# Patient Record
Sex: Female | Born: 1985 | Race: White | Hispanic: No | Marital: Single | Smoking: Current every day smoker
Health system: Southern US, Community
[De-identification: ages and names within clinical notes are randomized; demographics above are authoritative.]

## PROBLEM LIST (undated history)

## (undated) ENCOUNTER — Inpatient Hospital Stay (HOSPITAL_COMMUNITY): Payer: Self-pay

## (undated) DIAGNOSIS — M797 Fibromyalgia: Secondary | ICD-10-CM

## (undated) DIAGNOSIS — I10 Essential (primary) hypertension: Secondary | ICD-10-CM

## (undated) DIAGNOSIS — IMO0002 Reserved for concepts with insufficient information to code with codable children: Secondary | ICD-10-CM

## (undated) DIAGNOSIS — N83209 Unspecified ovarian cyst, unspecified side: Secondary | ICD-10-CM

## (undated) DIAGNOSIS — Z789 Other specified health status: Secondary | ICD-10-CM

## (undated) HISTORY — DX: Other specified health status: Z78.9

## (undated) HISTORY — DX: Unspecified ovarian cyst, unspecified side: N83.209

---
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
## 2004-02-23 ENCOUNTER — Emergency Department (HOSPITAL_COMMUNITY): Admission: EM | Admit: 2004-02-23 | Discharge: 2004-02-23 | Payer: Self-pay | Admitting: Emergency Medicine

## 2005-09-24 ENCOUNTER — Emergency Department (HOSPITAL_COMMUNITY): Admission: EM | Admit: 2005-09-24 | Discharge: 2005-09-25 | Payer: Self-pay | Admitting: Emergency Medicine

## 2006-04-05 ENCOUNTER — Inpatient Hospital Stay (HOSPITAL_COMMUNITY): Admission: EM | Admit: 2006-04-05 | Discharge: 2006-04-07 | Payer: Self-pay | Admitting: Emergency Medicine

## 2006-04-07 ENCOUNTER — Inpatient Hospital Stay (HOSPITAL_COMMUNITY): Admission: AD | Admit: 2006-04-07 | Discharge: 2006-04-11 | Payer: Self-pay | Admitting: Psychiatry

## 2006-04-08 ENCOUNTER — Ambulatory Visit: Payer: Self-pay | Admitting: Psychiatry

## 2007-09-06 ENCOUNTER — Inpatient Hospital Stay (HOSPITAL_COMMUNITY): Admission: AD | Admit: 2007-09-06 | Discharge: 2007-09-06 | Payer: Self-pay | Admitting: Obstetrics & Gynecology

## 2007-11-09 ENCOUNTER — Inpatient Hospital Stay (HOSPITAL_COMMUNITY): Admission: AD | Admit: 2007-11-09 | Discharge: 2007-11-09 | Payer: Self-pay | Admitting: Obstetrics & Gynecology

## 2007-12-20 ENCOUNTER — Inpatient Hospital Stay (HOSPITAL_COMMUNITY): Admission: AD | Admit: 2007-12-20 | Discharge: 2007-12-20 | Payer: Self-pay | Admitting: Obstetrics & Gynecology

## 2007-12-20 ENCOUNTER — Ambulatory Visit: Payer: Self-pay | Admitting: Obstetrics and Gynecology

## 2008-04-05 ENCOUNTER — Ambulatory Visit: Payer: Self-pay | Admitting: Family Medicine

## 2008-04-05 ENCOUNTER — Inpatient Hospital Stay (HOSPITAL_COMMUNITY): Admission: AD | Admit: 2008-04-05 | Discharge: 2008-04-05 | Payer: Self-pay | Admitting: Obstetrics & Gynecology

## 2008-04-11 ENCOUNTER — Inpatient Hospital Stay (HOSPITAL_COMMUNITY): Admission: AD | Admit: 2008-04-11 | Discharge: 2008-04-11 | Payer: Self-pay | Admitting: Obstetrics & Gynecology

## 2008-04-11 ENCOUNTER — Ambulatory Visit: Payer: Self-pay | Admitting: Family Medicine

## 2008-04-12 ENCOUNTER — Inpatient Hospital Stay (HOSPITAL_COMMUNITY): Admission: AD | Admit: 2008-04-12 | Discharge: 2008-04-12 | Payer: Self-pay | Admitting: Obstetrics & Gynecology

## 2008-04-12 ENCOUNTER — Ambulatory Visit: Payer: Self-pay | Admitting: Physician Assistant

## 2008-04-13 ENCOUNTER — Inpatient Hospital Stay (HOSPITAL_COMMUNITY): Admission: AD | Admit: 2008-04-13 | Discharge: 2008-04-13 | Payer: Self-pay | Admitting: Obstetrics & Gynecology

## 2008-04-14 ENCOUNTER — Inpatient Hospital Stay (HOSPITAL_COMMUNITY): Admission: AD | Admit: 2008-04-14 | Discharge: 2008-04-17 | Payer: Self-pay | Admitting: Obstetrics & Gynecology

## 2008-04-14 ENCOUNTER — Ambulatory Visit: Payer: Self-pay | Admitting: Advanced Practice Midwife

## 2008-06-22 ENCOUNTER — Emergency Department (HOSPITAL_COMMUNITY): Admission: EM | Admit: 2008-06-22 | Discharge: 2008-06-22 | Payer: Self-pay | Admitting: Emergency Medicine

## 2008-08-15 ENCOUNTER — Inpatient Hospital Stay (HOSPITAL_COMMUNITY): Admission: AD | Admit: 2008-08-15 | Discharge: 2008-08-15 | Payer: Self-pay | Admitting: Obstetrics & Gynecology

## 2008-08-15 ENCOUNTER — Ambulatory Visit: Payer: Self-pay | Admitting: Obstetrics and Gynecology

## 2008-12-25 ENCOUNTER — Emergency Department (HOSPITAL_COMMUNITY): Admission: EM | Admit: 2008-12-25 | Discharge: 2008-12-25 | Payer: Self-pay | Admitting: Emergency Medicine

## 2010-02-07 ENCOUNTER — Emergency Department (HOSPITAL_COMMUNITY)
Admission: EM | Admit: 2010-02-07 | Discharge: 2010-02-07 | Payer: Self-pay | Source: Home / Self Care | Admitting: Emergency Medicine

## 2010-02-07 LAB — RAPID STREP SCREEN (MED CTR MEBANE ONLY): Streptococcus, Group A Screen (Direct): NEGATIVE

## 2010-02-07 LAB — POCT PREGNANCY, URINE: Preg Test, Ur: NEGATIVE

## 2010-05-08 LAB — WET PREP, GENITAL
Clue Cells Wet Prep HPF POC: NONE SEEN
Trich, Wet Prep: NONE SEEN
Yeast Wet Prep HPF POC: NONE SEEN

## 2010-05-08 LAB — URINALYSIS, ROUTINE W REFLEX MICROSCOPIC
Bilirubin Urine: NEGATIVE
Glucose, UA: NEGATIVE mg/dL
Hgb urine dipstick: NEGATIVE
Ketones, ur: NEGATIVE mg/dL
Nitrite: NEGATIVE
Protein, ur: NEGATIVE mg/dL
Specific Gravity, Urine: 1.006 (ref 1.005–1.030)
Urobilinogen, UA: 0.2 mg/dL (ref 0.0–1.0)
pH: 6.5 (ref 5.0–8.0)

## 2010-05-08 LAB — URINE MICROSCOPIC-ADD ON

## 2010-05-08 LAB — GC/CHLAMYDIA PROBE AMP, GENITAL
Chlamydia, DNA Probe: POSITIVE — AB
GC Probe Amp, Genital: NEGATIVE

## 2010-05-12 LAB — URINALYSIS, ROUTINE W REFLEX MICROSCOPIC
Glucose, UA: NEGATIVE mg/dL
Ketones, ur: 15 mg/dL — AB
Leukocytes, UA: NEGATIVE
Nitrite: NEGATIVE
Protein, ur: NEGATIVE mg/dL
Specific Gravity, Urine: >1.03 — ABNORMAL HIGH (ref 1.005–1.030)
Urobilinogen, UA: 0.2 mg/dL (ref 0.0–1.0)
pH: 6 (ref 5.0–8.0)

## 2010-05-12 LAB — CBC
HCT: 40.4 % (ref 36.0–46.0)
Hemoglobin: 13.9 g/dL (ref 12.0–15.0)
MCHC: 34.5 g/dL (ref 30.0–36.0)
MCV: 91.3 fL (ref 78.0–100.0)
Platelets: 227 10*3/uL (ref 150–400)
RBC: 4.42 MIL/uL (ref 3.87–5.11)
RDW: 13.4 % (ref 11.5–15.5)
WBC: 8.9 10*3/uL (ref 4.0–10.5)

## 2010-05-12 LAB — URINE MICROSCOPIC-ADD ON

## 2010-05-12 LAB — WET PREP, GENITAL
Clue Cells Wet Prep HPF POC: NONE SEEN
Trich, Wet Prep: NONE SEEN
Yeast Wet Prep HPF POC: NONE SEEN

## 2010-05-12 LAB — GC/CHLAMYDIA PROBE AMP, GENITAL
Chlamydia, DNA Probe: POSITIVE — AB
GC Probe Amp, Genital: NEGATIVE

## 2010-05-12 LAB — POCT PREGNANCY, URINE: Preg Test, Ur: NEGATIVE

## 2010-05-16 LAB — CBC
HCT: 34.9 % — ABNORMAL LOW (ref 36.0–46.0)
Hemoglobin: 11.8 g/dL — ABNORMAL LOW (ref 12.0–15.0)
MCHC: 33.9 g/dL (ref 30.0–36.0)
MCV: 92.6 fL (ref 78.0–100.0)
Platelets: 202 10*3/uL (ref 150–400)
RBC: 3.77 MIL/uL — ABNORMAL LOW (ref 3.87–5.11)
RDW: 13.8 % (ref 11.5–15.5)
WBC: 9.6 10*3/uL (ref 4.0–10.5)

## 2010-05-16 LAB — TYPE AND SCREEN
ABO/RH(D): O POS
Antibody Screen: NEGATIVE

## 2010-05-16 LAB — RAPID URINE DRUG SCREEN, HOSP PERFORMED
Amphetamines: NOT DETECTED
Benzodiazepines: NOT DETECTED
Cocaine: NOT DETECTED

## 2010-05-16 LAB — STREP B DNA PROBE: Strep Group B Ag: NEGATIVE

## 2010-05-16 LAB — RPR
RPR Ser Ql: NONREACTIVE
RPR Ser Ql: NONREACTIVE

## 2010-05-16 LAB — RUBELLA SCREEN: Rubella: 60.9 IU/mL — ABNORMAL HIGH

## 2010-05-16 LAB — HEPATITIS B SURFACE ANTIGEN: Hepatitis B Surface Ag: NEGATIVE

## 2010-06-21 NOTE — Op Note (Signed)
NAMEMESSIAH, ROVIRA               ACCOUNT NO.:  [id number]   MEDICAL RECORD NO.:  [id number]          PATIENT TYPE:  INP   LOCATION:  9134                          FACILITY:  [facility]   PHYSICIAN:  Gerrit Friends. Aldona Bar, M.D.   DATE OF BIRTH:  Aug 06, 1985   DATE OF PROCEDURE:  12/28/2003  DATE OF DISCHARGE:                                 OPERATIVE REPORT   PREOPERATIVE DIAGNOSIS:  Term pregnancy, labor with slow progress and  possible nonreassuring fetal heart tracing.   POSTOPERATIVE DIAGNOSIS:  Term pregnancy, labor with slow progress and  possible nonreassuring fetal heart tracing.  Delivery of 6 pound 13 ounce  female infant, Apgars 9 and 9.  Arterial cord pH 7.29.   OPERATION PERFORMED:  Primary low transverse cesarean section.   SURGEON:  Gerrit Friends. Aldona Bar, M.D.   ASSISTANT:  Ilda Mori, M.D.   ANESTHESIA:  Epidural.   INDICATIONS FOR PROCEDURE:  This 25 year old gravida 1 presented at term on  the afternoon of December 27, 2003 in early labor.  At that time she was  approximately 3 cm dilated.  On the evening of December 27, 2003 she  underwent amniotomy with production of clear fluid and ultimately required  Pitocin augmentation of labor but continued to make slow progress.  Her  fetal heart tracing with a scalp electrode had what looked like  hypervariability.  There were some occasional decelerations of the fetal  heart rate with and after contractions.  In general, the fetal heart tracing  did not appear totally reassuring to me and with the patient slow progress  (she was at best 6 cm at 7:30 on December 28, 2003), decision was made to  proceed with a primary low transverse cesarean section.  The patient was  taken to the operating room with her epidural and Foley catheter in place.   DESCRIPTION OF PROCEDURE:  Once in the operating room the epidural was  augmented and she was prepped and draped in the usual fashion for cesarean  section having been placed in supine position  fully tilted to the left.  Scalp electrode was removed.  After the patient was adequately draped after  her prep, good anesthetic levels were documented and the procedure was  thereafter begun.  A Pfannenstiel incision was made with minimal difficulty  and dissected down sharply to and through the fascia in low transverse  fashion with hemostasis created at each layer.  The peritoneum was  identified and entered appropriately with care taken to avoid the bowel and  bladder superiorly and inferiorly.  A bladder blade was placed,  vesicouterine peritoneum was identified and the vesicouterine peritoneum was  incised in low transverse fashion using Metzenbaum scissors and pushed off  the lower uterine segment with ease.  Sharp incisions using Metzenbaum  scissors was then carried out in low transverse fashion and extended  laterally with fingers.  Remaining amniotic fluid was clear.  Thereafter  with minimal difficulty  from a vertex position, a viable 6 pound 13 ounce  female infant which cried spontaneously as once was delivered.  There was a  nuchal  cord times one.  After the baby was delivered, the cord was clamped  and cut.  Subsequent Apgars were found to be 9 and 9.  An arterial cord pH  was obtained and was 7.29.  After other cord bloods were obtained, the  placenta was delivered intact and the placenta was sent to pathology labeled  appropriately.  Thereafter, the uterus was delivered from the abdominal  incision and with manual stimulation and intravenous Pitocin, contracted  well.  The cavity was cleansed of all remaining products of conception.  With good uterine contractility noted at this time, closure of the uterine  incision was carried out using a single layer of #1 Vicryl in a running  locking fashion.  Incision was dry, the bladder flap was reapproximated  using 2-0 Vicryl in an interrupted fashion.  Tubes and ovaries noted to be  normal.  After the abdomen was lavaged of all  free blood and clots, the  uterus was replaced in the abdominal incision and closure of the abdomen was  begun in layers after all counts were noted to be correct and no foreign  bodies were noted to be remaining in the abdominal cavity.   The abdominal peritoneum was closed with 0 Vicryl in a running fashion,  muscles secured with same.  After assuring hemostasis, the fascia was  reapproximated using 0 Vicryl from angle to midline bilaterally.  Subcutaneous tissue was rendered hemostatic and staples were then used to  close the skin.  A sterile pressure dressing was applied.  The patient at  this time was taken to the recovery room in satisfactory condition having  tolerated the procedure well.  The estimated blood loss 500 mL.  All counts  correct times two.  In summary, this patient presented with a possible  nonreassuring fetal heart tracing coupled with the fact that she was making  very slow progress during her labor in spite of Pitocin augmentation.  She  was taken to the operating room and was delivered of a 6 pound 13 ounce female  infant with Apgars of 9 and 9 and arterial cord pH of 7.29.  There was a  nuchal cord times one.  At the conclusion of the procedure, both mother and  baby were doing well in their respective recovery areas.      RMW/MEDQ  D:  12/28/2003  T:  12/28/2003  Job:  [id number]

## 2010-06-21 NOTE — Discharge Summary (Signed)
NAME:  Monique Lyons, Monique Lyons NO.:  [id number]   MEDICAL RECORD NO.:  [id number]          PATIENT TYPE:  INP   LOCATION:  9134                          FACILITY:  [facility]   PHYSICIAN:  Miguel Aschoff, M.D.       DATE OF BIRTH:  [birth year]   DATE OF ADMISSION:  12/27/2003  DATE OF DISCHARGE:  12/31/2003                                 DISCHARGE SUMMARY   FINAL DIAGNOSES:  Intrauterine pregnancy at term, labor with slow progress  and possible nonreassuring fetal heart tracing.   PROCEDURE:  Primary low transverse cesarean section.   SURGEON:  Gerrit Friends. Aldona Bar, M.D.   ASSISTANT:  Ilda Mori, M.D.   COMPLICATIONS:  None.   This 25 year old, G1, P0 presents at term on the afternoon of November 23 in  early labor. The patient was about 3 cm dilated at this time. Amniotomy was  performed with production of clear fluid. Pitocin augmentation was begun but  patient made slow progress.  Scalp electrodes were placed but showed some  hypervariability and there were some occasional decelerations of fetal heart  rate after each contraction.  Because of the slow progress and a not  completely normal fetal heart rate tracing, a decision was made to proceed  with a cesarean section. The patient was taken to the operating room on  December 28, 2003 by Gerrit Friends. Aldona Bar, M.D. where a primary low transverse  cesarean section was performed with the delivery of a 6 pound 13 ounce female  infant with Apgar's of 9 and 9, delivery went without complications. The  patient's postoperative course was benign without significant fevers.  She  was felt ready for discharge on postoperative day #3. She was sent home on a  regular diet, told to decrease activities, told to continue prenatal  vitamins and was given Tylox 1-2 every 3 hours as needed for pain. She is to  call with and increased fevers, __________ or bleeding and was to followup  in the office in 4-5 weeks.   DISCHARGE LABS:  The patient had a  hemoglobin of 10.3, white blood cell  count of 16.5.      MB/MEDQ  D:  02/01/2004  T:  02/01/2004  Job:  [id number]

## 2010-06-21 NOTE — Discharge Summary (Signed)
Monique Lyons, Monique Lyons               ACCOUNT NO.:  [id number]   MEDICAL RECORD NO.:  [id number]          PATIENT TYPE:  INP   LOCATION:  2005                         FACILITY:  [facility]   PHYSICIAN:  Kela Millin, M.D.DATE OF BIRTH:  [birth year]   DATE OF ADMISSION:  04/05/2006  DATE OF DISCHARGE:                               DISCHARGE SUMMARY   DISCHARGE DIAGNOSES:  1. Tylenol overdose.  2. Major depression/suicide attempt.  3. Recent history of Chlamydia infection.  4. History of ovarian cyst.   CONSULTATIONS:  Psychiatry, Dr. Jeanie Sewer.   HISTORY:  The patient is a 25 year old white female with past medical  history significant for previous suicide attempt who presented following  a Tylenol overdose.  The patient family reported that at 7:30 p.m. on  the day of admission the patient took four Tylenol PM capsules along  with a small amount of liquid Tylenol in a suicide attempt.  She  reported that she was trying to kill herself.  She had also inflicted a  superficial, horizontal laceration to her right wrist.  In the ER the  patient was initially slightly somnolent and appeared to be withdrawn  more than anything.  Lab work in the ER revealed normal transaminases as  well as salicylate level and Poison Control recommended doing a four-  hour post ingestion of Tylenol level and this was done and it was mildly  elevated at 58.2, and another repeat level was recommended.  The  patient's complained of mild headache and denied any other complaints  and already mentioned was noted to be very withdrawn.   Physical examination by Dr. Rito Ehrlich revealed a temperature of 98.7,  pulse 88, blood pressure 140/97, respiratory rate 12, oxygen saturation  99 on room air.  Pertinent findings on exam showed mucous membranes  slightly dry.  Upper extremities show superficial lacerations on her  right wrist with a dressing applied in the ER.   LABORATORY DATA:  Salicylate level was less than 4.   Alcohol level  normal.  Urine drug screen negative.  White cell count of 15.4 with no  shifts.  Hemoglobin 14.7.  Hematocrit 42.6.  Platelet count 285,000.  LFTs were unremarkable.  Point-of-care markers also unremarkable.  Tylenol level 58.2 as above.   HOSPITAL COURSE:  Problem #1.  Tylenol overdose.  Upon admission, Poison  Control was contacted as already discussed and they recommended a four-  hour level which was done and it was mildly elevated at 58.2.  Poison  Control stated that, though elevated, this level did not require  Mucomyst but that another four-hour level be done and that if it was  increased, the patient should be started on Mucomyst.  The recommended  level was done and it was less than 10, no Mucomyst was given.  The  patient's LFTs/transaminases and bilirubin also were repeated and  remained within normal limits.  Psychiatry was consulted and Dr.  Jeanie Sewer saw the patient and recommended inpatient psychiatry admission  as he stated that the patient was still a suicide risk.   Problem #2.  Major depression/suicide attempt.  As  already mentioned,  this was the patient's second suicide attempt and psychiatry followup  following evaluation as stated the patient is still a suicide risk and  recommended inpatient psychiatry admission.  The patient is medically  cleared at this time and is awaiting a bed in a psychiatric facility.  The patient is still on suicide precautions at this time with one on one  observation.   Problem #3.  Recent Chlamydia infection.  The patient reported while in  the hospital that she had been diagnosed with Chlamydia infection a  month ago at the [facility] and was treated at that time.  She  also reported that she was having some of the same symptoms that she had  had at the time that she was diagnosed although she denied any vaginal  discharge.  She stated that her partner had been treated at the time as  well but she was unsure  if he had become reinfected.  The patient was  empirically treated with Rocephin and started doxycycline. She will need  a total of seven days of doxycycline 100 mg p.o. b.i.d.  She has  remained afebrile.  The patient's partner is also to be treated although  the patient indicates that she has broken up with the partner at this  time.   Problem #4.  History of ovarian cyst.  The patient to continue p.r.n.  Motrin and outpatient followup with her primary care physician when  discharged from inpatient psychiatry.   DISCHARGE MEDICATIONS:  1. Doxycycline 100 mg p.o. b.i.d. to complete a total of seven days.  2. Motrin 400 mg p.o. t.i.d. p.r.n. with food.   FOLLOW UP:  The patient will be transferred to inpatient psychiatry.  Follow up with primary care physician as scheduled.      Kela Millin, M.D.  Electronically Signed     ACV/MEDQ  D:  04/07/2006  T:  04/07/2006  Job:  [id number]   cc:   Miguel Aschoff, M.D.

## 2010-06-21 NOTE — H&P (Signed)
Monique Lyons, CLASBY NO.:  [id number]   MEDICAL RECORD NO.:  [id number]          PATIENT TYPE:  EMS   LOCATION:  MAJO                         FACILITY:  [facility]   PHYSICIAN:  Hollice Espy, M.D.DATE OF BIRTH:  16-Dec-1985   DATE OF ADMISSION:  04/05/2006  DATE OF DISCHARGE:                              HISTORY & PHYSICAL   PRIMARY CARE PHYSICIAN:  C. Duane Lope, M.D.   CONSULTATIONS:  Antonietta Breach, M.D., psychiatry.   CHIEF COMPLAINT:  Tylenol overdose.   HISTORY OF PRESENT ILLNESS:  The patient is a 25 year old white female  with past medical history of previous suicide attempts, who according to  the family about 7:30 this evening took four Tylenol PM capsules along  with a small amount of liquid Tylenol in an attempt.  She said she was  trying to kill herself.  She also inflicted a superficial horizontal  laceration on her right wrist.  Her family brought her into the  emergency room.  The patient initially was slightly somnolent, but she  appeared to be more withdrawn than anything.  Labs were ordered on the  patient and she was noted to have an elevated white count of 15.4 with  the rest of her lab work and transaminases, electrolytes, salicylate  levels, and urine drug screen were negative.  As per poison control  recommendations, initial Tylenol level has not been done yet and needs  to be done 4 hours post ingestion, of which there is still 1 hour left  before that is done.  The patient in the meantime, however, is alert and  awake, however, very withdrawn, saying only a few words.  She complains  of some mild shortness of breath and does complain of a mild headache.  She tells me that she is not really hurting anywhere.  She denies any  abdominal pain and no nausea and vomiting.  She does not give me much of  review of systems, other than that secondary to be very withdrawn.   PAST MEDICAL HISTORY:  Previous multiple suicide attempts.   MEDICATIONS:  She is on p.r.n. Motrin.   ALLERGIES:  PENICILLIN.   FAMILY HISTORY:  Noncontributory.   PHYSICAL EXAMINATION:  VITAL SIGNS:  Temperature 98.7, heart rate 88,  blood pressure 140/97, respirations 12, O2 saturation 99% on room air.  GENERAL:  The patient is alert and oriented x3.  HEENT:  Normocephalic and atraumatic.  Mucous membranes are slightly  dry.  She has no carotid bruits.  HEART:  Regular rate and rhythm.  S1 and S2.  LUNGS:  Clear to auscultation bilaterally.  ABDOMEN:  Soft, nontender, and nondistended with positive bowel sounds.  EXTREMITIES:  She has a superficial laceration on her wrist which has  been dressed by ER.  No cyanosis, clubbing, or edema.   LABORATORY DATA:  Salicylate level and alcohol levels are normal.  Urine  drug screen normal.  White count 15.4 with no shift.  H&H 14.7 and 42.6,  MCV 89, platelet count 285.  LFT's are unremarkable.  Cardiac markers  are unremarkable.  Serum drug screen is  pending.  Tylenol level is due  in 45 minutes, which will make a 4-hour post ingestion marker.   ASSESSMENT:  1. Intentional Tylenol overdose.  I have spoken with behavioral health      who will consult Dr. Jeanie Sewer, psychiatry to see her.  In the      meantime, we will put her on suicide precautions and pending      Tylenol level, we will treat with the Mucomyst, although it appears      from what the family and the patient describe that she has taken      very little Tylenol ingestion.  She may require more monitoring.      We will defer all attempts at psychotropic and behavioral health      intervention to behavioral health.  2. Leukocytosis likely secondary to #1.      Hollice Espy, M.D.  Electronically Signed     SKK/MEDQ  D:  04/05/2006  T:  04/05/2006  Job:  [id number]   cc:   Antonietta Breach, M.D.  Vianne Bulls, M.D.

## 2010-06-21 NOTE — H&P (Signed)
NAME:  Monique Lyons, Monique Lyons NO.:  [id number]   MEDICAL RECORD NO.:  [id number]          PATIENT TYPE:  IPS   LOCATION:  0302                          FACILITY:  [facility]   PHYSICIAN:  Anselm Jungling, MD  DATE OF BIRTH:  1985-06-08   DATE OF ADMISSION:  04/07/2006  DATE OF DISCHARGE:                       PSYCHIATRIC ADMISSION ASSESSMENT   HISTORY OF PRESENT ILLNESS:  The patient presents with a history of self-  inflicted injury cutting herself with a piece of glass and a knife with  laceration requiring stitches.  The patient also overdosed on four  Tylenol tablets with her intention to kill herself.  She reports she has  been having problems with her boyfriend who left her with her 2-year-  old.  Boyfriend is the father of this child.  She states because he was  unable to provide financially for them.  She is herself also unable to  work due to transportation issues.  She is glad that she is here, that  there are people who understand her, she promises safety on the unit.  Denies any substance abuse issues.   PAST PSYCHIATRIC HISTORY:  First admission to [facility].  Has a history of cutting in the past, but states it was more for  attention.   SOCIAL HISTORY:  She is a 25 year old single white female.  Has a 2-year-  old son.  She lives with her son.  She is unemployed.  The child is  currently with her boyfriend's mother whom she gets along well with.  No  legal problems.  The patient has a supportive sister and father.   FAMILY HISTORY:  None.   ALCOHOL AND DRUG HISTORY:  The patient smokes.  Denies any alcohol or  drug use.   PRIMARY CARE Elyse Prevo:  None.   MEDICAL PROBLEMS:  None.   MEDICATIONS:  Takes vitamins and occasional Motrin or aspirin for  headaches.   DRUG ALLERGIES:  AMOXICILLIN.   PHYSICAL EXAMINATION:  The patient was fully assessed at [facility] after her overdose.  She presents today.  She is a young  female.   She has a horizontal laceration with three sutures in place.  There is no redness.  No bleeding to her left wrist pain.   LABORATORY DATA:  Her CBC is within normal.  Her C-MET is within normal  limits.  Her pregnancy test is negative.  Urine drug screen is negative.  Her acetaminophen level was initially high at 58.2, down to 10 prior to  transfer.  Salicylate level less than 4.  Alcohol level was less than 5.   VITAL SIGNS:  Temperature is 98.5, 54 heart rate, 18 respirations, blood  pressure 118/72, 5 feet 9 inches tall, 153 pounds.   MENTAL STATUS EXAM:  She is fully alert, cooperative, fair eye contact.  She is casually dressed.  Her speech is clear and normal pace and tone.  The patient's mood is neutral.  The patient is very appreciative of help  she has been receiving.  Very pleasant.  Thought processes are coherent.  No evidence of psychosis.  Cognitive intact.  Memory is good.  Judgment  is fair.  Insight is fair.   IMPRESSION:  AXIS I:  Major depressive disorder with status post  overdose.  AXIS II:  Deferred.  AXIS III:  1. Chlamydia infection.  2. Acetaminophen overdose.  AXIS IV:  Problems with primary support group, occupation, other  psychosocial problems, problems with economic issues.  AXIS V:  Current is 35.   PLAN:  Contract for safety.  Stabilize mood and thinking.  We will  continue with a week's course of doxycycline for STD.  We will monitor  her wound for signs and symptoms of infection.  Assess wound in 7 days  for suture removal.  Will have a family session with the patient's  support group.  Case manager will obtain her follow-up.  Tentative  length of stay is 3-4 days.      Landry Corporal, N.P.      Anselm Jungling, MD  Electronically Signed    JO/MEDQ  D:  04/08/2006  T:  04/08/2006  Job:  [id number]

## 2010-08-05 ENCOUNTER — Emergency Department (HOSPITAL_COMMUNITY)
Admission: EM | Admit: 2010-08-05 | Discharge: 2010-08-06 | Disposition: A | Discharge status: Home / Self Care | Payer: Medicaid Other | Attending: Emergency Medicine | Admitting: Emergency Medicine

## 2010-08-05 DIAGNOSIS — F3130 Bipolar disorder, current episode depressed, mild or moderate severity, unspecified: Secondary | ICD-10-CM | POA: Insufficient documentation

## 2010-08-05 DIAGNOSIS — R109 Unspecified abdominal pain: Secondary | ICD-10-CM | POA: Insufficient documentation

## 2010-08-05 DIAGNOSIS — R110 Nausea: Secondary | ICD-10-CM | POA: Insufficient documentation

## 2010-08-05 DIAGNOSIS — R42 Dizziness and giddiness: Secondary | ICD-10-CM | POA: Insufficient documentation

## 2010-08-05 DIAGNOSIS — R404 Transient alteration of awareness: Secondary | ICD-10-CM | POA: Insufficient documentation

## 2010-08-05 DIAGNOSIS — O99891 Other specified diseases and conditions complicating pregnancy: Secondary | ICD-10-CM | POA: Insufficient documentation

## 2010-08-05 DIAGNOSIS — N898 Other specified noninflammatory disorders of vagina: Secondary | ICD-10-CM | POA: Insufficient documentation

## 2010-08-05 LAB — DIFFERENTIAL
Basophils Relative: 0 % (ref 0–1)
Eosinophils Absolute: 0.1 10*3/uL (ref 0.0–0.7)
Monocytes Absolute: 0.6 10*3/uL (ref 0.1–1.0)
Monocytes Relative: 6 % (ref 3–12)
Neutrophils Relative %: 69 % (ref 43–77)

## 2010-08-05 LAB — URINALYSIS, ROUTINE W REFLEX MICROSCOPIC
Ketones, ur: 15 mg/dL — AB
Leukocytes, UA: NEGATIVE
Nitrite: NEGATIVE
Protein, ur: NEGATIVE mg/dL

## 2010-08-05 LAB — BASIC METABOLIC PANEL
Calcium: 9.2 mg/dL (ref 8.4–10.5)
Creatinine, Ser: <0.47 mg/dL — ABNORMAL LOW (ref 0.50–1.10)

## 2010-08-05 LAB — CBC
MCH: 30.2 pg (ref 26.0–34.0)
MCHC: 34.2 g/dL (ref 30.0–36.0)
Platelets: 207 10*3/uL (ref 150–400)
RBC: 4.43 MIL/uL (ref 3.87–5.11)

## 2010-08-05 LAB — POCT PREGNANCY, URINE: Preg Test, Ur: POSITIVE

## 2010-08-06 ENCOUNTER — Emergency Department (HOSPITAL_COMMUNITY): Payer: Medicaid Other

## 2010-08-06 LAB — GC/CHLAMYDIA PROBE AMP, GENITAL: GC Probe Amp, Genital: NEGATIVE

## 2010-08-06 LAB — ABO/RH: ABO/RH(D): O POS

## 2010-11-01 LAB — POCT PREGNANCY, URINE: Preg Test, Ur: POSITIVE

## 2010-11-04 LAB — CBC
MCV: 93.8
RBC: 3.69 — ABNORMAL LOW
WBC: 11.6 — ABNORMAL HIGH

## 2010-11-04 LAB — URINALYSIS, ROUTINE W REFLEX MICROSCOPIC
Glucose, UA: NEGATIVE
Ketones, ur: NEGATIVE
Protein, ur: NEGATIVE

## 2010-11-04 LAB — WET PREP, GENITAL

## 2010-11-04 LAB — GC/CHLAMYDIA PROBE AMP, GENITAL: Chlamydia, DNA Probe: NEGATIVE

## 2010-11-05 LAB — URINALYSIS, ROUTINE W REFLEX MICROSCOPIC
Glucose, UA: NEGATIVE
Ketones, ur: 40 — AB
Specific Gravity, Urine: 1.01
pH: 8.5 — ABNORMAL HIGH

## 2010-11-05 LAB — DIFFERENTIAL
Basophils Absolute: 0
Lymphocytes Relative: 16
Lymphs Abs: 1.9
Neutro Abs: 9.2 — ABNORMAL HIGH
Neutrophils Relative %: 78 — ABNORMAL HIGH

## 2010-11-05 LAB — KLEIHAUER-BETKE STAIN: Quantitation Fetal Hemoglobin: 0

## 2010-11-05 LAB — CBC
Platelets: 229
RDW: 12.9
WBC: 11.7 — ABNORMAL HIGH

## 2010-11-05 LAB — WET PREP, GENITAL: Clue Cells Wet Prep HPF POC: NONE SEEN

## 2010-11-05 LAB — TYPE AND SCREEN
ABO/RH(D): O POS
Antibody Screen: NEGATIVE

## 2010-11-05 LAB — RPR: RPR Ser Ql: NONREACTIVE

## 2010-11-05 LAB — HIV ANTIBODY (ROUTINE TESTING W REFLEX): HIV: NONREACTIVE

## 2010-11-05 LAB — URINE MICROSCOPIC-ADD ON

## 2010-11-05 LAB — HEPATITIS B SURFACE ANTIGEN: Hepatitis B Surface Ag: NEGATIVE

## 2010-11-05 LAB — RUBELLA SCREEN: Rubella: 61.9 — ABNORMAL HIGH

## 2010-11-05 LAB — ABO/RH: ABO/RH(D): O POS

## 2010-11-05 LAB — GC/CHLAMYDIA PROBE AMP, GENITAL: Chlamydia, DNA Probe: NEGATIVE

## 2010-12-15 ENCOUNTER — Encounter: Payer: Self-pay | Admitting: Emergency Medicine

## 2010-12-15 ENCOUNTER — Emergency Department (HOSPITAL_COMMUNITY)
Admission: EM | Admit: 2010-12-15 | Discharge: 2010-12-15 | Disposition: A | Discharge status: Home / Self Care | Payer: Medicaid Other | Attending: Emergency Medicine | Admitting: Emergency Medicine

## 2010-12-15 DIAGNOSIS — O99891 Other specified diseases and conditions complicating pregnancy: Secondary | ICD-10-CM | POA: Insufficient documentation

## 2010-12-15 DIAGNOSIS — H538 Other visual disturbances: Secondary | ICD-10-CM | POA: Insufficient documentation

## 2010-12-15 DIAGNOSIS — R42 Dizziness and giddiness: Secondary | ICD-10-CM | POA: Insufficient documentation

## 2010-12-15 DIAGNOSIS — F17200 Nicotine dependence, unspecified, uncomplicated: Secondary | ICD-10-CM | POA: Insufficient documentation

## 2010-12-15 LAB — COMPREHENSIVE METABOLIC PANEL
ALT: 13 U/L (ref 0–35)
AST: 15 U/L (ref 0–37)
Albumin: 2.8 g/dL — ABNORMAL LOW (ref 3.5–5.2)
Calcium: 8.4 mg/dL (ref 8.4–10.5)
Chloride: 105 mEq/L (ref 96–112)
Creatinine, Ser: 0.54 mg/dL (ref 0.50–1.10)
Sodium: 137 mEq/L (ref 135–145)

## 2010-12-15 LAB — PROTEIN / CREATININE RATIO, URINE
Creatinine, Urine: 132.02 mg/dL
Total Protein, Urine: 8.4 mg/dL

## 2010-12-15 LAB — CBC
MCH: 30 pg (ref 26.0–34.0)
MCV: 90.4 fL (ref 78.0–100.0)
Platelets: 221 10*3/uL (ref 150–400)
RBC: 3.63 MIL/uL — ABNORMAL LOW (ref 3.87–5.11)
RDW: 12.9 % (ref 11.5–15.5)
WBC: 9.4 10*3/uL (ref 4.0–10.5)

## 2010-12-15 LAB — URINALYSIS, ROUTINE W REFLEX MICROSCOPIC
Glucose, UA: NEGATIVE mg/dL
Hgb urine dipstick: NEGATIVE
Leukocytes, UA: NEGATIVE
pH: 7.5 (ref 5.0–8.0)

## 2010-12-15 NOTE — ED Provider Notes (Signed)
History     CSN: [id number] Arrival date & time: 12/15/2010 11:58 AM   First MD Initiated Contact with Patient 12/15/10 1227      Chief Complaint  Patient presents with  . Blurred Vision  . Dizziness    (Consider location/radiation/quality/duration/timing/severity/associated sxs/prior treatment) HPI This G3P2 female now presents with concerns of blurred vision. She notes that her last menstrual period was about 7 months ago, and has had one ER visit she has had no prenatal care for this pregnancy. She now presents with concerns of her visual changes. She states that her changes began gradually 2 weeks ago, and has been more present over the interval.. She describes vision, particularly with prolonged watching of TV. No loss of vision, no pain with eye motion or light. No headache, no disorientation. No clear alleviating or exacerbating factors. The patient also complains of intermittent cramps preeminently in her legs. No fever, no chills, no vaginal discharge, vaginal bleeding, no sustained intermittent contractions upper abdomen. History reviewed. No pertinent past medical history.  Past Surgical History  Procedure Date  . Cesarean section     first was c-section; second vbac    History reviewed. No pertinent family history.  History  Substance Use Topics  . Smoking status: Current Some Day Smoker  . Smokeless tobacco: Not on file  . Alcohol Use: No    OB History    Grav Para Term Preterm Abortions TAB SAB Ect Mult Living   4 2 2  1     2       Review of Systems  All other systems reviewed and are negative.    Allergies  Amoxicillin and Penicillins  Home Medications  No current outpatient prescriptions on file.  BP 107/64  Pulse 73  Temp(Src) 98 F (36.7 C) (Oral)  Resp 15  SpO2 96%  Physical Exam  Constitutional: She is oriented to person, place, and time. She appears well-developed and well-nourished. No distress.  HENT:  Head: Normocephalic and  atraumatic.  Eyes: Conjunctivae and EOM are normal. Pupils are equal, round, and reactive to light. Right eye exhibits no discharge. Left eye exhibits no discharge. No scleral icterus.       Acuity is normal  Neck: Normal range of motion. Neck supple. No thyromegaly present.  Cardiovascular: Normal rate and regular rhythm.  Exam reveals no friction rub.   No murmur heard. Pulmonary/Chest: Effort normal and breath sounds normal.  Abdominal: Soft.       Gravid abdomen, with fetal heart tones about 150 on bedside ultrasound exam.  Musculoskeletal: She exhibits no edema and no tenderness.  Neurological: She is alert and oriented to person, place, and time. No cranial nerve deficit. She exhibits normal muscle tone. Coordination normal.  Skin: She is not diaphoretic.  Psychiatric: She has a normal mood and affect. Her speech is normal and behavior is normal. Judgment and thought content normal. Cognition and memory are normal.    ED Course  Procedures (including critical care time)   Labs Reviewed  PROTEIN / CREATININE RATIO, URINE  CBC  COMPREHENSIVE METABOLIC PANEL  MAGNESIUM  URINALYSIS, ROUTINE W REFLEX MICROSCOPIC   No results found.   No diagnosis found.    MDM  This 25yo G3 P2 at about 7 months estimated gestational age now presents with concerns over blurred vision. On exam the patient is in no distress, has no reproducible visual deficits, and has fetal heart tones of 150 with good fetal motion on bedside ultrasound.  The patient's  management was conducted with the assistance of the rapid response OB nurse. The patient's labs do not demonstrate acute findings, though she is noted to be mildly more anemic than her baseline. Absent evidence of hypertension, preeclampsia, or any focal neurologic findings, the patient will be discharged. She was counseled regarding the necessity to follow up as directed with the [facility], and/or [facility].        Gerhard Munch, MD 12/15/10 [phone]

## 2010-12-15 NOTE — ED Notes (Signed)
Pt reports dizziness, cramping and leg pain, Pt also reports dark urine output.

## 2010-12-15 NOTE — ED Notes (Signed)
Waiting on ultrasound

## 2010-12-15 NOTE — ED Notes (Signed)
Pt is currently pregnant due sometime in Dec. Pt has not had prenatal care.

## 2010-12-15 NOTE — ED Notes (Signed)
Rapid ob nurse has been called,  She in enroute

## 2011-01-01 ENCOUNTER — Ambulatory Visit (INDEPENDENT_AMBULATORY_CARE_PROVIDER_SITE_OTHER): Payer: Medicaid Other | Admitting: Advanced Practice Midwife

## 2011-01-01 ENCOUNTER — Other Ambulatory Visit (HOSPITAL_COMMUNITY)
Admission: RE | Admit: 2011-01-01 | Discharge: 2011-01-01 | Disposition: A | Discharge status: Home / Self Care | Payer: Medicaid Other | Source: Ambulatory Visit | Attending: Advanced Practice Midwife | Admitting: Advanced Practice Midwife

## 2011-01-01 VITALS — BP 107/81 | Temp 97.9°F | Ht 69.0 in | Wt 159.2 lb

## 2011-01-01 DIAGNOSIS — Z01419 Encounter for gynecological examination (general) (routine) without abnormal findings: Secondary | ICD-10-CM | POA: Insufficient documentation

## 2011-01-01 DIAGNOSIS — O0930 Supervision of pregnancy with insufficient antenatal care, unspecified trimester: Secondary | ICD-10-CM

## 2011-01-01 DIAGNOSIS — Z3490 Encounter for supervision of normal pregnancy, unspecified, unspecified trimester: Secondary | ICD-10-CM

## 2011-01-01 DIAGNOSIS — O34219 Maternal care for unspecified type scar from previous cesarean delivery: Secondary | ICD-10-CM

## 2011-01-01 LAB — CBC
Hemoglobin: 12.5 g/dL (ref 12.0–15.0)
MCHC: 32.3 g/dL (ref 30.0–36.0)
RBC: 4.19 MIL/uL (ref 3.87–5.11)
WBC: 12 10*3/uL — ABNORMAL HIGH (ref 4.0–10.5)

## 2011-01-01 LAB — POCT URINALYSIS DIP (DEVICE)
Glucose, UA: NEGATIVE mg/dL
Specific Gravity, Urine: >=1.03 (ref 1.005–1.030)

## 2011-01-01 LAB — RPR

## 2011-01-01 NOTE — Progress Notes (Signed)
Subjective:    Monique Lyons is a 18 [redacted]w[redacted]d being seen today for her first obstetrical visit.  Her obstetrical history is significant for previous cesarean (1st) and VBAC (2nd). Pregnancy history fully reviewed.  Patient reports spots in vision and lightheadedness. Patient also has inguinal pain which she also experienced with prior pregnancies.   Filed Vitals:   01/01/11 0837 01/01/11 0844  BP: 107/81   Temp: 97.9 F (36.6 C)   Height:  5\' 9"  (1.753 m)  Weight: 159 lb 3.2 oz (72.213 kg)     HISTORY: OB History    Grav Para Term Preterm Abortions TAB SAB Ect Mult Living   4 2 2  1     2      # Outc Date GA Lbr Len/2nd Wgt Sex Del Anes PTL Lv   1 TRM 11/05 [redacted]w[redacted]d  6lb13oz(3.09kg) M CS EPI  Yes   2 TRM 3/10 [redacted]w[redacted]d   F SVD EPI  Yes   3 ABT            4 CUR              Past Medical History  Diagnosis Date   Ovarian cyst    No pertinent past medical history    Past Surgical History  Procedure Date   Cesarean section     first was c-section; second vbac   Family History  Problem Relation Age of Onset   Endometriosis Mother      Exam    Uterine Size: 32 cm  Pelvic Exam:    Perineum: No Hemorrhoids   Vulva: normal   Vagina:  normal mucosa   pH:    Cervix: no bleeding following Pap and no lesions   Adnexa: normal adnexa   Bony Pelvis: average  System: Breast:  normal appearance, no masses or tenderness   Skin: normal coloration and turgor, no rashes    Neurologic: oriented, normal, normal mood, oriented   Extremities: normal strength, tone, and muscle mass   HEENT PERRLA   Mouth/Teeth mucous membranes moist, pharynx normal without lesions   Neck supple   Cardiovascular: regular rate and rhythm, no murmurs or gallops   Respiratory:  appears well, vitals normal, no respiratory distress, acyanotic, normal RR, ear and throat exam is normal, neck free of mass or lymphadenopathy, chest clear, no wheezing, crepitations, rhonchi, normal symmetric air entry     Abdomen: normal findings: symmetric   Urinary: urethral meatus normal   Results for orders placed in visit on 01/01/11 (from the past 24 hour(s))  POCT URINALYSIS DIP (DEVICE)     Status: Abnormal   Collection Time   01/01/11  8:30 AM      Component Value Range   Glucose, UA NEGATIVE  NEGATIVE (mg/dL)   Bilirubin Urine SMALL (*) NEGATIVE    Ketones, ur TRACE (*) NEGATIVE (mg/dL)   Specific Gravity, Urine >=1.030  1.005 - 1.030    Hgb urine dipstick NEGATIVE  NEGATIVE    pH 5.5  5.0 - 8.0    Protein, ur 30 (*) NEGATIVE (mg/dL)   Urobilinogen, UA 1.0  0.0 - 1.0 (mg/dL)   Nitrite NEGATIVE  NEGATIVE    Leukocytes, UA NEGATIVE  NEGATIVE       Assessment:    Pregnancy: [id number] There is no problem list on file for this patient.       Plan:    Discussed with patient plan for prenatal care. Patient was agreeable. Encouraged patient to drink more water  and continue taking prenatal vitamins. Detailed ultrasound to be scheduled in one week. Patient given information on VBAC vs. Cesarean deliveries. Patient expressed she currently wants VBAC. Return to clinic for follow upc are.   Initial labs drawn. Prenatal vitamins. Problem list reviewed and updated.  Ultrasound discussed; fetal survey: ordered.  Follow up in 2 weeks.   Mosetta Putt, PA-S 01/01/2011

## 2011-01-01 NOTE — Progress Notes (Signed)
Ob US scheduled for 01/07/11 @ 0800

## 2011-01-01 NOTE — Patient Instructions (Signed)
Pregnancy - Third Trimester The third trimester of pregnancy (the last 3 months) is a period of the most rapid growth for you and your baby. The baby approaches a length of 20 inches and a weight of 6 to 10 pounds. The baby is adding on fat and getting ready for life outside your body. While inside, babies have periods of sleeping and waking, suck their thumbs, and hiccups. You can often feel small contractions of the uterus. This is false labor. It is also called Braxton-Hicks contractions. This is like a practice for labor. The usual problems in this stage of pregnancy include more difficulty breathing, swelling of the hands and feet from water retention, and having to urinate more often because of the uterus and baby pressing on your bladder.  PRENATAL EXAMS  Blood work may continue to be done during prenatal exams. These tests are done to check on your health and the probable health of your baby. Blood work is used to follow your blood levels (hemoglobin). Anemia (low hemoglobin) is common during pregnancy. Iron and vitamins are given to help prevent this. You may also continue to be checked for diabetes. Some of the past blood tests may be done again.   The size of the uterus is measured during each visit. This makes sure your baby is growing properly according to your pregnancy dates.   Your blood pressure is checked every prenatal visit. This is to make sure you are not getting toxemia.   Your urine is checked every prenatal visit for infection, diabetes and protein.   Your weight is checked at each visit. This is done to make sure gains are happening at the suggested rate and that you and your baby are growing normally.   Sometimes, an ultrasound is performed to confirm the position and the proper growth and development of the baby. This is a test done that bounces harmless sound waves off the baby so your caregiver can more accurately determine due dates.   Discuss the type of pain  medication and anesthesia you will have during your labor and delivery.   Discuss the possibility and anesthesia if a Cesarean Section might be necessary.   Inform your caregiver if there is any mental or physical violence at home.  Sometimes, a specialized non-stress test, contraction stress test and biophysical profile are done to make sure the baby is not having a problem. Checking the amniotic fluid surrounding the baby is called an amniocentesis. The amniotic fluid is removed by sticking a needle into the belly (abdomen). This is sometimes done near the end of pregnancy if an early delivery is required. In this case, it is done to help make sure the baby's lungs are mature enough for the baby to live outside of the womb. If the lungs are not mature and it is unsafe to deliver the baby, an injection of cortisone medication is given to the mother 1 to 2 days before the delivery. This helps the baby's lungs mature and makes it safer to deliver the baby. CHANGES OCCURING IN THE THIRD TRIMESTER OF PREGNANCY Your body goes through many changes during pregnancy. They vary from person to person. Talk to your caregiver about changes you notice and are concerned about.  During the last trimester, you have probably had an increase in your appetite. It is normal to have cravings for certain foods. This varies from person to person and pregnancy to pregnancy.   You may begin to get stretch marks on your hips,   abdomen, and breasts. These are normal changes in the body during pregnancy. There are no exercises or medications to take which prevent this change.   Constipation may be treated with a stool softener or adding bulk to your diet. Drinking lots of fluids, fiber in vegetables, fruits, and whole grains are helpful.   Exercising is also helpful. If you have been very active up until your pregnancy, most of these activities can be continued during your pregnancy. If you have been less active, it is helpful  to start an exercise program such as walking. Consult your caregiver before starting exercise programs.   Avoid all smoking, alcohol, un-prescribed drugs, herbs and "street drugs" during your pregnancy. These chemicals affect the formation and growth of the baby. Avoid chemicals throughout the pregnancy to ensure the delivery of a healthy infant.   Backache, varicose veins and hemorrhoids may develop or get worse.   You will tire more easily in the third trimester, which is normal.   The baby's movements may be stronger and more often.   You may become short of breath easily.   Your belly button may stick out.   A yellow discharge may leak from your breasts called colostrum.   You may have a bloody mucus discharge. This usually occurs a few days to a week before labor begins.  HOME CARE INSTRUCTIONS   Keep your caregiver's appointments. Follow your caregiver's instructions regarding medication use, exercise, and diet.   During pregnancy, you are providing food for you and your baby. Continue to eat regular, well-balanced meals. Choose foods such as meat, fish, milk and other low fat dairy products, vegetables, fruits, and whole-grain breads and cereals. Your caregiver will tell you of the ideal weight gain.   A physical sexual relationship may be continued throughout pregnancy if there are no other problems such as early (premature) leaking of amniotic fluid from the membranes, vaginal bleeding, or belly (abdominal) pain.   Exercise regularly if there are no restrictions. Check with your caregiver if you are unsure of the safety of your exercises. Greater weight gain will occur in the last 2 trimesters of pregnancy. Exercising helps:   Control your weight.   Get you in shape for labor and delivery.   You lose weight after you deliver.   Rest a lot with legs elevated, or as needed for leg cramps or low back pain.   Wear a good support or jogging bra for breast tenderness during  pregnancy. This may help if worn during sleep. Pads or tissues may be used in the bra if you are leaking colostrum.   Do not use hot tubs, steam rooms, or saunas.   Wear your seat belt when driving. This protects you and your baby if you are in an accident.   Avoid raw meat, cat litter boxes and soil used by cats. These carry germs that can cause birth defects in the baby.   It is easier to loose urine during pregnancy. Tightening up and strengthening the pelvic muscles will help with this problem. You can practice stopping your urination while you are going to the bathroom. These are the same muscles you need to strengthen. It is also the muscles you would use if you were trying to stop from passing gas. You can practice tightening these muscles up 10 times a set and repeating this about 3 times per day. Once you know what muscles to tighten up, do not perform these exercises during urination. It is more likely   to cause an infection by backing up the urine.   Ask for help if you have financial, counseling or nutritional needs during pregnancy. Your caregiver will be able to offer counseling for these needs as well as refer you for other special needs.   Make a list of emergency phone numbers and have them available.   Plan on getting help from family or friends when you go home from the hospital.   Make a trial run to the hospital.   Take prenatal classes with the father to understand, practice and ask questions about the labor and delivery.   Prepare the baby's room/nursery.   Do not travel out of the city unless it is absolutely necessary and with the advice of your caregiver.   Wear only low or no heal shoes to have better balance and prevent falling.  MEDICATIONS AND DRUG USE IN PREGNANCY  Take prenatal vitamins as directed. The vitamin should contain 1 milligram of folic acid. Keep all vitamins out of reach of children. Only a couple vitamins or tablets containing iron may be fatal  to a baby or young child when ingested.   Avoid use of all medications, including herbs, over-the-counter medications, not prescribed or suggested by your caregiver. Only take over-the-counter or prescription medicines for pain, discomfort, or fever as directed by your caregiver. Do not use aspirin, ibuprofen (Motrin, Advil, Nuprin) or naproxen (Aleve) unless OK'd by your caregiver.   Let your caregiver also know about herbs you may be using.   Alcohol is related to a number of birth defects. This includes fetal alcohol syndrome. All alcohol, in any form, should be avoided completely. Smoking will cause low birth rate and premature babies.   Street/illegal drugs are very harmful to the baby. They are absolutely forbidden. A baby born to an addicted mother will be addicted at birth. The baby will go through the same withdrawal an adult does.  SEEK MEDICAL CARE IF: You have any concerns or worries during your pregnancy. It is better to call with your questions if you feel they cannot wait, rather than worry about them. DECISIONS ABOUT CIRCUMCISION You may or may not know the sex of your baby. If you know your baby is a boy, it may be time to think about circumcision. Circumcision is the removal of the foreskin of the penis. This is the skin that covers the sensitive end of the penis. There is no proven medical need for this. Often this decision is made on what is popular at the time or based upon religious beliefs and social issues. You can discuss these issues with your caregiver or pediatrician. SEEK IMMEDIATE MEDICAL CARE IF:   An unexplained oral temperature above 102 F (38.9 C) develops, or as your caregiver suggests.   You have leaking of fluid from the vagina (birth canal). If leaking membranes are suspected, take your temperature and tell your caregiver of this when you call.   There is vaginal spotting, bleeding or passing clots. Tell your caregiver of the amount and how many pads are  used.   You develop a bad smelling vaginal discharge with a change in the color from clear to white.   You develop vomiting that lasts more than 24 hours.   You develop chills or fever.   You develop shortness of breath.   You develop burning on urination.   You loose more than 2 pounds of weight or gain more than 2 pounds of weight or as suggested by your   caregiver.   You notice sudden swelling of your face, hands, and feet or legs.   You develop belly (abdominal) pain. Round ligament discomfort is a common non-cancerous (benign) cause of abdominal pain in pregnancy. Your caregiver still must evaluate you.   You develop a severe headache that does not go away.   You develop visual problems, blurred or double vision.   If you have not felt your baby move for more than 1 hour. If you think the baby is not moving as much as usual, eat something with sugar in it and lie down on your left side for an hour. The baby should move at least 4 to 5 times per hour. Call right away if your baby moves less than that.   You fall, are in a car accident or any kind of trauma.   There is mental or physical violence at home.  Document Released: 01/14/2001 Document Revised: 10/02/2010 Document Reviewed: 07/19/2008 [employer] Patient Information 2012 [employer]. Birth Control Choices Birth control is the use of any practices, methods, or devices to prevent pregnancy from happening in a sexually active woman.  Below are some birth control choices to help avoid pregnancy.  Not having sex (abstinence) is the surest form of birth control. This requires self-control. There is no risk of acquiring a sexually transmitted disease (STD), including acquired immunodeficiency syndrome (AIDS).   Periodic abstinence requires self-control during certain times of the month.   Calendar method, timing your menstrual periods from month to month.   Ovulation method is avoiding sexual intercourse around the time  you produce an egg (ovulate).   Symptotherm method is avoiding sexual intercourse at the time of ovulation, using a thermometer and ovulation symptoms.   Post ovulation method is the timing of sexual intercourse after you ovulated.  These methods do not protect against STDs, including AIDS.  Birth control pills (BCPs) contain estrogen and progesterone hormone. These medicines work by stopping the egg from forming in the ovary (ovulation). Birth control pills are prescribed by a caregiver who will ask you questions about the risks of taking BCPs. Birth control pills do not protect against STDs, including AIDS.   "Minipill" birth control pills have only the progesterone hormone. They are taken every day of each month and must be prescribed by your caregiver. They do not protect against STDs, including AIDS.   Emergency contraception is often call the "morning after" pill. This pill can be taken right after sex or up to five days after sex if you think your birth control failed, you failed to use contraception, or you were forced to have sex. It is most effective the sooner you take the pills after having sexual intercourse. Do not use emergency contraception as your only form of birth control. Emergency contraceptive pills are available without a prescription. Check with your pharmacist.   Condoms are a thin sheath of latex, synthetic material, or lambskin worn over the penis during sexual intercourse. They can have a spermicide in or on them when you buy them. Latex condoms can prevent pregnancy and STDs. "Natural" or lambskin condoms can prevent pregnancy but may not protect against STDs, including AIDS.   Female condoms are a soft, loose-fitting sheath that is put into the vagina before sexual intercourse. They can prevent pregnancy and STDs, including AIDS.   Sponge is a soft, circular piece of polyurethane foam with spermicide in it that is inserted into the vagina after wetting it and before  sexual intercourse. It does   not require a prescription from your caregiver. It does not protect against STDs, including AIDS.   Diaphragm is a soft, latex, dome-shaped barrier that must be fitted by a caregiver. It is inserted into the vagina, along with a spermicidal jelly. After the proper fitting for a diaphragm, always insert the diaphragm before intercourse. The diaphragm should be left in the vagina for 6 to 8 hours after intercourse. Removal and reinsertion with a spermicide is always necessary after any use. It does not protect against STDs, including AIDS.   Progesterone-only injections are given every 3 months to prevent pregnancy. These injections contain synthetic progesterone and no estrogen. This hormone stops the ovaries from releasing eggs. It also causes the cervical mucus to thicken and changes the uterine lining. This makes it harder for sperm to survive in the uterus. It does not protect against STDs, including AIDS.   Birth Control Patch contains hormones similar to those in birth control pills, so effectiveness, risks, and side effects are similar. It must be changed once a week and is prescribed by a caregiver. It is less effective in very overweight women. It does not protect against STDs, including AIDS.   Vaginal Ring contains hormones similar to those in birth control pills. It is left in place for 3 weeks, removed for 1 week, and then a new one is put back into the vagina. It comes with a timer to put in your purse to help you remember when to take it out or put a new one in. A caregiver's examination and prescription is necessary, just like with birth control pills and the patch. It does not protect against STDs, including AIDS.   Estrogen plus progesterone injections are given every 28 to 30 days. They can be given in the upper arm, thigh, or buttocks. It does not protect against STDs, including AIDS.   Intrauterine device (IUD): copper T or progestin filled is a T-shaped  device that is put in a woman's uterus during a menstrual period to prevent pregnancy. The copper T IUD can last 10 years, and the progestin IUD can last 5 years. The progestin IUD can also help control heavy menstrual periods. It does not protect against STDs, including AIDS. The copper T IUD can be used as emergency contraception if inserted within 5 days of having unprotected intercourse.   Cervical cap is a round, soft latex or plastic cup that fits over the cervix and must be fitted by a caregiver. You do not need to use a spermicide with it or remove and insert it every time you have sexual intercourse. It does not protect against STDs, including AIDS.   Spermicides are chemicals that kill or block sperm from entering the cervix and uterus. They come in the form of creams, jellies, suppositories, foam, or tablets, and they do not require a prescription. They are inserted into the vagina with an applicator before having sexual intercourse. This must be repeated every time you have sexual intercourse.   Withdrawal is using the method of the female withdrawing his penis from sexual intercourse before he has a climax and deposits his sperm. It does not protect against STDs, including AIDS.   Female tubal ligation is when the woman's fallopian tubes are surgically sealed or tied to prevent the egg from traveling to the uterus. It does not protect against STDs, including AIDS.   Female sterilization is when the female has his tubes that carry sperm tied off (vasectomy) to stop sperm from entering the   vagina during sexual intercourse. It does not protect against STDs, including AIDS.  Regardless of which method of birth control you choose, it is still important that you use some form of protection against STDs. Document Released: 01/20/2005 Document Revised: 02/22/2010 Document Reviewed: 12/07/2008 [employer] Patient Information 2012 [employer]. Breastfeeding BENEFITS OF BREASTFEEDING For the baby  The  first milk (colostrum) helps the baby's digestive system function better.   There are antibodies from the mother in the milk that help the baby fight off infections.   The baby has a lower incidence of asthma, allergies, and SIDS (sudden infant death syndrome).   The nutrients in breast milk are better than formulas for the baby and helps the baby's brain grow better.   Babies who breastfeed have less gas, colic, and constipation.  For the mother  Breastfeeding helps develop a very special bond between mother and baby.   It is more convenient, always available at the correct temperature and cheaper than formula feeding.   It burns calories in the mother and helps with losing weight that was gained during pregnancy.   It makes the uterus contract back down to normal size faster and slows bleeding following delivery.   Breastfeeding mothers have a lower risk of developing breast cancer.  NURSE FREQUENTLY  A healthy, full-term baby may breastfeed as often as every hour or space his or her feedings to every 3 hours.   How often to nurse will vary from baby to baby. Watch your baby for signs of hunger, not the clock.   Nurse as often as the baby requests, or when you feel the need to reduce the fullness of your breasts.   Awaken the baby if it has been 3 to 4 hours since the last feeding.   Frequent feeding will help the mother make more milk and will prevent problems like sore nipples and engorgement of the breasts.  BABY'S POSITION AT THE BREAST  Whether lying down or sitting, be sure that the baby's tummy is facing your tummy.   Support the breast with 4 fingers underneath the breast and the thumb above. Make sure your fingers are well away from the nipple and baby's mouth.   Stroke the baby's lips and cheek closest to the breast gently with your finger or nipple.   When the baby's mouth is open wide enough, place all of your nipple and as much of the dark area around the nipple  as possible into your baby's mouth.   Pull the baby in close so the tip of the nose and the baby's cheeks touch the breast during the feeding.  FEEDINGS  The length of each feeding varies from baby to baby and from feeding to feeding.   The baby must suck about 2 to 3 minutes for your milk to get to him or her. This is called a "let down." For this reason, allow the baby to feed on each breast as long as he or she wants. Your baby will end the feeding when he or she has received the right balance of nutrients.   To break the suction, put your finger into the corner of the baby's mouth and slide it between his or her gums before removing your breast from his or her mouth. This will help prevent sore nipples.  REDUCING BREAST ENGORGEMENT  In the first week after your baby is born, you may experience signs of breast engorgement. When breasts are engorged, they feel heavy, warm, full, and may   be tender to the touch. You can reduce engorgement if you:   Nurse frequently, every 2 to 3 hours. Mothers who breastfeed early and often have fewer problems with engorgement.   Place light ice packs on your breasts between feedings. This reduces swelling. Wrap the ice packs in a lightweight towel to protect your skin.   Apply moist hot packs to your breast for 5 to 10 minutes before each feeding. This increases circulation and helps the milk flow.   Gently massage your breast before and during the feeding.   Make sure that the baby empties at least one breast at every feeding before switching sides.   Use a breast pump to empty the breasts if your baby is sleepy or not nursing well. You may also want to pump if you are returning to work or or you feel you are getting engorged.   Avoid bottle feeds, pacifiers or supplemental feedings of water or juice in place of breastfeeding.   Be sure the baby is latched on and positioned properly while breastfeeding.   Prevent fatigue, stress, and anemia.   Wear  a supportive bra, avoiding underwire styles.   Eat a balanced diet with enough fluids.  If you follow these suggestions, your engorgement should improve in 24 to 48 hours. If you are still experiencing difficulty, call your lactation consultant or caregiver. IS MY BABY GETTING ENOUGH MILK? Sometimes, mothers worry about whether their babies are getting enough milk. You can be assured that your baby is getting enough milk if:  The baby is actively sucking and you hear swallowing.   The baby nurses at least 8 to 12 times in a 24 hour time period. Nurse your baby until he or she unlatches or falls asleep at the first breast (at least 10 to 20 minutes), then offer the second side.   The baby is wetting 5 to 6 disposable diapers (6 to 8 cloth diapers) in a 24 hour period by 5 to 6 days of age.   The baby is having at least 2 to 3 stools every 24 hours for the first few months. Breast milk is all the food your baby needs. It is not necessary for your baby to have water or formula. In fact, to help your breasts make more milk, it is best not to give your baby supplemental feedings during the early weeks.   The stool should be soft and yellow.   The baby should gain 4 to 7 ounces per week after he is 4 days old.  TAKE CARE OF YOURSELF Take care of your breasts by:  Bathing or showering daily.   Avoiding the use of soaps on your nipples.   Start feedings on your left breast at one feeding and on your right breast at the next feeding.   You will notice an increase in your milk supply 2 to 5 days after delivery. You may feel some discomfort from engorgement, which makes your breasts very firm and often tender. Engorgement "peaks" out within 24 to 48 hours. In the meantime, apply warm moist towels to your breasts for 5 to 10 minutes before feeding. Gentle massage and expression of some milk before feeding will soften your breasts, making it easier for your baby to latch on. Wear a well fitting nursing  bra and air dry your nipples for 10 to 15 minutes after each feeding.   Only use cotton bra pads.   Only use pure lanolin on your nipples after nursing. You   do not need to wash it off before nursing.  Take care of yourself by:   Eating well-balanced meals and nutritious snacks.   Drinking milk, fruit juice, and water to satisfy your thirst (about 8 glasses a day).   Getting plenty of rest.   Increasing calcium in your diet (1200 mg a day).   Avoiding foods that you notice affect the baby in a bad way.  SEEK MEDICAL CARE IF:   You have any questions or difficulty with breastfeeding.   You need help.   You have a hard, red, sore area on your breast, accompanied by a fever of 100.5 F (38.1 C) or more.   Your baby is too sleepy to eat well or is having trouble sleeping.   Your baby is wetting less than 6 diapers per day, by 5 days of age.   Your baby's skin or white part of his or her eyes is more yellow than it was in the hospital.   You feel depressed.  Document Released: 01/20/2005 Document Revised: 10/02/2010 Document Reviewed: 09/04/2008 ExitCare Patient Information 2012 [employer]. 

## 2011-01-01 NOTE — Progress Notes (Signed)
Edema-feet,ankles,hands.  Pain-abdominal cramps, when you urinate burn.  Vaginal area feels "stabbing pain".  Pt c/o blurred vision.  Pulse-103.  Vaginal discharge- dark yellow and mucousy Pt was given education booklet and smoking cessation materials. Info also given on flu and Tdap vaccine, pt will let us know if she wants next visit.

## 2011-01-02 LAB — GLUCOSE TOLERANCE, 1 HOUR: Glucose, 1 Hour GTT: 85 mg/dL (ref 70–140)

## 2011-01-02 LAB — DRUG SCREEN, URINE
Amphetamine Screen, Ur: NEGATIVE
Barbiturate Quant, Ur: NEGATIVE
Benzodiazepines.: NEGATIVE
Cocaine Metabolites: NEGATIVE
Creatinine,U: 304.1 mg/dL
Marijuana Metabolite: NEGATIVE
Methadone: NEGATIVE
Opiates: NEGATIVE
Phencyclidine (PCP): NEGATIVE
Propoxyphene: NEGATIVE

## 2011-01-02 LAB — HEPATITIS B SURFACE ANTIGEN: Hepatitis B Surface Ag: NEGATIVE

## 2011-01-02 LAB — HIV ANTIBODY (ROUTINE TESTING W REFLEX): HIV: NONREACTIVE

## 2011-01-02 LAB — ANTIBODY SCREEN: Antibody Screen: NEGATIVE

## 2011-01-03 LAB — CULTURE, OB URINE: Organism ID, Bacteria: NO GROWTH

## 2011-01-04 ENCOUNTER — Encounter: Payer: Self-pay | Admitting: Advanced Practice Midwife

## 2011-01-04 DIAGNOSIS — Z98891 History of uterine scar from previous surgery: Secondary | ICD-10-CM | POA: Insufficient documentation

## 2011-01-04 DIAGNOSIS — Z23 Encounter for immunization: Secondary | ICD-10-CM | POA: Insufficient documentation

## 2011-01-04 DIAGNOSIS — Z3480 Encounter for supervision of other normal pregnancy, unspecified trimester: Secondary | ICD-10-CM | POA: Insufficient documentation

## 2011-01-04 DIAGNOSIS — O0930 Supervision of pregnancy with insufficient antenatal care, unspecified trimester: Secondary | ICD-10-CM | POA: Insufficient documentation

## 2011-01-04 LAB — RUBELLA ANTIBODY, IGM: Rubella IgM: 0.65 Ratio (ref <0.90)

## 2011-01-04 NOTE — Progress Notes (Signed)
Patient seen by me. Agree with assessment and note. Will get US done. Patient counseled re:  TOL/VBAC.  Consent given for her to review. Wants TOL.

## 2011-01-07 ENCOUNTER — Ambulatory Visit (HOSPITAL_COMMUNITY)
Admission: RE | Admit: 2011-01-07 | Discharge: 2011-01-07 | Disposition: A | Discharge status: Home / Self Care | Payer: Medicaid Other | Source: Ambulatory Visit | Attending: Advanced Practice Midwife | Admitting: Advanced Practice Midwife

## 2011-01-07 DIAGNOSIS — O34219 Maternal care for unspecified type scar from previous cesarean delivery: Secondary | ICD-10-CM | POA: Insufficient documentation

## 2011-01-07 DIAGNOSIS — O0930 Supervision of pregnancy with insufficient antenatal care, unspecified trimester: Secondary | ICD-10-CM | POA: Insufficient documentation

## 2011-01-07 DIAGNOSIS — Z3490 Encounter for supervision of normal pregnancy, unspecified, unspecified trimester: Secondary | ICD-10-CM

## 2011-01-15 ENCOUNTER — Other Ambulatory Visit: Payer: Self-pay | Admitting: Advanced Practice Midwife

## 2011-01-15 ENCOUNTER — Ambulatory Visit (INDEPENDENT_AMBULATORY_CARE_PROVIDER_SITE_OTHER): Payer: Medicaid Other | Admitting: Advanced Practice Midwife

## 2011-01-15 VITALS — BP 111/70 | HR 86 | Temp 96.9°F | Wt 159.1 lb

## 2011-01-15 DIAGNOSIS — Z23 Encounter for immunization: Secondary | ICD-10-CM

## 2011-01-15 DIAGNOSIS — O0930 Supervision of pregnancy with insufficient antenatal care, unspecified trimester: Secondary | ICD-10-CM

## 2011-01-15 DIAGNOSIS — Z3480 Encounter for supervision of other normal pregnancy, unspecified trimester: Secondary | ICD-10-CM

## 2011-01-15 DIAGNOSIS — O34219 Maternal care for unspecified type scar from previous cesarean delivery: Secondary | ICD-10-CM

## 2011-01-15 LAB — POCT URINALYSIS DIP (DEVICE)
Hgb urine dipstick: NEGATIVE
Protein, ur: NEGATIVE mg/dL
Specific Gravity, Urine: 1.025 (ref 1.005–1.030)
Urobilinogen, UA: 0.2 mg/dL (ref 0.0–1.0)

## 2011-01-15 MED ORDER — INFLUENZA VIRUS VACC SPLIT PF IM SUSP
0.5000 mL | INTRAMUSCULAR | Status: AC
  Administered 2011-01-15: 0.5 mL via INTRAMUSCULAR

## 2011-01-15 MED ORDER — INFLUENZA VIRUS VACC SPLIT PF IM SUSP: 0.5000 mL | Freq: Once | INTRAMUSCULAR | Status: DC

## 2011-01-15 NOTE — Progress Notes (Signed)
Patient doing well today. No complaints other than sharp pain in her right lower quadrant over her old C-section scar. No contractions, bleeding or fluid leakage. She does have white discharge associated with burning. Good fetal movement.  Will get flu vaccine today. Will bottle feel baby Wants tubal ligation after delivery for contraception. Discussed signs of labor.  I agree with the above Pain at incision mild and intermittent. Not concerning for uterine rupture. Report worsening of Sx.   Monique Lyons 01/17/2011 3:57 AM

## 2011-01-15 NOTE — Progress Notes (Signed)
Edema: ankles  Pain: RLQ of abdomen Pressure: none C/o of increase urine frequency and dysuria

## 2011-01-15 NOTE — Patient Instructions (Signed)
Normal Labor and Delivery °Your caregiver must first be sure you are in labor. Signs of labor include: °· You may pass what is called "the mucus plug" before labor begins. This is a small amount of blood stained mucus.  °· Regular uterine contractions.  °· The time between contractions get closer together.  °· The discomfort and pain gradually gets more intense.  °· Pains are mostly located in the back.  °· Pains get worse when walking.  °· The cervix (the opening of the uterus becomes thinner (begins to efface) and opens up (dilates).  °Once you are in labor and admitted into the hospital or care center, your caregiver will do the following: °· A complete physical examination.  °· Check your vital signs (blood pressure, pulse, temperature and the fetal heart rate).  °· Do a vaginal examination (using a sterile glove and lubricant) to determine:  °· The position (presentation) of the baby (head [vertex] or buttock first).  °· The level (station) of the baby's head in the birth canal.  °· The effacement and dilatation of the cervix.  °· You may have your pubic hair shaved and be given an enema depending on your caregiver and the circumstance.  °· An electronic monitor is usually placed on your abdomen. The monitor follows the length and intensity of the contractions, as well as the baby's heart rate.  °· Usually, your caregiver will insert an IV in your arm with a bottle of sugar water. This is done as a precaution so that medications can be given to you quickly during labor or delivery.  °NORMAL LABOR AND DELIVERY IS DIVIDED UP INTO 3 STAGES: °First Stage °This is when regular contractions begin and the cervix begins to efface and dilate. This stage can last from 3 to 15 hours. The end of the first stage is when the cervix is 100% effaced and 10 centimeters dilated. Pain medications may be given by  °· Injection (morphine, demerol, etc.)  °· Regional anesthesia (spinal, caudal or epidural, anesthetics given in  different locations of the spine). Paracervical pain medication may be given, which is an injection of and anesthetic on each side of the cervix.  °A pregnant woman may request to have "Natural Childbirth" which is not to have any medications or anesthesia during her labor and delivery. °Second Stage °This is when the baby comes down through the birth canal (vagina) and is born. This can take 1 to 4 hours. As the baby's head comes down through the birth canal, you may feel like you are going to have a bowel movement. You will get the urge to bear down and push until the baby is delivered. As the baby's head is being delivered, the caregiver will decide if an episiotomy (a cut in the perineum and vagina area) is needed to prevent tearing of the tissue in this area. The episiotomy is sewn up after the delivery of the baby and placenta. Sometimes a mask with nitrous oxide is given for the mother to breath during the delivery of the baby to help if there is too much pain. The end of Stage 2 is when the baby is fully delivered. Then when the umbilical cord stops pulsating it is clamped and cut. °Third Stage °The third stage begins after the baby is completely delivered and ends after the placenta (afterbirth) is delivered. This usually takes 5 to 30 minutes. After the placenta is delivered, a medication is given either by intravenous or injection to help contract   the uterus and prevent bleeding. The third stage is not painful and pain medication is usually not necessary. If an episiotomy was done, it is repaired at this time. °After the delivery, the mother is watched and monitored closely for 1 to 2 hours to make sure there is no postpartum bleeding (hemorrhage). If there is a lot of bleeding, medication is given to contract the uterus and stop the bleeding. °Document Released: 10/30/2007 Document Revised: 10/02/2010 Document Reviewed: 10/30/2007 °ExitCare® Patient Information ©2012 [employer]. °

## 2011-02-04 NOTE — L&D Delivery Note (Cosign Needed)
Delivery Note At 10:33 PM a viable female was delivered via Vaginal, Spontaneous Delivery (Presentation LOA  ).  Successful VBAC.  APGAR:8/9 weight 7lb 1.2oz (3210gm) Placenta status: delivered intact via schultz  Cord:  3vc with the following complications:none.  Large gush of light-meconium stained fluid immediately after baby.    Anesthesia: Epidural  Episiotomy: None Lacerations: hemostatic1st degree bilateral periurethral- no repair  Suture Repair: n/a Est. Blood Loss (mL):  Mom to postpartum.  Baby to nursery-stable.  Plans to bottlefeed.  Doesn't want circumcision.  Wanted BTL, states she signed papers in clinic, but was told they were unable to find papers.    [name], CNM in attendance of birth.  Joellyn Haff, SNM 02/23/2011, 10:45 PM

## 2011-02-05 ENCOUNTER — Ambulatory Visit (INDEPENDENT_AMBULATORY_CARE_PROVIDER_SITE_OTHER): Payer: Medicaid Other | Admitting: Family

## 2011-02-05 ENCOUNTER — Other Ambulatory Visit: Payer: Self-pay | Admitting: Family

## 2011-02-05 DIAGNOSIS — Z3480 Encounter for supervision of other normal pregnancy, unspecified trimester: Secondary | ICD-10-CM

## 2011-02-05 DIAGNOSIS — O0930 Supervision of pregnancy with insufficient antenatal care, unspecified trimester: Secondary | ICD-10-CM

## 2011-02-05 DIAGNOSIS — Z23 Encounter for immunization: Secondary | ICD-10-CM

## 2011-02-05 LAB — POCT URINALYSIS DIP (DEVICE)
Glucose, UA: NEGATIVE mg/dL
Hgb urine dipstick: NEGATIVE
Nitrite: NEGATIVE
Urobilinogen, UA: 0.2 mg/dL (ref 0.0–1.0)
pH: 6 (ref 5.0–8.0)

## 2011-02-05 MED ORDER — TETANUS-DIPHTH-ACELL PERTUSSIS 5-2.5-18.5 LF-MCG/0.5 IM SUSP
0.5000 mL | Freq: Once | INTRAMUSCULAR | Status: AC
  Administered 2011-02-05: 0.5 mL via INTRAMUSCULAR

## 2011-02-05 NOTE — Progress Notes (Signed)
Addended by: Sherre Lain A on: 02/05/2011 11:54 AM   Modules accepted: Orders

## 2011-02-05 NOTE — Progress Notes (Signed)
No questions or concerns; GBS/GC/CT completed; reports signing VBAC and Tubal forms already (nurse searching for forms in EPIC); tdap today

## 2011-02-05 NOTE — Progress Notes (Signed)
P=87 , taking prevacid from family member prescription,c/o trace edema in ankles, c/o pain under left ribs and states couldn't sleep all night -" felt like someone burning me with a torch",desires tdap today

## 2011-02-08 LAB — CULTURE, BETA STREP (GROUP B ONLY)

## 2011-02-18 ENCOUNTER — Observation Stay (HOSPITAL_COMMUNITY)
Admission: AD | Admit: 2011-02-18 | Discharge: 2011-02-18 | Disposition: A | Discharge status: Home / Self Care | Payer: Medicaid Other | Source: Ambulatory Visit | Attending: Obstetrics & Gynecology | Admitting: Obstetrics & Gynecology

## 2011-02-18 ENCOUNTER — Encounter (HOSPITAL_COMMUNITY): Payer: Self-pay | Admitting: *Deleted

## 2011-02-18 DIAGNOSIS — O99891 Other specified diseases and conditions complicating pregnancy: Secondary | ICD-10-CM | POA: Insufficient documentation

## 2011-02-18 DIAGNOSIS — O479 False labor, unspecified: Principal | ICD-10-CM | POA: Insufficient documentation

## 2011-02-18 NOTE — Progress Notes (Signed)
[place], CNM notified of patient here for triage - pt examined 2cm/80%/-2 - reactive FHR tracing with one uc noted - no complaints of SROM - to discharge with labor warnings Shela Commons, RN 02/18/2011

## 2011-02-18 NOTE — Progress Notes (Signed)
FHR tracing reactive and reassuring prior to discharge - second reviewer Shela Commons, RN 02/18/2011

## 2011-02-19 ENCOUNTER — Ambulatory Visit (INDEPENDENT_AMBULATORY_CARE_PROVIDER_SITE_OTHER): Payer: Medicaid Other | Admitting: Family Medicine

## 2011-02-19 ENCOUNTER — Other Ambulatory Visit: Payer: Self-pay

## 2011-02-19 ENCOUNTER — Telehealth (HOSPITAL_COMMUNITY): Payer: Self-pay | Admitting: *Deleted

## 2011-02-19 VITALS — BP 127/79 | Temp 96.7°F | Wt 162.7 lb

## 2011-02-19 DIAGNOSIS — O480 Post-term pregnancy: Secondary | ICD-10-CM

## 2011-02-19 DIAGNOSIS — O0930 Supervision of pregnancy with insufficient antenatal care, unspecified trimester: Secondary | ICD-10-CM

## 2011-02-19 LAB — POCT URINALYSIS DIP (DEVICE)
Bilirubin Urine: NEGATIVE
Glucose, UA: NEGATIVE mg/dL
Hgb urine dipstick: NEGATIVE
Ketones, ur: NEGATIVE mg/dL
Nitrite: NEGATIVE
Specific Gravity, Urine: >=1.03 (ref 1.005–1.030)
pH: 6 (ref 5.0–8.0)

## 2011-02-19 NOTE — Progress Notes (Signed)
Pt desires IOL ASAP- explained that 41 wks is the earliest unless medical or fetal complication exists.  IOL scheduled for 02/23/11 @ 0730

## 2011-02-19 NOTE — Telephone Encounter (Signed)
Preadmission screen  

## 2011-02-19 NOTE — Patient Instructions (Signed)
Labor Induction A pregnant woman usually goes into labor spontaneously before the birth of her baby. Most babies are born between 37 and 42 weeks of the pregnancy. When this does not happen, caregivers may use medication or other methods to bring on (induce) labor. Labor induction causes a pregnant woman's uterus to contract, the cervix to open (dilate) and thin out (efface) to prepare for the vaginal birth of her baby. Several methods of labor induction may be used such as:  Massaging the nipple and areola of the breasts (nipple stimulation).   Prostaglandin medication used orally or as a vaginal cream.   Striping of membranes (your caregiver inserts a finger between the cervix and membranes around the baby's head) causes the body to produce prostaglandins that soften the cervix and cause the uterus to contract.   Rupture of the water bag (amniotomy).   Oxytocin by IV.   Special dilators placed into the cervical canal that causes the cervix to soften and open.   Mechanical devices to stretch open the cervix such as, a dilated foley catheter.  Whether your labor will be induced depends on the condition of you and your baby, how far along you are, are the baby's lung maturity, the condition of the cervix, the way the baby is lying, and other factors. Usually, labor is not induced before 39 weeks of the pregnancy unless there is a problem with the baby or mother, and it becomes necessary to induce labor. REASONS LABOR SHOULD BE INDUCED  The health of the baby or mother has become at risk.   The pregnancy is overdue by 2 weeks or more.   Your water breaks (premature rupture of membranes), the baby's lungs are mature, and labor does not start on its own.   You develop high blood pressure (toxemia of pregnancy).   You develop an infection in your uterus.   You have diabetes or other serious medical illness.   Amniotic fluid amounts are small around the baby.   Your placenta begins to  separate from the inner wall of the uterus before the baby is born (placental abruption). This condition may cause you to have an emergency Cesarean delivery.   You have fetal death.   A social induction is also known as an induction for convenience. Most of the time, labor is induced for sound medical reasons. Sometimes, it is done as a convenience. Living a long way from the hospital or having a history of very rapid labors may be reasons the mother may want to induce delivery.  REASONS LABOR SHOULD NOT BE INDUCED  You have had previous surgeries on your uterus. This is especially true if the surgeries went into the inside lining and cavity of the uterus. This gives an added risk for rupturing the uterus.   You have placenta previa. This means your placenta lies very low in the uterus and blocks the opening (cervix) for the baby to get out.   Your baby is not in a head down position. For example, if your baby lies across your uterus (transverse) instead of head first.   If the umbilical cord drops down into the birth canal in front of your baby. This could cut off the baby's blood supply and oxygen to the baby.  RISKS AND COMPLICATIONS Problems seldom occur with labor induction, but there can be some complications. Some of the risks of induction include:  Change in fetal heart rate (too high, too low or irradic).   Increased risk of   a premature baby, even if you think your baby is term.   Increased risk of fetal distress. This means your baby gets into problems during induction. This can be caused by the umbilical cord coming out in front of the baby or is being squeezed.   Increased risk of infection to mother and baby.   Increased chance of having a Cesarean delivery. This is an operation on your belly (abdomen) to remove the baby.   Strong contractions can lead to abruption. This is a separating of the placenta from the uterus.   Uterine rupture, especially if you had a previous  Cesarean or surgery on your uterus.  When labor is induced because of medical problems, other risks may be present. Induced labor may lead to:  Increased use of medications for pain relief.   Other interventions.  When induction is needed for medical reasons, the benefits of induction may outweigh the risks. PROCEDURE It can sometimes take up to 2 or 3 days to induce labor. It usually takes less time. It takes longer when you are induced early in the pregnancy and for first pregnancies.  Before coming to the hospital for an induction:  Do not eat much before you come to the hospital (for at least 8 hours).   Do not eat after midnight if you are going to be induced the next morning.   Be aware that medications for labor induction can upset your stomach.   Let your caregiver know if you need medications for pain.  HOME CARE INSTRUCTIONS If you have been induced in your caregiver's office to start labor, and are allowed to go home, follow the instructions given to you by your care giver. SEEK IMMEDIATE MEDICAL CARE IF:  You develop any kind of vaginal bleeding.   You develop contractions that are severe and continuous.   You feel faint or feel light headed.   You do not develop contractions within the time your caregiver suggests you should.   You begin to run a temperature of 100 F (37.8 C) or develop chills.   You no longer feel the normal fetal movement.  Document Released: 06/11/2006 Document Revised: 10/02/2010 Document Reviewed: 09/29/2008 Exit[employer] Patient Information 2012 [employer]. 

## 2011-02-19 NOTE — Progress Notes (Signed)
Addended by: Levie Heritage on: 02/19/2011 12:15 PM   Modules accepted: Level of Service

## 2011-02-19 NOTE — Progress Notes (Signed)
No complaints.  Membranes stripped.  Denies vaginal bleeding, abnormal vaginal discharge, contractions, loss of fluid.  Reports good fetal activity.

## 2011-02-20 ENCOUNTER — Telehealth (HOSPITAL_COMMUNITY): Payer: Self-pay | Admitting: *Deleted

## 2011-02-23 ENCOUNTER — Encounter (HOSPITAL_COMMUNITY): Payer: Self-pay

## 2011-02-23 ENCOUNTER — Inpatient Hospital Stay (HOSPITAL_COMMUNITY)
Admission: RE | Admit: 2011-02-23 | Discharge: 2011-02-25 | DRG: 775 | Disposition: A | Discharge status: Home / Self Care | Payer: Medicaid Other | Source: Ambulatory Visit | Attending: Obstetrics & Gynecology | Admitting: Obstetrics & Gynecology

## 2011-02-23 ENCOUNTER — Encounter (HOSPITAL_COMMUNITY): Payer: Self-pay | Admitting: Anesthesiology

## 2011-02-23 ENCOUNTER — Inpatient Hospital Stay (HOSPITAL_COMMUNITY): Payer: Medicaid Other | Admitting: Anesthesiology

## 2011-02-23 DIAGNOSIS — O480 Post-term pregnancy: Secondary | ICD-10-CM

## 2011-02-23 DIAGNOSIS — O34219 Maternal care for unspecified type scar from previous cesarean delivery: Secondary | ICD-10-CM

## 2011-02-23 DIAGNOSIS — Z3480 Encounter for supervision of other normal pregnancy, unspecified trimester: Secondary | ICD-10-CM

## 2011-02-23 LAB — CBC
HCT: 36.6 % (ref 36.0–46.0)
Hemoglobin: 12.1 g/dL (ref 12.0–15.0)
RDW: 13.5 % (ref 11.5–15.5)
WBC: 8.8 10*3/uL (ref 4.0–10.5)

## 2011-02-23 MED ORDER — LACTATED RINGERS IV SOLN
500.0000 mL | Freq: Once | INTRAVENOUS | Status: AC
  Administered 2011-02-23: 1000 mL via INTRAVENOUS

## 2011-02-23 MED ORDER — LACTATED RINGERS IV SOLN
INTRAVENOUS | Status: DC
  Administered 2011-02-23 (×3): via INTRAVENOUS

## 2011-02-23 MED ORDER — OXYTOCIN BOLUS FROM INFUSION
500.0000 mL | Freq: Once | INTRAVENOUS | Status: DC
  Filled 2011-02-23: qty 500

## 2011-02-23 MED ORDER — FENTANYL 2.5 MCG/ML BUPIVACAINE 1/10 % EPIDURAL INFUSION (WH - ANES)
14.0000 mL/h | INTRAMUSCULAR | Status: DC
  Administered 2011-02-23 (×3): 14 mL/h via EPIDURAL
  Filled 2011-02-23 (×3): qty 60

## 2011-02-23 MED ORDER — TERBUTALINE SULFATE 1 MG/ML IJ SOLN: 0.2500 mg | Freq: Once | INTRAMUSCULAR | Status: AC | PRN

## 2011-02-23 MED ORDER — DIPHENHYDRAMINE HCL 50 MG/ML IJ SOLN: 12.5000 mg | INTRAMUSCULAR | Status: DC | PRN

## 2011-02-23 MED ORDER — EPHEDRINE 5 MG/ML INJ: 10.0000 mg | INTRAVENOUS | Status: DC | PRN

## 2011-02-23 MED ORDER — OXYTOCIN 20 UNITS IN LACTATED RINGERS INFUSION - SIMPLE
1.0000 m[IU]/min | INTRAVENOUS | Status: DC
  Administered 2011-02-23: 2 m[IU]/min via INTRAVENOUS
  Filled 2011-02-23: qty 1000

## 2011-02-23 MED ORDER — PHENYLEPHRINE 40 MCG/ML (10ML) SYRINGE FOR IV PUSH (FOR BLOOD PRESSURE SUPPORT): 80.0000 ug | PREFILLED_SYRINGE | INTRAVENOUS | Status: DC | PRN

## 2011-02-23 MED ORDER — LIDOCAINE HCL (PF) 1 % IJ SOLN: 30.0000 mL | INTRAMUSCULAR | Status: DC | PRN

## 2011-02-23 MED ORDER — PHENYLEPHRINE 40 MCG/ML (10ML) SYRINGE FOR IV PUSH (FOR BLOOD PRESSURE SUPPORT)
80.0000 ug | PREFILLED_SYRINGE | INTRAVENOUS | Status: DC | PRN
  Filled 2011-02-23: qty 5

## 2011-02-23 MED ORDER — CITRIC ACID-SODIUM CITRATE 334-500 MG/5ML PO SOLN: 30.0000 mL | ORAL | Status: DC | PRN

## 2011-02-23 MED ORDER — FLEET ENEMA 7-19 GM/118ML RE ENEM: 1.0000 | ENEMA | RECTAL | Status: DC | PRN

## 2011-02-23 MED ORDER — ONDANSETRON HCL 4 MG/2ML IJ SOLN: 4.0000 mg | Freq: Four times a day (QID) | INTRAMUSCULAR | Status: DC | PRN

## 2011-02-23 MED ORDER — ACETAMINOPHEN 325 MG PO TABS: 650.0000 mg | ORAL_TABLET | ORAL | Status: DC | PRN

## 2011-02-23 MED ORDER — OXYCODONE-ACETAMINOPHEN 5-325 MG PO TABS: 2.0000 | ORAL_TABLET | ORAL | Status: DC | PRN

## 2011-02-23 MED ORDER — LACTATED RINGERS IV SOLN: 500.0000 mL | INTRAVENOUS | Status: DC | PRN

## 2011-02-23 MED ORDER — OXYTOCIN 20 UNITS IN LACTATED RINGERS INFUSION - SIMPLE: 125.0000 mL/h | Freq: Once | INTRAVENOUS | Status: DC

## 2011-02-23 MED ORDER — EPHEDRINE 5 MG/ML INJ
10.0000 mg | INTRAVENOUS | Status: DC | PRN
  Filled 2011-02-23: qty 4

## 2011-02-23 MED ORDER — IBUPROFEN 600 MG PO TABS: 600.0000 mg | ORAL_TABLET | Freq: Four times a day (QID) | ORAL | Status: DC | PRN

## 2011-02-23 MED ORDER — LIDOCAINE HCL 1.5 % IJ SOLN
INTRAMUSCULAR | Status: DC | PRN
  Administered 2011-02-23 (×2): 5 mL via EPIDURAL

## 2011-02-23 NOTE — H&P (Signed)
Monique Lyons is a 26 y.o. female presenting for induction of labor secondary to post-term gestation, requests VBAC, had successful VBAC with last pregnancy.  Patient has no complaints, has had some fluid leakage, feeling irregular contractions, reports good fetal movement  [facility], per patient no complications with this pregnancy. Maternal labs wnl.. Maternal Medical History:  Reason for admission: Reason for Admission:   nauseaContractions: Onset was yesterday.   Frequency: regular.   Perceived severity is moderate.    Fetal activity: Perceived fetal activity is normal.   Last perceived fetal movement was within the past hour.    Prenatal Complications - Diabetes: none.    OB History    Grav Para Term Preterm Abortions TAB SAB Ect Mult Living   4 2 2  1     2      Past Medical History  Diagnosis Date  . Ovarian cyst   . No pertinent past medical history    Past Surgical History  Procedure Date  . Cesarean section     first was c-section; second vbac  . Cesarean section December 28, 2003   Family History: family history includes Endometriosis in her mother. Social History:  reports that she has been smoking Cigarettes.  She has never used smokeless tobacco. She reports that she does not drink alcohol or use illicit drugs.  Review of Systems  Constitutional: Negative for fever and chills.  HENT: Positive for congestion.   Eyes: Negative for blurred vision and double vision.  Respiratory: Negative for cough and shortness of breath.   Cardiovascular: Negative for chest pain and palpitations.  Gastrointestinal: Negative for heartburn, nausea, vomiting, abdominal pain, diarrhea and constipation.  Genitourinary: Negative for dysuria and urgency.  Neurological: Negative for headaches.  All other systems reviewed and are negative.      Blood pressure 120/65, pulse 60, temperature 97.7 F (36.5 C), temperature source Oral, resp. rate 20,  weight 70.761 kg (156 lb). Maternal Exam:  Uterine Assessment: Contraction strength is mild.    Physical Exam  Constitutional: She is oriented to person, place, and time. She appears well-developed and well-nourished. No distress.  HENT:  Head: Normocephalic and atraumatic.  Eyes: EOM are normal. Pupils are equal, round, and reactive to light.  Cardiovascular: Normal rate, regular rhythm, normal heart sounds and intact distal pulses.  Exam reveals no gallop and no friction rub.   No murmur heard. Respiratory: Effort normal and breath sounds normal. No respiratory distress. She has no wheezes. She has no rales.  GI: Soft. Bowel sounds are normal. She exhibits no distension and no mass. There is no tenderness. There is no rebound and no guarding.       Gravid   Musculoskeletal: Normal range of motion. She exhibits no edema and no tenderness.  Neurological: She is alert and oriented to person, place, and time.  Skin: Skin is warm and dry. No rash noted. She is not diaphoretic. No erythema. No pallor.  Psychiatric: She has a normal mood and affect. Her behavior is normal. Judgment and thought content normal.    Prenatal labs: ABO, Rh: --/--/O POS (07/03 0145) Antibody: NEG (11/28 1014) Rubella:  immune RPR: NON REAC (11/28 1014)  HBsAg: NEGATIVE (11/28 1014)  HIV: NON REACTIVE (11/28 1014)  GBS:   negative  Assessment/Plan: 1) labor  - admit to L&D  - expectant management  - anticipate SVD  Cameron Proud 02/23/2011, 9:35 AM

## 2011-02-23 NOTE — Progress Notes (Signed)
Monique Lyons is a 26 y.o. [id number] at [redacted]w[redacted]d by ultrasound admitted for induction of labor due to Post dates.  Subjective: Patient doing well, no complaints. Awaiting epidural. Feeling contractions more. Denies any headache, visual changes  Objective: BP 107/69  Pulse 56  Temp(Src) 97.7 F (36.5 C) (Oral)  Resp 20  Ht 5\' 9"  (1.753 m)  Wt 70.761 kg (156 lb)  BMI 23.04 kg/m2      FHT:  FHR: 150-160 bpm, variability: moderate,  accelerations:  Present,  decelerations:  Absent UC:   irregular, every 3-5 minutes SVE:   Dilation: 3 Effacement (%): 70 Station: -2 Exam by:: Paul Dykes RNC  Labs: Lab Results  Component Value Date   WBC 8.8 02/23/2011   HGB 12.1 02/23/2011   HCT 36.6 02/23/2011   MCV 90.6 02/23/2011   PLT 193 02/23/2011    Assessment / Plan: Induction of labor due to postterm,  progressing well on pitocin  Labor: Progressing on Pitocin, will continue to increase then AROM Preeclampsia:  no SSx Fetal Wellbeing:  Category I Pain Control:  Epidural I/D:  n/a Anticipated MOD:  NSVD  Cameron Proud 02/23/2011, 2:49 PM

## 2011-02-23 NOTE — Progress Notes (Signed)
Monique Lyons is a 26 y.o. [id number] at [redacted]w[redacted]d by ultrasound admitted for induction of labor due to Post dates. Due date 02/16/11.  Subjective:   Objective: BP 115/54  Pulse 64  Temp(Src) 97.8 F (36.6 C) (Oral)  Resp 20  Ht 5\' 9"  (1.753 m)  Wt 70.761 kg (156 lb)  BMI 23.04 kg/m2      FHT:  FHR: 135 bpm, variability: absent,  accelerations:  Abscent,  decelerations:  Absent UC:   regular, every 2 minutes SVE:   Dilation: 5 Effacement (%): 70 Station: -1 Exam by:: Paul Dykes RNC  Labs: Lab Results  Component Value Date   WBC 8.8 02/23/2011   HGB 12.1 02/23/2011   HCT 36.6 02/23/2011   MCV 90.6 02/23/2011   PLT 193 02/23/2011    Assessment / Plan: Induction of labor due to postterm,  progressing well on pitocin  Labor: Progressing normally Preeclampsia:  no signs or symptoms of toxicity Fetal Wellbeing:  Category I Pain Control:  Epidural I/D:  n/a Anticipated MOD:  NSVD  Monique Lyons 02/23/2011, 7:25 PM

## 2011-02-23 NOTE — Anesthesia Preprocedure Evaluation (Signed)

## 2011-02-23 NOTE — Anesthesia Procedure Notes (Signed)
Epidural Patient location during procedure: OB Start time: 02/23/2011 2:42 PM  Staffing Anesthesiologist: Brayton Caves R Performed by: anesthesiologist   Preanesthetic Checklist Completed: patient identified, site marked, surgical consent, pre-op evaluation, timeout performed, IV checked, risks and benefits discussed and monitors and equipment checked  Epidural Patient position: sitting Prep: site prepped and draped and DuraPrep Patient monitoring: continuous pulse ox and blood pressure Approach: midline Injection technique: LOR air and LOR saline  Needle:  Needle type: Tuohy  Needle gauge: 17 G Needle length: 9 cm Needle insertion depth: 5 cm cm Catheter type: closed end flexible Catheter size: 19 Gauge Catheter at skin depth: 10 cm Test dose: negative  Assessment Events: blood not aspirated, injection not painful, no injection resistance, negative IV test and no paresthesia  Additional Notes Patient identified.  Risk benefits discussed including failed block, incomplete pain control, headache, nerve damage, paralysis, blood pressure changes, nausea, vomiting, reactions to medication both toxic or allergic, and postpartum back pain.  Patient expressed understanding and wished to proceed.  All questions were answered.  Sterile technique used throughout procedure and epidural site dressed with sterile barrier dressing. No paresthesia or other complications noted.The patient did not experience any signs of intravascular injection such as tinnitus or metallic taste in mouth nor signs of intrathecal spread such as rapid motor block. Please see nursing notes for vital signs.

## 2011-02-23 NOTE — Progress Notes (Signed)
Patient converted to telemetry monitor to ambulate

## 2011-02-23 NOTE — Progress Notes (Signed)
S: Feeling pelvic/rectal pressure w/ UC's- has epidural  O: VSS      FHR: 125, moderate variability, 15x15, no decels= Cat I      UC: q 2-5, moderate, on Pitocin 64mu/min      SVE: 5/90/-1 to 0, no fluid noted at present time  A: IOL for postdates @ 41.0wks- progressing normally on Pitocin 79mu/min     Stable maternal/fetal unit  P: May hit epidural pca button to help w/ pressure     Titrate ivf w/ pitocin to equal total ivf per hr      Increase pitocin as needed  Joellyn Haff, SNM

## 2011-02-23 NOTE — Progress Notes (Signed)
CHAVONNE SFORZA is a 26 y.o. [id number] at [redacted]w[redacted]d by LMP admitted for postdates   Subjective: Patient comfortable, no complaints, epidural placed. Feeling pressure and contractions. Denies any fever, headache, visual changes.   Objective: BP 115/73  Pulse 55  Temp(Src) 97.7 F (36.5 C) (Oral)  Resp 20  Ht 5\' 9"  (1.753 m)  Wt 70.761 kg (156 lb)  BMI 23.04 kg/m2      FHT:  FHR: 160-140 bpm, variability: moderate,  accelerations:  Present,  decelerations:  Absent UC:   irregular, every 2-3 minutes SVE:   Dilation: 5 Effacement (%): 70 Station: -1 Exam by:: Paul Dykes RNC  Labs: Lab Results  Component Value Date   WBC 8.8 02/23/2011   HGB 12.1 02/23/2011   HCT 36.6 02/23/2011   MCV 90.6 02/23/2011   PLT 193 02/23/2011    Assessment / Plan: Induction of labor due to postterm,  progressing well on pitocin  Labor: Progressing on Pitocin, will continue to increase then AROM Preeclampsia:  no SSx Fetal Wellbeing:  Category I Pain Control:  Epidural I/D:  n/a Anticipated MOD:  NSVD  Cameron Proud 02/23/2011, 6:43 PM

## 2011-02-23 NOTE — Progress Notes (Signed)
Dr Sheral Apley in room for epidural placement as pt going to bathroom.  He left telling pt he will return in a few minutes

## 2011-02-24 MED ORDER — IBUPROFEN 600 MG PO TABS
600.0000 mg | ORAL_TABLET | Freq: Four times a day (QID) | ORAL | Status: DC
  Administered 2011-02-24 – 2011-02-25 (×7): 600 mg via ORAL
  Filled 2011-02-24 (×7): qty 1

## 2011-02-24 MED ORDER — DIPHENHYDRAMINE HCL 25 MG PO CAPS: 25.0000 mg | ORAL_CAPSULE | Freq: Four times a day (QID) | ORAL | Status: DC | PRN

## 2011-02-24 MED ORDER — LANOLIN HYDROUS EX OINT: TOPICAL_OINTMENT | CUTANEOUS | Status: DC | PRN

## 2011-02-24 MED ORDER — BENZOCAINE-MENTHOL 20-0.5 % EX AERO: 1.0000 "application " | INHALATION_SPRAY | CUTANEOUS | Status: DC | PRN

## 2011-02-24 MED ORDER — ONDANSETRON HCL 4 MG PO TABS: 4.0000 mg | ORAL_TABLET | ORAL | Status: DC | PRN

## 2011-02-24 MED ORDER — WITCH HAZEL-GLYCERIN EX PADS: 1.0000 "application " | MEDICATED_PAD | CUTANEOUS | Status: DC | PRN

## 2011-02-24 MED ORDER — DIBUCAINE 1 % RE OINT: 1.0000 "application " | TOPICAL_OINTMENT | RECTAL | Status: DC | PRN

## 2011-02-24 MED ORDER — OXYCODONE-ACETAMINOPHEN 5-325 MG PO TABS
1.0000 | ORAL_TABLET | ORAL | Status: DC | PRN
  Administered 2011-02-24 (×3): 2 via ORAL
  Administered 2011-02-24 (×2): 1 via ORAL
  Administered 2011-02-25 (×2): 2 via ORAL
  Filled 2011-02-24 (×2): qty 2
  Filled 2011-02-24: qty 1
  Filled 2011-02-24 (×3): qty 2
  Filled 2011-02-24: qty 1

## 2011-02-24 MED ORDER — ONDANSETRON HCL 4 MG/2ML IJ SOLN: 4.0000 mg | INTRAMUSCULAR | Status: DC | PRN

## 2011-02-24 MED ORDER — TETANUS-DIPHTH-ACELL PERTUSSIS 5-2.5-18.5 LF-MCG/0.5 IM SUSP: 0.5000 mL | Freq: Once | INTRAMUSCULAR | Status: DC

## 2011-02-24 MED ORDER — ZOLPIDEM TARTRATE 5 MG PO TABS: 5.0000 mg | ORAL_TABLET | Freq: Every evening | ORAL | Status: DC | PRN

## 2011-02-24 MED ORDER — PRENATAL MULTIVITAMIN CH
1.0000 | ORAL_TABLET | Freq: Every day | ORAL | Status: DC
  Administered 2011-02-24 – 2011-02-25 (×2): 1 via ORAL
  Filled 2011-02-24 (×2): qty 1

## 2011-02-24 MED ORDER — BENZOCAINE-MENTHOL 20-0.5 % EX AERO
INHALATION_SPRAY | CUTANEOUS | Status: AC
  Administered 2011-02-24: 02:00:00
  Filled 2011-02-24: qty 56

## 2011-02-24 MED ORDER — SENNOSIDES-DOCUSATE SODIUM 8.6-50 MG PO TABS
2.0000 | ORAL_TABLET | Freq: Every day | ORAL | Status: DC
  Administered 2011-02-24: 2 via ORAL

## 2011-02-24 MED ORDER — SIMETHICONE 80 MG PO CHEW: 80.0000 mg | CHEWABLE_TABLET | ORAL | Status: DC | PRN

## 2011-02-24 NOTE — Progress Notes (Signed)
Post Partum Day 1 Subjective: no complaints, up ad lib, voiding and tolerating PO  Objective: Blood pressure 103/64, pulse 73, temperature 98.1 F (36.7 C), temperature source Oral, resp. rate 18, height 5\' 9"  (1.753 m), weight 70.761 kg (156 lb), unknown if currently breastfeeding.  Physical Exam:  General: alert, cooperative, appears stated age and no distress Lochia: appropriate Uterine Fundus: firm Incision: n/a DVT Evaluation: No evidence of DVT seen on physical exam. Negative Homan's sign. No cords or calf tenderness. No significant calf/ankle edema.   Basename 02/23/11 0810  HGB 12.1  HCT 36.6    Assessment/Plan: Plan for discharge tomorrow   LOS: 1 day   [name] 02/24/2011, 6:49 AM

## 2011-02-24 NOTE — Anesthesia Postprocedure Evaluation (Signed)
  Anesthesia Post-op Note  Patient: Monique Lyons  Procedure(s) Performed: * No procedures listed *  Patient Location: PACU and Mother/Baby  Anesthesia Type: Epidural  Level of Consciousness: awake, alert  and oriented  Airway and Oxygen Therapy: Patient Spontanous Breathing  Post-op Pain: none  Post-op Assessment: Post-op Vital signs reviewed  Post-op Vital Signs: Reviewed and stable  Complications: No apparent anesthesia complications

## 2011-02-24 NOTE — Progress Notes (Signed)
UR chart review completed.  

## 2011-02-24 NOTE — Progress Notes (Signed)
PSYCHOSOCIAL ASSESSMENT ~ MATERNAL/CHILD  Name: Monique Lyons Age: 26  Referral Date: 02/24/11  Reason/Source: [facility] / CN  I. FAMILY/HOME ENVIRONMENT  A. Child's Legal Guardian __X_Parent(s) ___Grandparent ___Foster parent ___DSS_________________  Name: Monique Lyons DOB: // Age: 56  Address: [address] ; [place], [place] [address]  Name: Monique Lyons DOB: // Age: 80  Address: (same as above)  B. Other Household Members/Support Persons Name: Relationship: Son; 7 DOB ___/___/___  Name: Relationship: Daughter; 2 DOB ___/___/___  Name: Relationship: DOB ___/___/___  Name: Relationship: DOB ___/___/___  C. Other Support:  II. PSYCHOSOCIAL DATA A. Information Source _X_Patient Interview __Family Interview __Other___________ B. Event organiser __Employment:  _X_Medicaid [place]: [place] __Private Insurance: __Self Pay  _X_Food Stamps __WIC *may apply __Work First __Public Housing __Section 8  __Maternity Care Coordination/Child Service Coordination/Early Intervention  ___School: Grade:  __Other:  Salena Saner Cultural and Environment Information Cultural Issues Impacting Care:  III. STRENGTHS _X__Supportive family/friends  _X__Adequate Resources  ___Compliance with medical plan  _X__Home prepared for Child (including basic supplies)  ___Understanding of illness  ___Other:  RISK FACTORS AND CURRENT PROBLEMS ____No Problems Noted  LPNC @ 33wk  IV. SOCIAL WORK ASSESSMENT Pt told Sw that she received pregnancy confirmation at 3 months. Pt applied for Medicaid and had to wait until approval. Once Medicaid was received, she attended all her appointment regularly. She denies any illegal substance use. UDS is negative, meconium is pending. Pt does have a history with CPS (last year) but states someone called in false report. Pt's children were never removed from her care. FOB is at the bedside and supportive. Pt has supplies for the infant and appear appropriate. Sw will follow up  with drug screen results and make a referral if needed.  V. SOCIAL WORK PLAN _X__No Further Intervention Required/No Barriers to Discharge  ___Psychosocial Support and Ongoing Assessment of Needs  ___Patient/Family Education:  ___Child Protective Services Report County___________ Date___/____/____  ___Information/Referral to [employer]_________________________  ___Other:

## 2011-02-25 MED ORDER — OXYCODONE-ACETAMINOPHEN 5-325 MG PO TABS: 1.0000 | ORAL_TABLET | ORAL | Status: AC | PRN

## 2011-02-25 MED ORDER — IBUPROFEN 600 MG PO TABS: 600.0000 mg | ORAL_TABLET | Freq: Four times a day (QID) | ORAL | Status: AC

## 2011-02-25 NOTE — Discharge Summary (Signed)
Obstetric Discharge Summary Reason for Admission: induction of labor due to being post-dates Prenatal Procedures: CST Intrapartum Procedures: spontaneous vaginal delivery Postpartum Procedures: none Complications-Operative and Postpartum: 1st degree perineal laceration Hemoglobin  Date Value Range Status  02/23/2011 12.1  12.0-15.0 (g/dL) Final     HCT  Date Value Range Status  02/23/2011 36.6  36.0-46.0 (%) Final    Discharge Diagnoses: Term Pregnancy-delivered  Discharge Information: Date: 02/25/2011 Activity: pelvic rest Diet: routine Medications: PNV and Ibuprofen Condition: stable Instructions: refer to practice specific booklet Discharge to: home Follow-up Information    Follow up with [facility]. (Please call today and schedule a post-partum visit for 2 weeks to discuss your tubal procedure.)          Newborn Data: Live born female  Birth Weight: 7 lb 1.2 oz (3210 g) APGAR: 8, 9  Home with mother.   OH PARK, ANGELA 02/25/2011, 8:35 AM

## 2011-02-25 NOTE — Progress Notes (Signed)
Post Partum Day 2 Subjective: no complaints, up ad lib, voiding, tolerating PO, + flatus and breastfeeding and has questions. Concerned "milk not coming out".  Objective: Blood pressure 94/56, pulse 67, temperature 97.6 F (36.4 C), temperature source Oral, resp. rate 18, height 5\' 9"  (1.753 m), weight 156 lb (70.761 kg), unknown if currently breastfeeding.  Physical Exam:  General: alert and no distress Lochia: appropriate Uterine Fundus: firm Incision:  DVT Evaluation: No evidence of DVT seen on physical exam. No cords or calf tenderness. No significant calf/ankle edema.   Basename 02/23/11 0810  HGB 12.1  HCT 36.6    Assessment/Plan: Discharge home today Bottle-feeding Female infant: circumcision as outpatient. Will provide list of local providers. Contraception: BTL papers signed during hospitalization. Micronor in the meantime.  Pain medication: percocet and ibuprofen prn.  Follow-up: Health Department    LOS: 2 days   OH PARK, ANGELA 02/25/2011, 7:33 AM

## 2011-03-11 NOTE — Telephone Encounter (Signed)
Preadmission screen  

## 2011-04-02 ENCOUNTER — Ambulatory Visit: Payer: Medicaid Other | Admitting: Obstetrics and Gynecology

## 2011-04-23 ENCOUNTER — Ambulatory Visit (INDEPENDENT_AMBULATORY_CARE_PROVIDER_SITE_OTHER): Payer: Medicaid Other | Admitting: Advanced Practice Midwife

## 2011-04-23 ENCOUNTER — Encounter: Payer: Self-pay | Admitting: Advanced Practice Midwife

## 2011-04-23 VITALS — BP 117/85 | HR 76 | Temp 97.7°F | Ht 69.0 in | Wt 156.1 lb

## 2011-04-23 DIAGNOSIS — J029 Acute pharyngitis, unspecified: Secondary | ICD-10-CM

## 2011-04-23 MED ORDER — NORELGESTROMIN-ETH ESTRADIOL 150-35 MCG/24HR TD PTWK: 1.0000 | MEDICATED_PATCH | TRANSDERMAL | Status: DC

## 2011-04-23 NOTE — Progress Notes (Addendum)
  Subjective:    Patient ID: Monique Lyons, female    DOB: [birth year], 26 y.o.   MRN: [id number]  HPI Patient presenting for 6 week postpartum follow up.  Patient complains of sore throat, HA, eye puffiness and pain, ear drainage, swollen lymph nodes, body aches, lightheadedness, dizziness, SOB, facial tingling, and blurred and double vision for approximately 6 weeks.  Her symptoms are worse of the right when compared to the left.  Patient states the eye puffiness and pain are worse in the morning.  She denies chest pain, fever, n/v, or diarrhea.  She has tried single doses of Zyrtec, Sudafed, and Allegra.  Her mother recently had strep throat. She has no gyn complaints.  She breast feed for 2 weeks then switched to bottle.  She wants to restart contraceptive patches.  She was on Ortho Evra.    Review of Systems  Constitutional: Positive for chills and fatigue. Negative for fever.  HENT: Positive for sore throat and rhinorrhea.   Eyes: Positive for pain and visual disturbance (blurred and double).  Respiratory: Positive for shortness of breath.   Cardiovascular: Negative for chest pain.  Gastrointestinal: Negative for nausea, vomiting and diarrhea.  Musculoskeletal: Positive for myalgias.  Neurological: Positive for dizziness, light-headedness and headaches.      Objective:   Physical Exam  Constitutional: She is oriented to person, place, and time. She appears well-developed and well-nourished.  HENT:  Head: Normocephalic and atraumatic.  Eyes: Conjunctivae are normal. Right eye exhibits no discharge. Left eye exhibits no discharge.  Neck: Neck supple.  Cardiovascular: Normal rate, regular rhythm, normal heart sounds and intact distal pulses.  Exam reveals no gallop and no friction rub.   No murmur heard. Pulmonary/Chest: Effort normal and breath sounds normal. No respiratory distress. She has no wheezes. She has no rales. She exhibits no tenderness.  Genitourinary: Vagina normal. No  vaginal discharge found.  Musculoskeletal: She exhibits no tenderness.  Lymphadenopathy:    She has cervical adenopathy (slightly on right side).  Neurological: She is alert and oriented to person, place, and time.  Skin: Skin is warm and dry. No rash noted. No erythema. No pallor.  Cervix: closed, No tenderness with palpation of cervix or adnexa.  No masses noted.    Assessment & Plan:  26 yo [id number] presenting for postpartum care.  Patient will restart Ortho Evra. URI most likely viral or allergic in nature.  Suggested OTC Zyrtec or Claritin daily use for 1-2 weeks. Will culture throat for possible Strep.  Seen by me. Agree with note Wynelle Bourgeois CNM

## 2011-04-23 NOTE — Progress Notes (Signed)
Pt c/o pressure behind the eyes, swollen glands, drainage from the ears.

## 2011-04-23 NOTE — Patient Instructions (Signed)
Sore Throat Sore throats may be caused by bacteria and viruses. They may also be caused by:  Smoking.   Pollution.   Allergies.  If a sore throat is due to strep infection (a bacterial infection), you may need:  A throat swab.   A culture test to verify the strep infection.  You will need one of these:  An antibiotic shot.   Oral medicine for a full 10 days.  Strep infection is very contagious. A doctor should check any close contacts who have a sore throat or fever. A sore throat caused by a virus infection will usually last only 3-4 days. Antibiotics will not treat a viral sore throat.  Infectious mononucleosis (a viral disease), however, can cause a sore throat that lasts for up to 3 weeks. Mononucleosis can be diagnosed with blood tests. You must have been sick for at least 1 week in order for the test to give accurate results. HOME CARE INSTRUCTIONS   To treat a sore throat, take mild pain medicine.   Increase your fluids.   Eat a soft diet.   Do not smoke.   Gargling with warm water or salt water (1 tsp. salt in 8 oz. water) can be helpful.   Try throat sprays or lozenges or sucking on hard candy to ease the symptoms.  Call your doctor if your sore throat lasts longer than 1 week.  SEEK IMMEDIATE MEDICAL CARE IF:  You have difficulty breathing.   You have increased swelling in the throat.   You have pain so severe that you are unable to swallow fluids or your saliva.   You have a severe headache, a high fever, vomiting, or a red rash.  Document Released: 02/28/2004 Document Revised: 01/09/2011 Document Reviewed: 01/07/2007 [facility] Patient Information 2012 [place], [place].Headache and Allergies The relationship between allergies and headaches is unclear. Many people with allergic or infectious nasal problems also have headaches (migraines or sinus headaches). However, sometimes allergies can cause pressure that feels like a headache, and sometimes headaches can  cause allergy-like symptoms. It is not always clear whether your symptoms are caused by allergies or by a headache. CAUSES   Migraine: The cause of a migraine is not always known.   Sinus Headache: The cause of a sinus headache may be a sinus infection. Other conditions that may be related to sinus headaches include:   Hay fever (allergic rhinitis).   Deviation of the nasal septum.   Swelling or clogging of the nasal passages.  SYMPTOMS  Migraine headache symptoms (which often last 4 to 72 hours) include:  Intense, throbbing pain on one or both sides of the head.   Nausea.   Vomiting.   Being extra sensitive to light.   Being extra sensitive to sound.   Nervous system reactions that appear similar to an allergic reaction:   Stuffy nose.   Runny nose.   Tearing.  Sinus headaches are felt as facial pain or pressure.  DIAGNOSIS  Because there is some overlap in symptoms, sinus and migraine headaches are often misdiagnosed. For example, a person with migraines may also feel facial pressure. Likewise, many people with hay fever may get migraine headaches rather than sinus headaches. These migraines can be triggered by the histamine release during an allergic reaction. An antihistamine medicine can eliminate this pain. There are standard criteria that help clarify the difference between these headaches and related allergy or allergy-like symptoms. Your caregiver can use these criteria to determine the proper diagnosis and provide  you the best care. TREATMENT  Migraine medicine may help people who have persistent migraine headaches even though their hay fever is controlled. For some people, anti-inflammatory treatments do not work to relieve migraines. Medicines called triptans (such as sumatriptan) can be helpful for those people. Document Released: 04/12/2003 Document Revised: 01/09/2011 Document Reviewed: 05/04/2009 [facility] Patient Information 2012 [place], [place].

## 2011-04-24 LAB — STREP A DNA PROBE: GASP: NEGATIVE

## 2011-04-28 ENCOUNTER — Telehealth: Payer: Self-pay | Admitting: Obstetrics and Gynecology

## 2011-04-28 NOTE — Telephone Encounter (Signed)
Called pt and left message that her test result was negative.

## 2011-04-28 NOTE — Telephone Encounter (Signed)
Patient called with message requesting strep test result.

## 2011-08-22 ENCOUNTER — Encounter (HOSPITAL_COMMUNITY): Payer: Self-pay

## 2011-08-22 ENCOUNTER — Emergency Department (HOSPITAL_COMMUNITY)
Admission: EM | Admit: 2011-08-22 | Discharge: 2011-08-22 | Disposition: A | Discharge status: Home / Self Care | Payer: Self-pay | Attending: Emergency Medicine | Admitting: Emergency Medicine

## 2011-08-22 ENCOUNTER — Emergency Department (HOSPITAL_COMMUNITY): Payer: Self-pay

## 2011-08-22 DIAGNOSIS — M549 Dorsalgia, unspecified: Secondary | ICD-10-CM | POA: Insufficient documentation

## 2011-08-22 DIAGNOSIS — W19XXXA Unspecified fall, initial encounter: Secondary | ICD-10-CM

## 2011-08-22 DIAGNOSIS — F17200 Nicotine dependence, unspecified, uncomplicated: Secondary | ICD-10-CM | POA: Insufficient documentation

## 2011-08-22 DIAGNOSIS — M25559 Pain in unspecified hip: Secondary | ICD-10-CM

## 2011-08-22 MED ORDER — IBUPROFEN 800 MG PO TABS
800.0000 mg | ORAL_TABLET | Freq: Once | ORAL | Status: AC
  Administered 2011-08-22: 800 mg via ORAL
  Filled 2011-08-22: qty 1

## 2011-08-22 MED ORDER — IBUPROFEN 800 MG PO TABS: 800.0000 mg | ORAL_TABLET | Freq: Three times a day (TID) | ORAL | Status: AC

## 2011-08-22 MED ORDER — HYDROCODONE-ACETAMINOPHEN 5-325 MG PO TABS
1.0000 | ORAL_TABLET | Freq: Once | ORAL | Status: AC
  Administered 2011-08-22: 1 via ORAL
  Filled 2011-08-22: qty 1

## 2011-08-22 NOTE — ED Notes (Signed)
Patient transported to X-ray 

## 2011-08-22 NOTE — ED Notes (Signed)
Tripped last night and fell, complains of left hand pain and left lower back pain.

## 2011-08-22 NOTE — ED Provider Notes (Signed)
History     CSN: [id number]  Arrival date & time 08/22/11  4098   First MD Initiated Contact with Patient 08/22/11 1028      Chief Complaint  Patient presents with  . Back Pain  . Hand Pain    (Consider location/radiation/quality/duration/timing/severity/associated sxs/prior treatment) HPI The patient presents one day after a fall with pain in her left forearm and left superior posterior hip.  She notes that she fell against a railing, yesterday.  The falls mechanical.  She denies any syncope, chest pain, dyspnea prior to or following a fall.  Since that time she said pain persistently in the left forearm and left hip. The left forearm pain was posterior, distal, worse with motion, sore. The left hip pain is posterior superior iliac crest, focally, sharp, sore, worse with any motion or weight-bearing.  The patient is ambulatory.  She denies any ongoing dysuria, incontinence, nausea, vomiting, fevers, chills. She also denies any distal dysesthesia. The patient notes minimal improvement with Tylenol at home. Past Medical History  Diagnosis Date  . Ovarian cyst   . No pertinent past medical history     Past Surgical History  Procedure Date  . Cesarean section     first was c-section; second vbac  . Cesarean section December 28, 2003    Family History  Problem Relation Age of Onset  . Endometriosis Mother     History  Substance Use Topics  . Smoking status: Current Some Day Smoker    Types: Cigarettes  . Smokeless tobacco: Never Used  . Alcohol Use: No    OB History    Grav Para Term Preterm Abortions TAB SAB Ect Mult Living   4 3 3  1     3       Review of Systems  Constitutional:       HPI  HENT:       HPI otherwise negative  Eyes: Negative.   Respiratory:       HPI, otherwise negative  Cardiovascular:       HPI, otherwise nmegative  Gastrointestinal: Negative for vomiting.  Genitourinary:       HPI, otherwise negative  Musculoskeletal:       HPI,  otherwise negative  Skin: Negative.   Neurological: Negative for syncope.    Allergies  Amoxicillin and Penicillins  Home Medications   Current Outpatient Rx  Name Route Sig Dispense Refill  . FERROUS SULFATE 325 (65 FE) MG PO TABS Oral Take 325 mg by mouth daily with breakfast.    . ADULT MULTIVITAMIN W/MINERALS CH Oral Take 1 tablet by mouth daily.    Marland Kitchen HAIR/SKIN/NAILS PO Oral Take 2 tablets by mouth daily.      BP 131/84  Pulse 80  Temp 98.3 F (36.8 C) (Oral)  Resp 16  Ht 5\' 9"  (1.753 m)  Wt 156 lb (70.761 kg)  BMI 23.04 kg/m2  SpO2 98%  Breastfeeding? No  Physical Exam  Nursing note and vitals reviewed. Constitutional: She is oriented to person, place, and time. She appears well-developed and well-nourished. No distress.  HENT:  Head: Normocephalic and atraumatic.  Eyes: Conjunctivae and EOM are normal.  Cardiovascular: Normal rate and regular rhythm.   Pulmonary/Chest: Effort normal and breath sounds normal. No stridor. No respiratory distress.  Abdominal: She exhibits no distension.  Musculoskeletal: She exhibits no edema.       Left knee: Normal.       Left ankle: Normal.       Back:  Arms: Neurological: She is alert and oriented to person, place, and time. No cranial nerve deficit.  Skin: Skin is warm and dry.  Psychiatric: She has a normal mood and affect.    ED Course  Procedures (including critical care time)  Labs Reviewed - No data to display No results found.   No diagnosis found.    MDM  This young, otherwise well, female now presents with pain following a mechanical fall 1 day ago.  On exam she is in no distress with unremarkable vital signs.  The patient's radiographic studies were reassuring.  She was discharged in stable condition.    Gerhard Munch, MD 08/22/11 1134

## 2012-02-04 NOTE — L&D Delivery Note (Signed)
Delivery Note At 1:09 AM a viable female was delivered via Vaginal, Spontaneous Delivery (Presentation: Left Occiput Anterior).  APGAR: 9, 9; weight 6 lb 0.3 oz (2731 g).   Placenta status: Intact, Spontaneous.  Cord:  with the following complications: None  Anesthesia: Epidural  Episiotomy: None Lacerations: None Suture Repair: n/a Est. Blood Loss (mL): 200  Mom to postpartum.  Baby to nursery-stable.  Jacquelin Hawking, MD 10/16/2012, 3:02 AM

## 2012-02-04 NOTE — L&D Delivery Note (Signed)
I have seen and examined this patient and I agree with the above. Cam Hai 6:16 AM 10/16/2012

## 2012-04-27 ENCOUNTER — Other Ambulatory Visit (INDEPENDENT_AMBULATORY_CARE_PROVIDER_SITE_OTHER): Payer: Medicaid Other

## 2012-04-27 DIAGNOSIS — Z3201 Encounter for pregnancy test, result positive: Secondary | ICD-10-CM

## 2012-04-27 DIAGNOSIS — Z3200 Encounter for pregnancy test, result unknown: Secondary | ICD-10-CM

## 2012-04-27 NOTE — Addendum Note (Signed)
Addended by: Franchot Mimes on: 04/27/2012 09:43 AM   Modules accepted: Orders

## 2012-04-27 NOTE — Progress Notes (Signed)
Pos UPT today- unsure LMP. Pt plans Ob care in this office. New Ob labs drawn, [place] scheduled 04/29/12 @ 1030

## 2012-04-28 LAB — OBSTETRIC PANEL
Hemoglobin: 12 g/dL (ref 12.0–15.0)
Hepatitis B Surface Ag: NEGATIVE
Lymphocytes Relative: 27 % (ref 12–46)
Lymphs Abs: 1.7 10*3/uL (ref 0.7–4.0)
Monocytes Relative: 7 % (ref 3–12)
Neutro Abs: 4.1 10*3/uL (ref 1.7–7.7)
Neutrophils Relative %: 65 % (ref 43–77)
Platelets: 246 10*3/uL (ref 150–400)
RBC: 4.07 MIL/uL (ref 3.87–5.11)
Rubella: 2.98 Index — ABNORMAL HIGH (ref <0.90)
WBC: 6.3 10*3/uL (ref 4.0–10.5)

## 2012-04-29 ENCOUNTER — Ambulatory Visit (HOSPITAL_COMMUNITY): Admission: RE | Admit: 2012-04-29 | Payer: Medicaid Other | Source: Ambulatory Visit

## 2012-04-29 LAB — HEMOGLOBINOPATHY EVALUATION
Hemoglobin Other: 0 %
Hgb A: 96.8 % (ref 96.8–97.8)
Hgb S Quant: 0 %

## 2012-05-04 ENCOUNTER — Other Ambulatory Visit (HOSPITAL_COMMUNITY): Payer: Medicaid Other

## 2012-05-05 ENCOUNTER — Other Ambulatory Visit: Payer: Self-pay | Admitting: Obstetrics & Gynecology

## 2012-05-05 ENCOUNTER — Ambulatory Visit (HOSPITAL_COMMUNITY)
Admission: RE | Admit: 2012-05-05 | Discharge: 2012-05-05 | Disposition: A | Discharge status: Home / Self Care | Payer: Medicaid Other | Source: Ambulatory Visit | Attending: Obstetrics & Gynecology | Admitting: Obstetrics & Gynecology

## 2012-05-05 DIAGNOSIS — Z3200 Encounter for pregnancy test, result unknown: Secondary | ICD-10-CM

## 2012-05-05 DIAGNOSIS — Z3689 Encounter for other specified antenatal screening: Secondary | ICD-10-CM | POA: Insufficient documentation

## 2012-05-06 ENCOUNTER — Encounter: Payer: Self-pay | Admitting: *Deleted

## 2012-06-01 ENCOUNTER — Ambulatory Visit (INDEPENDENT_AMBULATORY_CARE_PROVIDER_SITE_OTHER): Payer: Medicaid Other | Admitting: Obstetrics & Gynecology

## 2012-06-01 ENCOUNTER — Other Ambulatory Visit (HOSPITAL_COMMUNITY)
Admission: RE | Admit: 2012-06-01 | Discharge: 2012-06-01 | Disposition: A | Discharge status: Home / Self Care | Payer: Medicaid Other | Source: Ambulatory Visit | Attending: Obstetrics & Gynecology | Admitting: Obstetrics & Gynecology

## 2012-06-01 ENCOUNTER — Encounter: Payer: Self-pay | Admitting: Obstetrics & Gynecology

## 2012-06-01 VITALS — Ht 68.5 in

## 2012-06-01 DIAGNOSIS — Z01419 Encounter for gynecological examination (general) (routine) without abnormal findings: Secondary | ICD-10-CM | POA: Insufficient documentation

## 2012-06-01 DIAGNOSIS — O368390 Maternal care for abnormalities of the fetal heart rate or rhythm, unspecified trimester, not applicable or unspecified: Secondary | ICD-10-CM

## 2012-06-01 DIAGNOSIS — Z113 Encounter for screening for infections with a predominantly sexual mode of transmission: Secondary | ICD-10-CM | POA: Insufficient documentation

## 2012-06-01 DIAGNOSIS — O0930 Supervision of pregnancy with insufficient antenatal care, unspecified trimester: Secondary | ICD-10-CM

## 2012-06-01 DIAGNOSIS — O0932 Supervision of pregnancy with insufficient antenatal care, second trimester: Secondary | ICD-10-CM

## 2012-06-01 DIAGNOSIS — Z3481 Encounter for supervision of other normal pregnancy, first trimester: Secondary | ICD-10-CM

## 2012-06-01 LAB — POCT URINALYSIS DIP (DEVICE)
Bilirubin Urine: NEGATIVE
Glucose, UA: NEGATIVE mg/dL
Ketones, ur: NEGATIVE mg/dL
Leukocytes, UA: NEGATIVE
Protein, ur: NEGATIVE mg/dL

## 2012-06-01 NOTE — Patient Instructions (Signed)
Smoking Cessation, Tips for Success YOU CAN QUIT SMOKING If you are ready to quit smoking, congratulations! You have chosen to help yourself be healthier. Cigarettes bring nicotine, tar, carbon monoxide, and other irritants into your body. Your lungs, heart, and blood vessels will be able to work better without these poisons. There are many different ways to quit smoking. Nicotine gum, nicotine patches, a nicotine inhaler, or nicotine nasal spray can help with physical craving. Hypnosis, support groups, and medicines help break the habit of smoking. Here are some tips to help you quit for good.  Throw away all cigarettes.  Clean and remove all ashtrays from your home, work, and car.  On a card, write down your reasons for quitting. Carry the card with you and read it when you get the urge to smoke.  Cleanse your body of nicotine. Drink enough water and fluids to keep your urine clear or pale yellow. Do this after quitting to flush the nicotine from your body.  Learn to predict your moods. Do not let a bad situation be your excuse to have a cigarette. Some situations in your life might tempt you into wanting a cigarette.  Never have "just one" cigarette. It leads to wanting another and another. Remind yourself of your decision to quit.  Change habits associated with smoking. If you smoked while driving or when feeling stressed, try other activities to replace smoking. Stand up when drinking your coffee. Brush your teeth after eating. Sit in a different chair when you read the paper. Avoid alcohol while trying to quit, and try to drink fewer caffeinated beverages. Alcohol and caffeine may urge you to smoke.  Avoid foods and drinks that can trigger a desire to smoke, such as sugary or spicy foods and alcohol.  Ask people who smoke not to smoke around you.  Have something planned to do right after eating or having a cup of coffee. Take a walk or exercise to perk you up. This will help to keep you  from overeating.  Try a relaxation exercise to calm you down and decrease your stress. Remember, you may be tense and nervous for the first 2 weeks after you quit, but this will pass.  Find new activities to keep your hands busy. Play with a pen, coin, or rubber band. Doodle or draw things on paper.  Brush your teeth right after eating. This will help cut down on the craving for the taste of tobacco after meals. You can try mouthwash, too.  Use oral substitutes, such as lemon drops, carrots, a cinnamon stick, or chewing gum, in place of cigarettes. Keep them handy so they are available when you have the urge to smoke.  When you have the urge to smoke, try deep breathing.  Designate your home as a nonsmoking area.  If you are a heavy smoker, ask your caregiver about a prescription for nicotine chewing gum. It can ease your withdrawal from nicotine.  Reward yourself. Set aside the cigarette money you save and buy yourself something nice.  Look for support from others. Join a support group or smoking cessation program. Ask someone at home or at work to help you with your plan to quit smoking.  Always ask yourself, "Do I need this cigarette or is this just a reflex?" Tell yourself, "Today, I choose not to smoke," or "I do not want to smoke." You are reminding yourself of your decision to quit, even if you do smoke a cigarette. HOW WILL I FEEL WHEN   I QUIT SMOKING?  The benefits of not smoking start within days of quitting.  You may have symptoms of withdrawal because your body is used to nicotine (the addictive substance in cigarettes). You may crave cigarettes, be irritable, feel very hungry, cough often, get headaches, or have difficulty concentrating.  The withdrawal symptoms are only temporary. They are strongest when you first quit but will go away within 10 to 14 days.  When withdrawal symptoms occur, stay in control. Think about your reasons for quitting. Remind yourself that these are  signs that your body is healing and getting used to being without cigarettes.  Remember that withdrawal symptoms are easier to treat than the major diseases that smoking can cause.  Even after the withdrawal is over, expect periodic urges to smoke. However, these cravings are generally short-lived and will go away whether you smoke or not. Do not smoke!  If you relapse and smoke again, do not lose hope. Most smokers quit 3 times before they are successful.  If you relapse, do not give up! Plan ahead and think about what you will do the next time you get the urge to smoke. LIFE AS A NONSMOKER: MAKE IT FOR A MONTH, MAKE IT FOR LIFE Day 1: Hang this page where you will see it every day. Day 2: Get rid of all ashtrays, matches, and lighters. Day 3: Drink water. Breathe deeply between sips. Day 4: Avoid places with smoke-filled air, such as bars, clubs, or the smoking section of restaurants. Day 5: Keep track of how much money you save by not smoking. Day 6: Avoid boredom. Keep a good book with you or go to the movies. Day 7: Reward yourself! One week without smoking! Day 8: Make a dental appointment to get your teeth cleaned. Day 9: Decide how you will turn down a cigarette before it is offered to you. Day 10: Review your reasons for quitting. Day 11: Distract yourself. Stay active to keep your mind off smoking and to relieve tension. Take a walk, exercise, read a book, do a crossword puzzle, or try a new hobby. Day 12: Exercise. Get off the bus before your stop or use stairs instead of escalators. Day 13: Call on friends for support and encouragement. Day 14: Reward yourself! Two weeks without smoking! Day 15: Practice deep breathing exercises. Day 16: Bet a friend that you can stay a nonsmoker. Day 17: Ask to sit in nonsmoking sections of restaurants. Day 18: Hang up "No Smoking" signs. Day 19: Think of yourself as a nonsmoker. Day 20: Each morning, tell yourself you will not smoke. Day  21: Reward yourself! Three weeks without smoking! Day 22: Think of smoking in negative ways. Remember how it stains your teeth, gives you bad breath, and leaves you short of breath. Day 23: Eat a nutritious breakfast. Day 24:Do not relive your days as a smoker. Day 25: Hold a pencil in your hand when talking on the telephone. Day 26: Tell all your friends you do not smoke. Day 27: Think about how much better food tastes. Day 28: Remember, one cigarette is one too many. Day 29: Take up a hobby that will keep your hands busy. Day 30: Congratulations! One month without smoking! Give yourself a big reward. Your caregiver can direct you to community resources or hospitals for support, which may include:  Group support.  Education.  Hypnosis.  Subliminal therapy. Document Released: 10/19/2003 Document Revised: 04/14/2011 Document Reviewed: 11/06/2008 [facility] Patient Information 2013 [place], [place]. Dejar  de fumar  (Smoking Cessation) Dejar de fumar es importante para su salud y tiene Brewing technologist. Sin embargo, no siempre es Public relations account executive de fumar ya que la nicotina es una droga [place]. Generalmente las personas intentan 3 veces o ms antes de poder dejar de fumar. En este documento se explican las mejores formas de prepararse para dejar de fumar. Esta decisin requiere [employer] y [address] esfuerzo, pero usted puede [place].  VENTAJAS DE DEJAR DE FUMAR   Vivir ms, se sentir mejor y vivir mejor.  El cuerpo sentir el impacto de dejar de fumar de inmediato.  Luego de 20 minutos la presin arterial disminuye. El pulso vuelve a su nivel normal.  Despus de 8 horas, los niveles de monxido de carbono en la sangre vuelven a la normalidad. Aumenta el nivel de oxgeno.  Despus de 24 horas, la probabilidad de infarto comienza a disminuir. La respiracin, el cabello y el cuerpo ya no huelen a humo.  Luego de 48 horas, los nervios daados comienzan a recuperarse. Mejoran el sentido  del gusto y Cabin crew.  Luego de 72 horas, el organismo est virtualmente libre de nicotina. Los conductos bronquiales se relajan, la respiracin se normaliza.  Despus de 2 a 12 semanas, los pulmones pueden contener ms aire. Se facilita la actividad fsica y mejora la respiracin.  El riesgo de sufrir un infarto, un ictus, cncer o enfermedad pulmonar disminuye en gran medida.  Despus de 1 ao, el riesgo de coronariopatas disminuye a la mitad.  Despus de 5 aos, el riesgo de ictus disminuye al nivel de un no fumador.  Despus de 10 aos, el riesgo de cncer de pulmn disminuye a la mitad, y el riesgo de sufrir otros tipos de cncer disminuye considerablemente.  Despus de 15 aos, el riesgo de enfermedad coronaria disminuye, generalmente al nivel de un no fumador.  Si est embarazada, al dejar de fumar aumentar las probabilidades de tener un beb sano.  Las personas con las que convive, [employer] nios, estarn ms saludables.  Tendr dinero extra para gastar en otras cosas que no sean cigarrillos. PREGUNTAS PARA PENSAR ANTES DE Monique Lyons DEJAR DE FUMAR  Quizs desee hablar acerca de sus preguntas con el mdico.   Por qu desea dejar de fumar?  Cuando trat de dejar de fumar en el pasado, qu lo ayud y qu no lo ayud?  Cules sern las situaciones ms difciles para usted despus de dejar de fumar? Cmo planea manejarlas?  Quin puede ayudarlo en los momentos difciles? Su familia? Sus amigos? Un profesional?  Qu placeres obtiene cuando fuma? De qu manera puede seguir obteniendo placer si abandona el hbito? Estas son algunas preguntas para hacrselas al profesional.   Cmo puede ayudarme a dejar de fumar con xito?  Qu medicamento cree que sera el mejor para m, y cmo debo tomarlo?  Qu debo hacer si necesito ms ayuda?  Cmo es la desintoxicacin del cigarrillo? Cmo puedo obtener informacin acerca de la desintoxicacin? Preprese    Establezca una fecha para dejar de fumar.  Cambie su entorno, deshacindose de los cigarrillos, ceniceros, fsforos y encendedores en su casa, el auto o el [place]. No permita que nadie fume dentro de su casa.  Repase sus intentos anteriores. Piense en qu cosas funcionaron y cules no. BUSQUE AYUDA Y ESTMULO  Usted tiene mejores probabilidades de tener xito si cuenta con ayuda. Puede obtener apoyo de [employer]:   Dgale a sus familiares, amigos y compaeros de trabajo que usted dejar  de fumar y que necesita su apoyo. Pdales que no fumen a su alrededor.  Obtenga consejo y apoyo individual, grupal o telefnico. Hay programas que se ofrecen en hospitales y centros mdicos locales. Comunquese con el departamento de salud de su localidad para obtener informacin acerca de los programas disponibles en su rea.  Las creencias y prcticas espirituales pueden ayudar a los fumadores a abandonar el hbito.  Descargue en su computadora un programa que registre sus estadsticas, por ejemplo, cunto hace que no fuma, la cantidad de cigarrillos que no ha fumado y el dinero ahorrado.  Consiga un libro de [place] sobre dejar de fumar y Manufacturing systems engineer del tabaco. Aprenda nuevas destrezas y conductas   Trate de entretenerse con otra cosa cuando sienta ganas de fumar. Hable con alguien, salga a caminar u ocpese en alguna tarea.  Cambie su rutina habitual. Blake Divine ruta diferente para llegar al Aleen Campi. Beba t en vez de caf. Desayune en un lugar diferente.  Reduzca las situaciones de estrs. Tome un bao caliente, practique alguna actividad fsica o lea un libro.  Planee hacer cada da algo que disfrute. Recompnsese por no fumar.  Explore programas interactivos en la web dedicados a ayudar a dejar de fumar. CONSIGA MEDICAMENTOS Y SELOS CORRECTAMENTE  Algunos medicamentos pueden ayudar a dejar de fumar y Technical sales engineer la necesidad de tabaco. Teacher, English as a foreign language los medicamentos con las conductas y mtodos  de apoyo ya mencionados puede aumentar en gran medida sus posibilidades de dejar de fumar con xito.   La terapia de reemplazo de nicotina enva nicotina al organismo sin los [place] y los riesgos del fumar. La terapia de reemplazo de nicotina incluye chicles, pastillas, inhaladores nasales en aerosol y parches para la piel de nicotina. Algunos son de Secundino Ginger y otros requieren una receta mdica.  Los antidepresivos ayudan a las personas a Animal nutritionist de Art therapist, Biomedical engineer no se conoce cul es el mecanismo. Se venden bajo receta mdica.  Los [employer] parciales de los receptores de nicotina simulan el efecto de la nicotina en el cerebro. Se venden bajo receta mdica. Pdale a su mdico que lo aconseje [employer] que debe Chemical engineer y cmo utilizarlos en base a su historia clnica. El mdico le dir qu efectos secundarios deber Warehouse manager en cuenta si decide utilizar un medicamento o seguir un tratamiento. Lea cuidadosamente la informacin en el envase. No utilice cualquier otro producto que contenga nicotina durante el uso de un producto de reemplazo de nicotina.  RECADA O SITUACIONES DIFCILES  La mayor parte de las recadas se producen dentro de los 3 primeros meses de abandonar el hbito. No  se desanime si comienza a fumar de nuevo. Recuerde, la [employer] tratan varias veces de dejar de fumar antes de lograrlo. Podr sufrir sndrome de abstinencia porque su cuerpo est acostumbrado a la nicotina. Podr sentir el deseo compulsivo de fumar, irritabilidad, enojo, tos, cefaleas y dificultad para concentrarse. Estos sntomas son transitorios. Son ms intensos en un comienzo, pero desaparecern en 10 a 14 das.  Para reducir las probabilidades de fracaso, trate de:   Evitar el consumo alcohol. El beber disminuye sus posibilidades de xito.  Disminuya el consumo de cafena. Una vez que deje de fumar, la cantidad de cafena en su organismo aumenta y puede darle sntomas, como  frecuencia cardaca rpida, sudoracin y ansiedad.  Evite a las [employer] fuman porque pueden hacer que usted desee [place].  No deje que el aumento de peso distraiga su objetivo. Muchos fumadores aumentarn de  peso cuando dejen de fumar, generalmente menos de 4,5 Kg. Consuma una dieta saludable y [place]. Siempre podr perder [employer] que se gane despus de dejar de fumar.  Encuentre formas de mejorar su estado de nimo que no sean fumando PARA OBTENER MS INFORMACIN  www.smokefree.gov  Document Released: 01/20/2005 Document Revised: 07/22/2011 [facility] Patient Information 2013 [place], [place]. Smoking Cessation Quitting smoking is important to your health and has many advantages. However, it is not always easy to quit since nicotine is a very addictive drug. Often times, people try 3 times or more before being able to quit. This document explains the best ways for you to prepare to quit smoking. Quitting takes hard work and a lot of effort, but you can do it. ADVANTAGES OF QUITTING SMOKING  You will live longer, feel better, and live better.  Your body will feel the impact of quitting smoking almost immediately.  Within 20 minutes, blood pressure decreases. Your pulse returns to its normal level.  After 8 hours, carbon monoxide levels in the blood return to normal. Your oxygen level increases.  After 24 hours, the chance of having a heart attack starts to decrease. Your breath, hair, and body stop smelling like smoke.  After 48 hours, damaged nerve endings begin to recover. Your sense of taste and smell improve.  After 72 hours, the body is virtually free of nicotine. Your bronchial tubes relax and breathing becomes easier.  After 2 to 12 weeks, lungs can hold more air. Exercise becomes easier and circulation improves.  The risk of having a heart attack, stroke, cancer, or lung disease is greatly reduced.  After 1 year, the risk of coronary heart disease is cut in  half.  After 5 years, the risk of stroke falls to the same as a nonsmoker.  After 10 years, the risk of lung cancer is cut in half and the risk of other cancers decreases significantly.  After 15 years, the risk of coronary heart disease drops, usually to the level of a nonsmoker.  If you are pregnant, quitting smoking will improve your chances of having a healthy baby.  The people you live with, especially any children, will be healthier.  You will have extra money to spend on things other than cigarettes. QUESTIONS TO THINK ABOUT BEFORE ATTEMPTING TO QUIT You may want to talk about your answers with your caregiver.  Why do you want to quit?  If you tried to quit in the past, what helped and what did not?  What will be the most difficult situations for you after you quit? How will you plan to handle them?  Who can help you through the tough times? Your family? Friends? A caregiver?  What pleasures do you get from smoking? What ways can you still get pleasure if you quit? Here are some questions to ask your caregiver:  How can you help me to be successful at quitting?  What medicine do you think would be best for me and how should I take it?  What should I do if I need more help?  What is smoking withdrawal like? How can I get information on withdrawal? GET READY  Set a quit date.  Change your environment by getting rid of all cigarettes, ashtrays, matches, and lighters in your home, car, or work. Do not let people smoke in your home.  Review your past attempts to quit. Think about what worked and what did not. GET SUPPORT AND ENCOURAGEMENT You have a better chance  of being successful if you have help. You can get support in many ways.  Tell your family, friends, and co-workers that you are going to quit and need their support. Ask them not to smoke around you.  Get individual, group, or telephone counseling and support. Programs are available at [employer] and  health centers. Call your local health department for information about programs in your area.  Spiritual beliefs and practices may help some smokers quit.  Download a "quit meter" on your computer to keep track of quit statistics, such as how long you have gone without smoking, cigarettes not smoked, and money saved.  Get a self-help book about quitting smoking and staying off of tobacco. LEARN NEW SKILLS AND BEHAVIORS  Distract yourself from urges to smoke. Talk to someone, go for a walk, or occupy your time with a task.  Change your normal routine. Take a different route to work. Drink tea instead of coffee. Eat breakfast in a different place.  Reduce your stress. Take a hot bath, exercise, or read a book.  Plan something enjoyable to do every day. Reward yourself for not smoking.  Explore interactive web-based programs that specialize in helping you quit. GET MEDICINE AND USE IT CORRECTLY Medicines can help you stop smoking and decrease the urge to smoke. Combining medicine with the above behavioral methods and support can greatly increase your chances of successfully quitting smoking.  Nicotine replacement therapy helps deliver nicotine to your body without the negative effects and risks of smoking. Nicotine replacement therapy includes nicotine gum, lozenges, inhalers, nasal sprays, and skin patches. Some may be available over-the-counter and others require a prescription.  Antidepressant medicine helps people abstain from smoking, but how this works is unknown. This medicine is available by prescription.  Nicotinic receptor partial agonist medicine simulates the effect of nicotine in your brain. This medicine is available by prescription. Ask your caregiver for advice about which medicines to use and how to use them based on your health history. Your caregiver will tell you what side effects to look out for if you choose to be on a medicine or therapy. Carefully read the information  on the package. Do not use any other product containing nicotine while using a nicotine replacement product.  RELAPSE OR DIFFICULT SITUATIONS Most relapses occur within the first 3 months after quitting. Do not be discouraged if you start smoking again. Remember, most people try several times before finally quitting. You may have symptoms of withdrawal because your body is used to nicotine. You may crave cigarettes, be irritable, feel very hungry, cough often, get headaches, or have difficulty concentrating. The withdrawal symptoms are only temporary. They are strongest when you first quit, but they will go away within 10 14 days. To reduce the chances of relapse, try to:  Avoid drinking alcohol. Drinking lowers your chances of successfully quitting.  Reduce the amount of caffeine you consume. Once you quit smoking, the amount of caffeine in your body increases and can give you symptoms, such as a rapid heartbeat, sweating, and anxiety.  Avoid smokers because they can make you want to smoke.  Do not let weight gain distract you. Many smokers will gain weight when they quit, usually less than 10 pounds. Eat a healthy diet and stay active. You can always lose the weight gained after you quit.  Find ways to improve your mood other than smoking. FOR MORE INFORMATION  www.smokefree.gov  Document Released: 01/14/2001 Document Revised: 07/22/2011 Document Reviewed: 05/01/2011 [employer] Patient  Information 2013 [place], [place]. Secondhand Smoke Secondhand smoke is the smoke exhaled by smokersand the smoke given off by a burning cigarette, cigar, or pipe. When a cigarette is smoked, about half of the smoke is inhaled and exhaled by the smoker, and the other half floats around in the air. Exposure to secondhand smoke is also called involuntary smoking or passive smoking. People can be exposed to secondhand smoke in:   Homes.  Cars.  Workplaces.  Public places (bars, restaurants, other recreation  sites). Exposure to secondhand smoke is hazardous.It contains more than 250 harmful chemicals, including at least 60 that can cause cancer. These chemicals include:  Arsenic, a heavy metal toxin.  Benzene, a chemical found in gasoline.  Beryllium, a toxic metal.  Cadmium, a metal used in batteries.  Chromium, a metallic element.  Ethylene oxide, a chemical used to sterilize medical devices.  Nickel, a metallic element.  Polonium 210, a chemical element that gives off radiation.  Vinyl chloride, a toxic substance used in the Building control surveyor. Nonsmoking spouses and family members of smokers have higher rates of cancer, heart disease, and serious respiratory illnesses than those not exposed to secondhand smoke.  Nicotine, a nicotine by-product called cotinine, carbon monoxide, and other evidence of secondhand smoke exposure have been found in the body fluids of nonsmokers exposed to secondhand smoke.  Living with a smoker may increase a nonsmoker's chances of developing lung cancer by 20 to 30 percent.  Secondhand smoke may increase the risk of breast cancer, nasal sinus cavity cancer, cervical cancer, bladder cancer, and nose and throat (nasopharyngeal)cancer in adults.  Secondhand smoke may increase the risk of heart disease by 25 to 30 percent. Children are especially at risk from secondhand smoke exposure. Children of smokers have higher rates of:  Pneumonia.  Asthma.  Smoking.  Bronchitis.  Colds.  Chronic cough.  Ear infections.  Tonsilitis.  School absences. Research suggests that exposure to secondhand smoke may cause leukemia, lymphoma, and brain tumors in children. Babies are three times more likely to die from sudden infant death syndrome (SIDS) if their mothers smoked during and after pregnancy. There is no safe level of exposure to secondhand smoke. Studies have shown that even low levels of exposure can be harmful. The only way to fully protect  nonsmokers from secondhand smoke exposure is to completely eliminate smoking in indoor spaces. The best thing you can do for your own health and for your children's health is to stop smoking. You should stop as soon as possible. This is not easy, and you may fail several times at quitting before you get free of this addiction. Nicotine replacement therapy ( such as patches, gum, or lozenges) can help. These therapies can help you deal with the physical symptoms of withdrawal. Attending quit-smoking support groups can help you deal with the emotional issues of quitting smoking.  Even if you are not ready to quit right now, there are some simple changes you can make to reduce the effect of your smoking on your family:  Do not smoke in your home. Smoke away from your home in an open area, preferably outside.  Ask others to not smoke in your home.  Do not smoke while holding a child or when children are near.  Do not smoke in your car.  Avoid restaurants, day care centers, and other places that allow smoking. Document Released: 02/28/2004 Document Revised: 04/14/2011 Document Reviewed: 11/01/2008 [facility] Patient Information 2013 [place], [place]. Smokeless Tobacco Use Smokeless tobacco is  a loose, fine, or stringy tobacco. The tobacco is not smoked like a cigarette, but it is chewed or held in the lips or cheeks. It resembles tea and comes from the leaves of the tobacco plant. Smokeless tobacco is usually flavored, sweetened, or processed in some way. Although smokeless tobacco is not smoked into the lungs, its chemicals are absorbed through the membranes in the mouth and into the bloodstream. Its chemicals are also swallowed in saliva. The chemicals (nicotine and other toxins) are known to cause cancer. Smokeless tobacco contains up to 28 differentcarcinogens. CAUSES Nicotine is addictive. Smokeless tobacco contains nicotine, which is a stimulant. This stimulant can give you a "buzz" or altered state.  People can become addicted to the feeling it delivers.  SYMPTOMS Smokeless tobacco can cause health problems, including:  Bad breath.  Yellow-brown teeth.  Mouth sores.  Cracking and bleeding lips.  Gum disease, gum recession, and bone loss around the teeth.  Tooth decay.  Increased or irregular heart rate.  High blood pressure, heart disease, and stroke.  Cancer of the mouth, lips, tongue, pancreas, voice box (larynx), esophagus, colon, and bladder.  Precancerous lesion of the soft tissues of the mouth (leukoplakia).  Loss of your sense of taste. TREATMENT Talk with your caregiver about ways you can quit. Quitting tobacco is a good decision for your health. Nicotine is addictive, but several options are available to help you quit including:  Nicotine replacement therapy (gum or patch).  Support and cessation programs. The following tips can help you quit:  Write down the reasons you would like to quit and look at them often.  Set a date during a low stress time to stop or cut back.  Ask family and friends for their support.  Remove all tobacco products from your home and work.  Replace the chewing tobacco with things like beef jerky, sunflower seeds, or shredded coconut.  Avoid situations that may make you want to chew tobacco.  Exercise and eat a healthy diet.  When you crave tobacco, distract yourself with drinking water, sugarless chewing gum, sugarless hard candy, exercising, or deep breathing. HOME CARE INSTRUCTIONS  See your dentist for regular oral health exams every 6 months.  Follow up with your caregiver as recommended. SEEK MEDICAL CARE OR DENTAL CARE IF:  You have bleeding or cracking lips, gums, or cheeks.  You have mouth sores, discolorations, or pain.  You have tooth pain.  You develop persistent irritation, burning, or sores in the mouth.  You have pain, tenderness, or numbness in the mouth.  You develop a lump, bumpy patch, or  hardened skin inside the mouth.  The color changes inside your mouth (gray, white, or red spots).  You have difficulty chewing, swallowing, or speaking. Document Released: 06/24/2010 Document Revised: 04/14/2011 Document Reviewed: 06/24/2010 [facility] Patient Information 2013 [place], [place].

## 2012-06-01 NOTE — Progress Notes (Signed)
Patient c/o shortness of breath, dizziness, and leg/foot pain.  She reports swelling/tingling/numbness in her feet.  Some burning upon urination as well as urinary frequency but UA does not reveal UTI.  Patient states "I feel like after my last child my urethra has dropped or moved."  Reports painful intercourse.

## 2012-06-03 LAB — CULTURE, OB URINE: Colony Count: 10000

## 2012-06-03 NOTE — Progress Notes (Signed)
Subjective:    Monique Lyons is being seen today for her first obstetrical visit.  This is not a planned pregnancy. She is at [redacted]w[redacted]d gestation. Her obstetrical history is significant for prev c-section x1 with successful VBAC x2. Relationship with FOB: spouse, living together. Patient does intend to breast feed. Pregnancy history fully reviewed.  Menstrual History: OB History   Grav Para Term Preterm Abortions TAB SAB Ect Mult Living   5 3 3  1     3        Patient's last menstrual period was 08/01/2011.    The following portions of the patient's history were reviewed and updated as appropriate: allergies, current medications, past family history, past medical history, past social history, past surgical history and problem list.  Review of Systems Pertinent items are noted in HPI.    Objective:    Ht 5' 8.5" (1.74 m)  LMP 08/01/2011 Ht 5' 8.5" (1.74 m)  LMP 08/01/2011   General Appearance:    Alert, cooperative, no distress, appears stated age  Head:    Normocephalic, without obvious abnormality, atraumatic           Throat:   Lips, mucosa, and tongue normal; teeth and gums normal  Neck:   Supple, symmetrical, trachea midline, no adenopathy;    thyroid:  no enlargement/tenderness/nodules; no carotid   bruit or JVD  Back:     Symmetric, no curvature, ROM normal, no CVA tenderness  Lungs:     Clear to auscultation bilaterally, respirations unlabored  Chest Wall:    No tenderness or deformity   Heart:    Regular rate and rhythm, S1 and S2 normal, no murmur, rub   or gallop  Breast Exam:    No tenderness, masses, or nipple abnormality  Abdomen:     Soft, non-tender, bowel sounds active all four quadrants,    no masses, no organomegaly  Genitalia:    Normal female without lesion, discharge or tenderness     Extremities:   Extremities normal, atraumatic, no cyanosis or edema  Pulses:   2+ and symmetric all extremities  Skin:   Skin color, texture, turgor normal, no rashes or  lesions            Assessment:    Pregnancy at 20 and 6/7 weeks  Prev c-section desires TOLAC   Plan:    Initial labs drawn. Prenatal vitamins. Problem list reviewed and updated. AFP3 discussed: requested. Role of ultrasound in pregnancy discussed; fetal survey: ordered. Amniocentesis discussed: not indicated. Follow up in 4 weeks. F/u PAP and cx

## 2012-06-09 ENCOUNTER — Encounter: Payer: Self-pay | Admitting: *Deleted

## 2012-06-09 ENCOUNTER — Encounter: Payer: Self-pay | Admitting: Obstetrics & Gynecology

## 2012-06-29 ENCOUNTER — Encounter: Payer: Self-pay | Admitting: Obstetrics & Gynecology

## 2012-06-29 ENCOUNTER — Ambulatory Visit (INDEPENDENT_AMBULATORY_CARE_PROVIDER_SITE_OTHER): Payer: Medicaid Other | Admitting: Obstetrics & Gynecology

## 2012-06-29 VITALS — BP 114/73 | Wt 167.4 lb

## 2012-06-29 DIAGNOSIS — Z3482 Encounter for supervision of other normal pregnancy, second trimester: Secondary | ICD-10-CM

## 2012-06-29 DIAGNOSIS — O0932 Supervision of pregnancy with insufficient antenatal care, second trimester: Secondary | ICD-10-CM

## 2012-06-29 DIAGNOSIS — O0930 Supervision of pregnancy with insufficient antenatal care, unspecified trimester: Secondary | ICD-10-CM

## 2012-06-29 LAB — POCT URINALYSIS DIP (DEVICE)
Ketones, ur: NEGATIVE mg/dL
Protein, ur: 30 mg/dL — AB
pH: 7 (ref 5.0–8.0)

## 2012-06-29 NOTE — Progress Notes (Signed)
Pulse- 98 

## 2012-06-29 NOTE — Patient Instructions (Addendum)
Vaginal Birth After Cesarean Delivery Vaginal birth after Cesarean delivery (VBAC) is giving birth vaginally after previously delivering a baby by a cesarean. In the past, if a woman had a Cesarean delivery, all births afterwards would be done by Cesarean delivery. This is no longer true. It can be safe for the mother to try a vaginal delivery after having a Cesarean. The final decision to have a VBAC or repeat Cesarean delivery should be between the patient and her caregiver. The risks and benefits can be discussed relative to the reason for, and the type of the previous Cesarean delivery. WOMEN WHO PLAN TO HAVE A VBAC SHOULD CHECK WITH THEIR DOCTOR TO BE SURE THAT:  The previous Cesarean was done with a low transverse uterine incision (not a vertical classical incision).  The birth canal is big enough for the baby.  There were no other operations on the uterus.  They will have an electronic fetal monitor (EFM) on at all times during labor.  An operating room would be available and ready in case an emergency Cesarean is needed.  A doctor and surgical nursing staff would be available at all times during labor to be ready to do an emergency Cesarean if necessary.  An anesthesiologist would be present in case an emergency Cesarean is needed.  The nursery is prepared and has adequate personnel and necessary equipment available to care for the baby in case of an emergency Cesarean. BENEFITS OF VBAC:  Shorter stay in the hospital.  Lower delivery, nursery and hospital costs.  Less blood loss and need for blood transfusions.  Less fever and discomfort from major surgery.  Lower risk of blood clots.  Lower risk of infection.  Shorter recovery after going home.  Lower risk of other surgical complications, such as opening of the incision or hernia in the incision.  Decreased risk of injury to other organs.  Decreased risk for having to remove the uterus (hysterectomy).  Decreased risk  for the placenta to completely or partially cover the opening of the uterus (placenta previa) with a future pregnancy.  Ability to have a larger family if desired. RISKS OF A VBAC:  Rupture of the uterus.  Having to remove the uterus (hysterectomy) if it ruptures.  All the complications of major surgery and/or injury to other organs.  Excessive bleeding, blood clots and infection.  Lower Apgar scores (method to evaluate the newborn based on appearance, pulse, grimace, activity, and respiration) and more risks to the baby.  There is a higher risk of uterine rupture if you induce or augment labor.  There is a higher risk of uterine rupture if you use medications to ripen the cervix. VBAC SHOULD NOT BE DONE IF:  The previous Cesarean was done with a vertical (classical) or T-shaped incision, or you do not know what kind of an incision was made.  You had a ruptured uterus.  You had surgery on your uterus.  You have medical or obstetrical problems.  There are problems with the baby.  There were two previous Cesarean deliveries and no vaginal deliveries. OTHER FACTS TO KNOW ABOUT VBAC:  It is safe to have an epidural anesthetic with VBAC.  It is safe to turn the baby from a breech position (attempt an external cephalic version).  It is safe to try a VBAC with twins.  Pregnancies later than 40 weeks have not been successful with VBAC.  There is an increased failure rate of a VBAC in obese pregnant women.  There is  an increased failure rate with VABC if the baby weighs 8.8 pounds (4000 grams) or more.  There is an increased failure rate if the time between the Cesarean and VBAC is less than 19 months.  There is an increased failure rate if pre-eclampsia is present (high blood pressure, protein in the urine and swelling of face and extremities).  VBAC is very successful if there was a previous vaginal birth.  VBAC is very successful when the labor starts spontaneously before  the due date.  Delivery of VBAC is similar to having a normal spontaneous vaginal delivery. It is important to discuss VBAC with your caregiver early in the pregnancy so you can understand the risks, benefits and options. It will give you time to decide what is best in your particular case relevant to the reason for your previous Cesarean delivery. It should be understood that medical changes in the mother or pregnancy may occur during the pregnancy, which make it necessary to change you or your caregiver's initial decision. The counseling, concerns and decisions should be documented in the medical record and signed by all parties. Document Released: 07/13/2006 Document Revised: 04/14/2011 Document Reviewed: 03/03/2008 [facility] Patient Information 2014 [place], [place]. Postpartum Tubal Ligation A postpartum tubal ligation (PPTL) is a procedure that blocks the fallopian tubes right after childbirth or 1 2 days after childbirth. PPTL is done before the uterus returns to its normal location. The procedure is also called a minilaparotomy. By blocking the fallopian tubes, the eggs that are released from the ovaries cannot enter the uterus and sperm cannot reach the egg. A PPTL is done so you will not be able to get pregnant or have a baby.  Although this procedure may be reversed, it should be considered permanent and irreversible. If you want to have future pregnancies, you should not have this procedure. LET YOUR CAREGIVER KNOW ABOUT:  Allergies to food or medicine.  Medicines taken, including vitamins, herbs, eyedrops, over-the-counter medicines, and creams.  Use of steroids (by mouth or creams).  Previous problems with numbing medicines.  History of bleeding problems or blood clots.  Any recent colds or infections.  Previous surgery.  Other health problems, including diabetes and kidney problems. RISKS AND COMPLICATIONS  Infection.  Bleeding.  Injury to other organs.  Anesthetic side  effects.  Failure of the procedure.  Ectopic pregnancy.  Future regret about having the procedure done. BEFORE THE PROCEDURE   You may need to sign certain permission forms with your insurance up to 30 days before your due date.  After delivering your baby, you cannot eat or drink anything if the procedure is performed the same day. If you are having the procedure a day after delivering, you may be able to eat and drink until midnight. Your caregiver will give you specific directions depending on your situation. PROCEDURE   If done 1 2 days after delivery:  You will be given a medicine to make you sleep (general anesthetic) during the procedure.  A tube will be put down your throat to help your breath while under general anesthesia.  A small cut (incision) is made just beneath the belly button.  The fallopian tubes are brought up through the incision.  The fallopian tubes are then sealed, tied, or cut.  If done after a caesarean delivery:  Tubal ligation is done through the incision already made (after the baby is delivered).  Once the tubes are blocked, the incision is closed with stitches (sutures). A bandage will be placed  over the incisions. AFTER THE PROCEDURE  You may have some pain or cramps in the abdominal area for the next 3 7 days.  You will be given pain medicine to ease any discomfort.  You may also feel sick to your stomach if you were given a general anesthetic.  You may have some mild discomfort in the throat. This is from the tube that may have been placed in your throat while you were sleeping.  You may feel tired and should rest the remainder of the day. Document Released: 01/20/2005 Document Revised: 07/22/2011 Document Reviewed: 05/03/2011 [facility] Patient Information 2014 [place], [place].

## 2012-06-29 NOTE — Progress Notes (Signed)
No problems  Desires sterilization- sign Title XIX papers at next visit 1 hr GTT at next visit

## 2012-07-28 ENCOUNTER — Encounter: Payer: Self-pay | Admitting: Advanced Practice Midwife

## 2012-07-28 ENCOUNTER — Ambulatory Visit (INDEPENDENT_AMBULATORY_CARE_PROVIDER_SITE_OTHER): Payer: Medicaid Other | Admitting: Advanced Practice Midwife

## 2012-07-28 VITALS — BP 97/63 | Temp 98.8°F | Wt 166.0 lb

## 2012-07-28 DIAGNOSIS — O4400 Placenta previa specified as without hemorrhage, unspecified trimester: Secondary | ICD-10-CM

## 2012-07-28 DIAGNOSIS — O358XX1 Maternal care for other (suspected) fetal abnormality and damage, fetus 1: Secondary | ICD-10-CM

## 2012-07-28 DIAGNOSIS — Z3482 Encounter for supervision of other normal pregnancy, second trimester: Secondary | ICD-10-CM

## 2012-07-28 DIAGNOSIS — O4403 Placenta previa specified as without hemorrhage, third trimester: Secondary | ICD-10-CM

## 2012-07-28 DIAGNOSIS — O358XX0 Maternal care for other (suspected) fetal abnormality and damage, not applicable or unspecified: Secondary | ICD-10-CM

## 2012-07-28 NOTE — Addendum Note (Signed)
Addended by: Franchot Mimes on: 07/28/2012 12:38 PM   Modules accepted: Orders

## 2012-07-28 NOTE — Progress Notes (Signed)
Pulse: 81

## 2012-07-28 NOTE — Patient Instructions (Addendum)
Placenta Previa Placenta previa is a condition in which the placenta has grown low in the womb (uterus). This is a condition in which the organ which connects the fetus to the mother's uterus (placenta) is low in the opening in the uterus (cervix). It can partially or completely cover the cervix. The cause of this is unknown. It is more common with multiple births or twins. SYMPTOMS  The main symptom or sign of placenta previa is vaginal bleeding. The bleeding can be mild to very heavy. This condition can be very serious for the mother and baby. Often there are no symptoms with placenta previa. Sometimes if the location of the placenta is very low it will become partially detached and cause bleeding. This may be simply a marginal sinus separation of the placenta. This is a separation of the vessels from the wall of the uterus. This may cause no further problems other than mild anxiety. There is an increase risk of intrauterine growth restriction (IUGR) with placenta previa because of the abnormal placement of the placenta. DIAGNOSIS  The diagnosis is usually made by ultrasound exam of the uterus. There may be a careful vaginal exam to see the cervix. The patient will be prepared for a Cesarean section immediately if necessary. TREATMENT  Treatment for placenta previa is usually bed rest in the hospital or at home. You may be given medication to stop contractions. Contractions can increase bleeding. Your doctor may take fluid from the baby's sac (amniocentesis) to see if the baby's lungs are mature enough for a C-section. A blood transfusion may be necessary if you have a low blood count. No further treatment may be needed when placenta previa is present in small degrees. Early placenta previa may resolve on it's own. The placenta moves higher in the birth canal as pregnancy progresses. In this case the placenta no longer is an obstruction to birth. The position of the placenta may need to be reconfirmed  during pregnancy. This can be done with an ultrasound exam of the belly(abdomen). Call your caregiver immediately if blood loss is severe. Immediate fluid or blood replacement may be necessary. With complete placenta previa, the only way to safely deliver the baby is by Cesarean section. HOME CARE INSTRUCTIONS   Follow your caregiver's advice about bed rest.  Take any iron pills or other medications your doctor gives to you.  No bending or lifting.  Do not have sexual intercourse.  Do not put anything in your vagina (tampons or vaginal creams). If you are bleeding, use sanitary pads.  Keep your doctors appointments as scheduled. Not keeping the appointment could result in a chronic or permanent injury, pain, disability and injury or death to you or your unborn baby. If there is any problem keeping the appointment, you must call back to this facility for assistance. SEEK IMMEDIATE MEDICAL CARE IF:   You have increased bleeding.  You have fainting episodes or feel lightheaded.  You develop abdominal pain.  You can no longer feel normal fetal or baby movements.  You develop uterine contractions. Document Released: 01/20/2005 Document Revised: 04/14/2011 Document Reviewed: 09/03/2007 [employer] Patient Information 2014 [employer].  

## 2012-07-28 NOTE — Progress Notes (Signed)
Reviewed lab results and pap results.   Reviewed US findings from April which showed marginal previa and echogenic focus of LV.  Pt states no one has told her of these results. Was upset. Reassured that the echogenic focus in isolation rarely means a problem.  Has had no bleeding. Bleeding precautions reviewed. Suspect placenta has probably moved by now. Will schedule [place] for this week.

## 2012-07-29 ENCOUNTER — Ambulatory Visit (HOSPITAL_COMMUNITY): Payer: Medicaid Other

## 2012-07-29 ENCOUNTER — Telehealth: Payer: Self-pay | Admitting: General Practice

## 2012-07-29 LAB — CBC
Hemoglobin: 11.7 g/dL — ABNORMAL LOW (ref 12.0–15.0)
MCH: 29.9 pg (ref 26.0–34.0)
MCV: 88.2 fL (ref 78.0–100.0)
RBC: 3.91 MIL/uL (ref 3.87–5.11)

## 2012-07-29 NOTE — Telephone Encounter (Signed)
Patient called and left message stating she was supposed to have an ultrasound scheduled this week due to complications with pregnancy. Reviewed patient's chart did have ultrasound appt today 6/26 @ 11:30. Called patient, no answer- left message that we are returning her phone call and to call us back.

## 2012-07-30 ENCOUNTER — Other Ambulatory Visit: Payer: Self-pay | Admitting: Advanced Practice Midwife

## 2012-07-30 ENCOUNTER — Ambulatory Visit (HOSPITAL_COMMUNITY)
Admission: RE | Admit: 2012-07-30 | Discharge: 2012-07-30 | Disposition: A | Discharge status: Home / Self Care | Payer: Medicaid Other | Source: Ambulatory Visit | Attending: Advanced Practice Midwife | Admitting: Advanced Practice Midwife

## 2012-07-30 DIAGNOSIS — O4400 Placenta previa specified as without hemorrhage, unspecified trimester: Secondary | ICD-10-CM | POA: Insufficient documentation

## 2012-07-30 DIAGNOSIS — O358XX1 Maternal care for other (suspected) fetal abnormality and damage, fetus 1: Secondary | ICD-10-CM

## 2012-07-30 DIAGNOSIS — O358XX0 Maternal care for other (suspected) fetal abnormality and damage, not applicable or unspecified: Secondary | ICD-10-CM | POA: Insufficient documentation

## 2012-07-30 NOTE — Telephone Encounter (Signed)
Patient is rescheduled for today.

## 2012-08-02 ENCOUNTER — Encounter: Payer: Self-pay | Admitting: Advanced Practice Midwife

## 2012-08-02 DIAGNOSIS — O358XX0 Maternal care for other (suspected) fetal abnormality and damage, not applicable or unspecified: Secondary | ICD-10-CM | POA: Insufficient documentation

## 2012-08-02 DIAGNOSIS — O4402 Placenta previa specified as without hemorrhage, second trimester: Secondary | ICD-10-CM | POA: Insufficient documentation

## 2012-08-04 ENCOUNTER — Encounter: Payer: Self-pay | Admitting: *Deleted

## 2012-08-25 ENCOUNTER — Ambulatory Visit (INDEPENDENT_AMBULATORY_CARE_PROVIDER_SITE_OTHER): Payer: Medicaid Other | Admitting: Obstetrics and Gynecology

## 2012-08-25 ENCOUNTER — Encounter: Payer: Self-pay | Admitting: Obstetrics and Gynecology

## 2012-08-25 VITALS — BP 117/76 | Temp 98.9°F | Wt 168.9 lb

## 2012-08-25 DIAGNOSIS — O0930 Supervision of pregnancy with insufficient antenatal care, unspecified trimester: Secondary | ICD-10-CM

## 2012-08-25 LAB — POCT URINALYSIS DIP (DEVICE)
Ketones, ur: NEGATIVE mg/dL
Protein, ur: NEGATIVE mg/dL
Specific Gravity, Urine: >=1.03 (ref 1.005–1.030)
pH: 6.5 (ref 5.0–8.0)

## 2012-08-25 NOTE — Patient Instructions (Signed)
Pregnancy - Third Trimester The third trimester of pregnancy (the last 3 months) is a period of the most rapid growth for you and your baby. The baby approaches a length of 20 inches and a weight of 6 to 10 pounds. The baby is adding on fat and getting ready for life outside your body. While inside, babies have periods of sleeping and waking, sucking thumbs, and hiccuping. You can often feel small contractions of the uterus. This is false labor. It is also called Braxton-Hicks contractions. This is like a practice for labor. The usual problems in this stage of pregnancy include more difficulty breathing, swelling of the hands and feet from water retention, and having to urinate more often because of the uterus and baby pressing on your bladder.  PRENATAL EXAMS  Blood work may continue to be done during prenatal exams. These tests are done to check on your health and the probable health of your baby. Blood work is used to follow your blood levels (hemoglobin). Anemia (low hemoglobin) is common during pregnancy. Iron and vitamins are given to help prevent this. You may also continue to be checked for diabetes. Some of the past blood tests may be done again.  The size of the uterus is measured during each visit. This makes sure your baby is growing properly according to your pregnancy dates.  Your blood pressure is checked every prenatal visit. This is to make sure you are not getting toxemia.  Your urine is checked every prenatal visit for infection, diabetes, and protein.  Your weight is checked at each visit. This is done to make sure gains are happening at the suggested rate and that you and your baby are growing normally.  Sometimes, an ultrasound is performed to confirm the position and the proper growth and development of the baby. This is a test done that bounces harmless sound waves off the baby so your caregiver can more accurately determine a due date.  Discuss the type of pain medicine and  anesthesia you will have during your labor and delivery.  Discuss the possibility and anesthesia if a cesarean section might be necessary.  Inform your caregiver if there is any mental or physical violence at home. Sometimes, a specialized non-stress test, contraction stress test, and biophysical profile are done to make sure the baby is not having a problem. Checking the amniotic fluid surrounding the baby is called an amniocentesis. The amniotic fluid is removed by sticking a needle into the belly (abdomen). This is sometimes done near the end of pregnancy if an early delivery is required. In this case, it is done to help make sure the baby's lungs are mature enough for the baby to live outside of the womb. If the lungs are not mature and it is unsafe to deliver the baby, an injection of cortisone medicine is given to the mother 1 to 2 days before the delivery. This helps the baby's lungs mature and makes it safer to deliver the baby. CHANGES OCCURING IN THE THIRD TRIMESTER OF PREGNANCY Your body goes through many changes during pregnancy. They vary from person to person. Talk to your caregiver about changes you notice and are concerned about.  During the last trimester, you have probably had an increase in your appetite. It is normal to have cravings for certain foods. This varies from person to person and pregnancy to pregnancy.  You may begin to get stretch marks on your hips, abdomen, and breasts. These are normal changes in the body   during pregnancy. There are no exercises or medicines to take which prevent this change.  Constipation may be treated with a stool softener or adding bulk to your diet. Drinking lots of fluids, fiber in vegetables, fruits, and whole grains are helpful.  Exercising is also helpful. If you have been very active up until your pregnancy, most of these activities can be continued during your pregnancy. If you have been less active, it is helpful to start an exercise  program such as walking. Consult your caregiver before starting exercise programs.  Avoid all smoking, alcohol, non-prescribed drugs, herbs and "street drugs" during your pregnancy. These chemicals affect the formation and growth of the baby. Avoid chemicals throughout the pregnancy to ensure the delivery of a healthy infant.  Backache, varicose veins, and hemorrhoids may develop or get worse.  You will tire more easily in the third trimester, which is normal.  The baby's movements may be stronger and more often.  You may become short of breath easily.  Your belly button may stick out.  A yellow discharge may leak from your breasts called colostrum.  You may have a bloody mucus discharge. This usually occurs a few days to a week before labor begins. HOME CARE INSTRUCTIONS   Keep your caregiver's appointments. Follow your caregiver's instructions regarding medicine use, exercise, and diet.  During pregnancy, you are providing food for you and your baby. Continue to eat regular, well-balanced meals. Choose foods such as meat, fish, milk and other low fat dairy products, vegetables, fruits, and whole-grain breads and cereals. Your caregiver will tell you of the ideal weight gain.  A physical sexual relationship may be continued throughout pregnancy if there are no other problems such as early (premature) leaking of amniotic fluid from the membranes, vaginal bleeding, or belly (abdominal) pain.  Exercise regularly if there are no restrictions. Check with your caregiver if you are unsure of the safety of your exercises. Greater weight gain will occur in the last 2 trimesters of pregnancy. Exercising helps:  Control your weight.  Get you in shape for labor and delivery.  You lose weight after you deliver.  Rest a lot with legs elevated, or as needed for leg cramps or low back pain.  Wear a good support or jogging bra for breast tenderness during pregnancy. This may help if worn during  sleep. Pads or tissues may be used in the bra if you are leaking colostrum.  Do not use hot tubs, steam rooms, or saunas.  Wear your seat belt when driving. This protects you and your baby if you are in an accident.  Avoid raw meat, cat litter boxes and soil used by cats. These carry germs that can cause birth defects in the baby.  It is easier to leak urine during pregnancy. Tightening up and strengthening the pelvic muscles will help with this problem. You can practice stopping your urination while you are going to the bathroom. These are the same muscles you need to strengthen. It is also the muscles you would use if you were trying to stop from passing gas. You can practice tightening these muscles up 10 times a set and repeating this about 3 times per day. Once you know what muscles to tighten up, do not perform these exercises during urination. It is more likely to cause an infection by backing up the urine.  Ask for help if you have financial, counseling, or nutritional needs during pregnancy. Your caregiver will be able to offer counseling for these   needs as well as refer you for other special needs.  Make a list of emergency phone numbers and have them available.  Plan on getting help from family or friends when you go home from the hospital.  Make a trial run to the hospital.  Take prenatal classes with the father to understand, practice, and ask questions about the labor and delivery.  Prepare the baby's room or nursery.  Do not travel out of the city unless it is absolutely necessary and with the advice of your caregiver.  Wear only low or no heal shoes to have better balance and prevent falling. MEDICINES AND DRUG USE IN PREGNANCY  Take prenatal vitamins as directed. The vitamin should contain 1 milligram of folic acid. Keep all vitamins out of reach of children. Only a couple vitamins or tablets containing iron may be fatal to a baby or young child when ingested.  Avoid use  of all medicines, including herbs, over-the-counter medicines, not prescribed or suggested by your caregiver. Only take over-the-counter or prescription medicines for pain, discomfort, or fever as directed by your caregiver. Do not use aspirin, ibuprofen or naproxen unless approved by your caregiver.  Let your caregiver also know about herbs you may be using.  Alcohol is related to a number of birth defects. This includes fetal alcohol syndrome. All alcohol, in any form, should be avoided completely. Smoking will cause low birth rate and premature babies.  Illegal drugs are very harmful to the baby. They are absolutely forbidden. A baby born to an addicted mother will be addicted at birth. The baby will go through the same withdrawal an adult does. SEEK MEDICAL CARE IF: You have any concerns or worries during your pregnancy. It is better to call with your questions if you feel they cannot wait, rather than worry about them. SEEK IMMEDIATE MEDICAL CARE IF:   An unexplained oral temperature above 102 F (38.9 C) develops, or as your caregiver suggests.  You have leaking of fluid from the vagina. If leaking membranes are suspected, take your temperature and tell your caregiver of this when you call.  There is vaginal spotting, bleeding or passing clots. Tell your caregiver of the amount and how many pads are used.  You develop a bad smelling vaginal discharge with a change in the color from clear to white.  You develop vomiting that lasts more than 24 hours.  You develop chills or fever.  You develop shortness of breath.  You develop burning on urination.  You loose more than 2 pounds of weight or gain more than 2 pounds of weight or as suggested by your caregiver.  You notice sudden swelling of your face, hands, and feet or legs.  You develop belly (abdominal) pain. Round ligament discomfort is a common non-cancerous (benign) cause of abdominal pain in pregnancy. Your caregiver still  must evaluate you.  You develop a severe headache that does not go away.  You develop visual problems, blurred or double vision.  If you have not felt your baby move for more than 1 hour. If you think the baby is not moving as much as usual, eat something with sugar in it and lie down on your left side for an hour. The baby should move at least 4 to 5 times per hour. Call right away if your baby moves less than that.  You fall, are in a car accident, or any kind of trauma.  There is mental or physical violence at home. Document Released: 01/14/2001   Document Revised: 10/15/2011 Document Reviewed: 07/19/2008 [employer] Patient Information 2014 [employer] Fetal Movement Counts Patient Name: __________________________________________________ Patient Due Date: ____________________ Performing a fetal movement count is highly recommended in high-risk pregnancies, but it is good for every pregnant woman to do. Your caregiver may ask you to start counting fetal movements at 28 weeks of the pregnancy. Fetal movements often increase:  After eating a full meal.  After physical activity.  After eating or drinking something sweet or cold.  At rest. Pay attention to when you feel the baby is most active. This will help you notice a pattern of your baby's sleep and wake cycles and what factors contribute to an increase in fetal movement. It is important to perform a fetal movement count at the same time each day when your baby is normally most active.  HOW TO COUNT FETAL MOVEMENTS 1. Find a quiet and comfortable area to sit or lie down on your left side. Lying on your left side provides the best blood and oxygen circulation to your baby. 2. Write down the day and time on a sheet of paper or in a journal. 3. Start counting kicks, flutters, swishes, rolls, or jabs in a 2 hour period. You should feel at least 10 movements within 2 hours. 4. If you do not feel 10 movements in 2 hours, wait 2 3 hours and  count again. Look for a change in the pattern or not enough counts in 2 hours. SEEK MEDICAL CARE IF:  You feel less than 10 counts in 2 hours, tried twice.  There is no movement in over an hour.  The pattern is changing or taking longer each day to reach 10 counts in 2 hours.  You feel the baby is not moving as he or she usually does. Date: ____________ Movements: ____________ Start time: ____________ Finish time: ____________  Date: ____________ Movements: ____________ Start time: ____________ Finish time: ____________ Date: ____________ Movements: ____________ Start time: ____________ Finish time: ____________ Date: ____________ Movements: ____________ Start time: ____________ Finish time: ____________ Date: ____________ Movements: ____________ Start time: ____________ Finish time: ____________ Date: ____________ Movements: ____________ Start time: ____________ Finish time: ____________ Date: ____________ Movements: ____________ Start time: ____________ Finish time: ____________ Date: ____________ Movements: ____________ Start time: ____________ Finish time: ____________  Date: ____________ Movements: ____________ Start time: ____________ Finish time: ____________ Date: ____________ Movements: ____________ Start time: ____________ Finish time: ____________ Date: ____________ Movements: ____________ Start time: ____________ Finish time: ____________ Date: ____________ Movements: ____________ Start time: ____________ Finish time: ____________ Date: ____________ Movements: ____________ Start time: ____________ Finish time: ____________ Date: ____________ Movements: ____________ Start time: ____________ Finish time: ____________ Date: ____________ Movements: ____________ Start time: ____________ Finish time: ____________  Date: ____________ Movements: ____________ Start time: ____________ Finish time: ____________ Date: ____________ Movements: ____________ Start time: ____________ Finish  time: ____________ Date: ____________ Movements: ____________ Start time: ____________ Finish time: ____________ Date: ____________ Movements: ____________ Start time: ____________ Finish time: ____________ Date: ____________ Movements: ____________ Start time: ____________ Finish time: ____________ Date: ____________ Movements: ____________ Start time: ____________ Finish time: ____________ Date: ____________ Movements: ____________ Start time: ____________ Finish time: ____________  Date: ____________ Movements: ____________ Start time: ____________ Finish time: ____________ Date: ____________ Movements: ____________ Start time: ____________ Finish time: ____________ Date: ____________ Movements: ____________ Start time: ____________ Finish time: ____________ Date: ____________ Movements: ____________ Start time: ____________ Finish time: ____________ Date: ____________ Movements: ____________ Start time: ____________ Finish time: ____________ Date: ____________ Movements: ____________ Start time: ____________ Finish time: ____________ Date: ____________ Movements: ____________ Start time: ____________   Finish time: ____________  Date: ____________ Movements: ____________ Start time: ____________ Finish time: ____________ Date: ____________ Movements: ____________ Start time: ____________ Finish time: ____________ Date: ____________ Movements: ____________ Start time: ____________ Finish time: ____________ Date: ____________ Movements: ____________ Start time: ____________ Finish time: ____________ Date: ____________ Movements: ____________ Start time: ____________ Finish time: ____________ Date: ____________ Movements: ____________ Start time: ____________ Finish time: ____________ Date: ____________ Movements: ____________ Start time: ____________ Finish time: ____________  Date: ____________ Movements: ____________ Start time: ____________ Finish time: ____________ Date: ____________  Movements: ____________ Start time: ____________ Finish time: ____________ Date: ____________ Movements: ____________ Start time: ____________ Finish time: ____________ Date: ____________ Movements: ____________ Start time: ____________ Finish time: ____________ Date: ____________ Movements: ____________ Start time: ____________ Finish time: ____________ Date: ____________ Movements: ____________ Start time: ____________ Finish time: ____________ Date: ____________ Movements: ____________ Start time: ____________ Finish time: ____________  Date: ____________ Movements: ____________ Start time: ____________ Finish time: ____________ Date: ____________ Movements: ____________ Start time: ____________ Finish time: ____________ Date: ____________ Movements: ____________ Start time: ____________ Finish time: ____________ Date: ____________ Movements: ____________ Start time: ____________ Finish time: ____________ Date: ____________ Movements: ____________ Start time: ____________ Finish time: ____________ Date: ____________ Movements: ____________ Start time: ____________ Finish time: ____________ Date: ____________ Movements: ____________ Start time: ____________ Finish time: ____________  Date: ____________ Movements: ____________ Start time: ____________ Finish time: ____________ Date: ____________ Movements: ____________ Start time: ____________ Finish time: ____________ Date: ____________ Movements: ____________ Start time: ____________ Finish time: ____________ Date: ____________ Movements: ____________ Start time: ____________ Finish time: ____________ Date: ____________ Movements: ____________ Start time: ____________ Finish time: ____________ Date: ____________ Movements: ____________ Start time: ____________ Finish time: ____________ Document Released: 02/19/2006 Document Revised: 01/07/2012 Document Reviewed: 11/17/2011 [employer] Patient Information 2014 [employer].  

## 2012-08-25 NOTE — Progress Notes (Signed)
Pulse- 97  Edema-pitting edema on legs Pt reports sharp pain in vaginal area after intercourse

## 2012-08-25 NOTE — Progress Notes (Signed)
Monique Lyons is a 27 y.o. [phone] at [redacted]w[redacted]d by R=16  here for ROB visit.  +FM, no LOF, No VB, No ctx Discussed echogenic foci present, reviewed quad screen Occasionally feels under the weather. Rare.  Discussed with Patient:  -Plans to unsure breast/bottle feed.  All questions answered. -Continue prenatal vitamins. -Reviewed fetal kick counts Pt to perform daily at a time when the baby is active, lie laterally with both hands on belly in quiet room and count all movements (hiccups, shoulder rolls, obvious kicks, etc); pt is to report to clinic L&D for less than 10 movements felt in a one hour time period-pt told as soon as she counts 10 movements the count is complete.  - Routine precautions discussed (depression, infection s/s).   Patient provided with all pertinent phone numbers for emergencies. - RTC for any VB, regular, painful cramps/ctxs occurring at a rate of >2/10 min, fever (100.5 or higher), n/v/d, any pain that is unresolving or worsening, LOF, decreased fetal movement, CP, SOB, edema - RTC in 2 weeks for next appt.  Problems: Patient Active Problem List   Diagnosis Date Noted  . Placenta previa antepartum in second trimester 08/02/2012  . Echogenic focus of heart, fetal, affecting care of mother, antepartum 08/02/2012  . Supervision of normal IUP (intrauterine pregnancy) in multigravida 01/04/2011  . Previous cesarean delivery affecting pregnancy, antepartum 01/04/2011  . Insufficient prenatal care 01/04/2011  . Need for immunization against rubella 01/04/2011    To Do: 1. Tdap Next visit  [ ]  Vaccines: Flu:  Tdap: next vist [ ]  BCM:  BTL [ ]  Readiness: baby has a place to sleep, car seat, other baby necessities.  Edu: [x ] PTL precautions; [ ]  BF class; [ ]  childbirth class; [ ]   BF counseling;

## 2012-09-07 ENCOUNTER — Encounter: Payer: Self-pay | Admitting: Family Medicine

## 2012-09-07 ENCOUNTER — Ambulatory Visit (INDEPENDENT_AMBULATORY_CARE_PROVIDER_SITE_OTHER): Payer: Medicaid Other | Admitting: Family Medicine

## 2012-09-07 VITALS — BP 117/71 | Temp 97.6°F | Wt 169.6 lb

## 2012-09-07 DIAGNOSIS — O0930 Supervision of pregnancy with insufficient antenatal care, unspecified trimester: Secondary | ICD-10-CM

## 2012-09-07 DIAGNOSIS — Z3483 Encounter for supervision of other normal pregnancy, third trimester: Secondary | ICD-10-CM

## 2012-09-07 DIAGNOSIS — F17200 Nicotine dependence, unspecified, uncomplicated: Secondary | ICD-10-CM | POA: Insufficient documentation

## 2012-09-07 DIAGNOSIS — O34219 Maternal care for unspecified type scar from previous cesarean delivery: Secondary | ICD-10-CM

## 2012-09-07 LAB — POCT URINALYSIS DIP (DEVICE)
Hgb urine dipstick: NEGATIVE
Protein, ur: NEGATIVE mg/dL
Specific Gravity, Urine: 1.015 (ref 1.005–1.030)
Urobilinogen, UA: 0.2 mg/dL (ref 0.0–1.0)

## 2012-09-07 NOTE — Progress Notes (Signed)
RLP, smoking cessation discussed. Plans PPS.

## 2012-09-07 NOTE — Patient Instructions (Addendum)
Pregnancy - Third Trimester The third trimester of pregnancy (the last 3 months) is a period of the most rapid growth for you and your baby. The baby approaches a length of 20 inches and a weight of 6 to 10 pounds. The baby is adding on fat and getting ready for life outside your body. While inside, babies have periods of sleeping and waking, sucking thumbs, and hiccuping. You can often feel small contractions of the uterus. This is false labor. It is also called Braxton-Hicks contractions. This is like a practice for labor. The usual problems in this stage of pregnancy include more difficulty breathing, swelling of the hands and feet from water retention, and having to urinate more often because of the uterus and baby pressing on your bladder.  PRENATAL EXAMS  Blood work may continue to be done during prenatal exams. These tests are done to check on your health and the probable health of your baby. Blood work is used to follow your blood levels (hemoglobin). Anemia (low hemoglobin) is common during pregnancy. Iron and vitamins are given to help prevent this. You may also continue to be checked for diabetes. Some of the past blood tests may be done again.  The size of the uterus is measured during each visit. This makes sure your baby is growing properly according to your pregnancy dates.  Your blood pressure is checked every prenatal visit. This is to make sure you are not getting toxemia.  Your urine is checked every prenatal visit for infection, diabetes, and protein.  Your weight is checked at each visit. This is done to make sure gains are happening at the suggested rate and that you and your baby are growing normally.  Sometimes, an ultrasound is performed to confirm the position and the proper growth and development of the baby. This is a test done that bounces harmless sound waves off the baby so your caregiver can more accurately determine a due date.  Discuss the type of pain medicine and  anesthesia you will have during your labor and delivery.  Discuss the possibility and anesthesia if a cesarean section might be necessary.  Inform your caregiver if there is any mental or physical violence at home. Sometimes, a specialized non-stress test, contraction stress test, and biophysical profile are done to make sure the baby is not having a problem. Checking the amniotic fluid surrounding the baby is called an amniocentesis. The amniotic fluid is removed by sticking a needle into the belly (abdomen). This is sometimes done near the end of pregnancy if an early delivery is required. In this case, it is done to help make sure the baby's lungs are mature enough for the baby to live outside of the womb. If the lungs are not mature and it is unsafe to deliver the baby, an injection of cortisone medicine is given to the mother 1 to 2 days before the delivery. This helps the baby's lungs mature and makes it safer to deliver the baby. CHANGES OCCURING IN THE THIRD TRIMESTER OF PREGNANCY Your body goes through many changes during pregnancy. They vary from person to person. Talk to your caregiver about changes you notice and are concerned about.  During the last trimester, you have probably had an increase in your appetite. It is normal to have cravings for certain foods. This varies from person to person and pregnancy to pregnancy.  You may begin to get stretch marks on your hips, abdomen, and breasts. These are normal changes in the body   during pregnancy. There are no exercises or medicines to take which prevent this change.  Constipation may be treated with a stool softener or adding bulk to your diet. Drinking lots of fluids, fiber in vegetables, fruits, and whole grains are helpful.  Exercising is also helpful. If you have been very active up until your pregnancy, most of these activities can be continued during your pregnancy. If you have been less active, it is helpful to start an exercise  program such as walking. Consult your caregiver before starting exercise programs.  Avoid all smoking, alcohol, non-prescribed drugs, herbs and "street drugs" during your pregnancy. These chemicals affect the formation and growth of the baby. Avoid chemicals throughout the pregnancy to ensure the delivery of a healthy infant.  Backache, varicose veins, and hemorrhoids may develop or get worse.  You will tire more easily in the third trimester, which is normal.  The baby's movements may be stronger and more often.  You may become short of breath easily.  Your belly button may stick out.  A yellow discharge may leak from your breasts called colostrum.  You may have a bloody mucus discharge. This usually occurs a few days to a week before labor begins. HOME CARE INSTRUCTIONS   Keep your caregiver's appointments. Follow your caregiver's instructions regarding medicine use, exercise, and diet.  During pregnancy, you are providing food for you and your baby. Continue to eat regular, well-balanced meals. Choose foods such as meat, fish, milk and other low fat dairy products, vegetables, fruits, and whole-grain breads and cereals. Your caregiver will tell you of the ideal weight gain.  A physical sexual relationship may be continued throughout pregnancy if there are no other problems such as early (premature) leaking of amniotic fluid from the membranes, vaginal bleeding, or belly (abdominal) pain.  Exercise regularly if there are no restrictions. Check with your caregiver if you are unsure of the safety of your exercises. Greater weight gain will occur in the last 2 trimesters of pregnancy. Exercising helps:  Control your weight.  Get you in shape for labor and delivery.  You lose weight after you deliver.  Rest a lot with legs elevated, or as needed for leg cramps or low back pain.  Wear a good support or jogging bra for breast tenderness during pregnancy. This may help if worn during  sleep. Pads or tissues may be used in the bra if you are leaking colostrum.  Do not use hot tubs, steam rooms, or saunas.  Wear your seat belt when driving. This protects you and your baby if you are in an accident.  Avoid raw meat, cat litter boxes and soil used by cats. These carry germs that can cause birth defects in the baby.  It is easier to leak urine during pregnancy. Tightening up and strengthening the pelvic muscles will help with this problem. You can practice stopping your urination while you are going to the bathroom. These are the same muscles you need to strengthen. It is also the muscles you would use if you were trying to stop from passing gas. You can practice tightening these muscles up 10 times a set and repeating this about 3 times per day. Once you know what muscles to tighten up, do not perform these exercises during urination. It is more likely to cause an infection by backing up the urine.  Ask for help if you have financial, counseling, or nutritional needs during pregnancy. Your caregiver will be able to offer counseling for these   needs as well as refer you for other special needs.  Make a list of emergency phone numbers and have them available.  Plan on getting help from family or friends when you go home from the hospital.  Make a trial run to the hospital.  Take prenatal classes with the father to understand, practice, and ask questions about the labor and delivery.  Prepare the baby's room or nursery.  Do not travel out of the city unless it is absolutely necessary and with the advice of your caregiver.  Wear only low or no heal shoes to have better balance and prevent falling. MEDICINES AND DRUG USE IN PREGNANCY  Take prenatal vitamins as directed. The vitamin should contain 1 milligram of folic acid. Keep all vitamins out of reach of children. Only a couple vitamins or tablets containing iron may be fatal to a baby or young child when ingested.  Avoid use  of all medicines, including herbs, over-the-counter medicines, not prescribed or suggested by your caregiver. Only take over-the-counter or prescription medicines for pain, discomfort, or fever as directed by your caregiver. Do not use aspirin, ibuprofen or naproxen unless approved by your caregiver.  Let your caregiver also know about herbs you may be using.  Alcohol is related to a number of birth defects. This includes fetal alcohol syndrome. All alcohol, in any form, should be avoided completely. Smoking will cause low birth rate and premature babies.  Illegal drugs are very harmful to the baby. They are absolutely forbidden. A baby born to an addicted mother will be addicted at birth. The baby will go through the same withdrawal an adult does. SEEK MEDICAL CARE IF: You have any concerns or worries during your pregnancy. It is better to call with your questions if you feel they cannot wait, rather than worry about them. SEEK IMMEDIATE MEDICAL CARE IF:   An unexplained oral temperature above 102 F (38.9 C) develops, or as your caregiver suggests.  You have leaking of fluid from the vagina. If leaking membranes are suspected, take your temperature and tell your caregiver of this when you call.  There is vaginal spotting, bleeding or passing clots. Tell your caregiver of the amount and how many pads are used.  You develop a bad smelling vaginal discharge with a change in the color from clear to white.  You develop vomiting that lasts more than 24 hours.  You develop chills or fever.  You develop shortness of breath.  You develop burning on urination.  You loose more than 2 pounds of weight or gain more than 2 pounds of weight or as suggested by your caregiver.  You notice sudden swelling of your face, hands, and feet or legs.  You develop belly (abdominal) pain. Round ligament discomfort is a common non-cancerous (benign) cause of abdominal pain in pregnancy. Your caregiver still  must evaluate you.  You develop a severe headache that does not go away.  You develop visual problems, blurred or double vision.  If you have not felt your baby move for more than 1 hour. If you think the baby is not moving as much as usual, eat something with sugar in it and lie down on your left side for an hour. The baby should move at least 4 to 5 times per hour. Call right away if your baby moves less than that.  You fall, are in a car accident, or any kind of trauma.  There is mental or physical violence at home. Document Released: 01/14/2001   Document Revised: 10/15/2011 Document Reviewed: 07/19/2008 [facility] Patient Information 2014 [place], [place]. Smoking Cessation Quitting smoking is important to your health and has many advantages. However, it is not always easy to quit since nicotine is a very addictive drug. Often times, people try 3 times or more before being able to quit. This document explains the best ways for you to prepare to quit smoking. Quitting takes hard work and a lot of effort, but you can do it. ADVANTAGES OF QUITTING SMOKING  You will live longer, feel better, and live better.  Your body will feel the impact of quitting smoking almost immediately.  Within 20 minutes, blood pressure decreases. Your pulse returns to its normal level.  After 8 hours, carbon monoxide levels in the blood return to normal. Your oxygen level increases.  After 24 hours, the chance of having a heart attack starts to decrease. Your breath, hair, and body stop smelling like smoke.  After 48 hours, damaged nerve endings begin to recover. Your sense of taste and smell improve.  After 72 hours, the body is virtually free of nicotine. Your bronchial tubes relax and breathing becomes easier.  After 2 to 12 weeks, lungs can hold more air. Exercise becomes easier and circulation improves.  The risk of having a heart attack, stroke, cancer, or lung disease is greatly reduced.  After 1  year, the risk of coronary heart disease is cut in half.  After 5 years, the risk of stroke falls to the same as a nonsmoker.  After 10 years, the risk of lung cancer is cut in half and the risk of other cancers decreases significantly.  After 15 years, the risk of coronary heart disease drops, usually to the level of a nonsmoker.  If you are pregnant, quitting smoking will improve your chances of having a healthy baby.  The people you live with, especially any children, will be healthier.  You will have extra money to spend on things other than cigarettes. QUESTIONS TO THINK ABOUT BEFORE ATTEMPTING TO QUIT You may want to talk about your answers with your caregiver.  Why do you want to quit?  If you tried to quit in the past, what helped and what did not?  What will be the most difficult situations for you after you quit? How will you plan to handle them?  Who can help you through the tough times? Your family? Friends? A caregiver?  What pleasures do you get from smoking? What ways can you still get pleasure if you quit? Here are some questions to ask your caregiver:  How can you help me to be successful at quitting?  What medicine do you think would be best for me and how should I take it?  What should I do if I need more help?  What is smoking withdrawal like? How can I get information on withdrawal? GET READY  Set a quit date.  Change your environment by getting rid of all cigarettes, ashtrays, matches, and lighters in your home, car, or work. Do not let people smoke in your home.  Review your past attempts to quit. Think about what worked and what did not. GET SUPPORT AND ENCOURAGEMENT You have a better chance of being successful if you have help. You can get support in many ways.  Tell your family, friends, and co-workers that you are going to quit and need their support. Ask them not to smoke around you.  Get individual, group, or telephone counseling and support.  Programs are  available at local hospitals and health centers. Call your local health department for information about programs in your area.  Spiritual beliefs and practices may help some smokers quit.  Download a "quit meter" on your computer to keep track of quit statistics, such as how long you have gone without smoking, cigarettes not smoked, and money saved.  Get a self-help book about quitting smoking and staying off of tobacco. LEARN NEW SKILLS AND BEHAVIORS  Distract yourself from urges to smoke. Talk to someone, go for a walk, or occupy your time with a task.  Change your normal routine. Take a different route to work. Drink tea instead of coffee. Eat breakfast in a different place.  Reduce your stress. Take a hot bath, exercise, or read a book.  Plan something enjoyable to do every day. Reward yourself for not smoking.  Explore interactive web-based programs that specialize in helping you quit. GET MEDICINE AND USE IT CORRECTLY Medicines can help you stop smoking and decrease the urge to smoke. Combining medicine with the above behavioral methods and support can greatly increase your chances of successfully quitting smoking.  Nicotine replacement therapy helps deliver nicotine to your body without the negative effects and risks of smoking. Nicotine replacement therapy includes nicotine gum, lozenges, inhalers, nasal sprays, and skin patches. Some may be available over-the-counter and others require a prescription.  Antidepressant medicine helps people abstain from smoking, but how this works is unknown. This medicine is available by prescription.  Nicotinic receptor partial agonist medicine simulates the effect of nicotine in your brain. This medicine is available by prescription. Ask your caregiver for advice about which medicines to use and how to use them based on your health history. Your caregiver will tell you what side effects to look out for if you choose to be on a  medicine or therapy. Carefully read the information on the package. Do not use any other product containing nicotine while using a nicotine replacement product.  RELAPSE OR DIFFICULT SITUATIONS Most relapses occur within the first 3 months after quitting. Do not be discouraged if you start smoking again. Remember, most people try several times before finally quitting. You may have symptoms of withdrawal because your body is used to nicotine. You may crave cigarettes, be irritable, feel very hungry, cough often, get headaches, or have difficulty concentrating. The withdrawal symptoms are only temporary. They are strongest when you first quit, but they will go away within 10 14 days. To reduce the chances of relapse, try to:  Avoid drinking alcohol. Drinking lowers your chances of successfully quitting.  Reduce the amount of caffeine you consume. Once you quit smoking, the amount of caffeine in your body increases and can give you symptoms, such as a rapid heartbeat, sweating, and anxiety.  Avoid smokers because they can make you want to smoke.  Do not let weight gain distract you. Many smokers will gain weight when they quit, usually less than 10 pounds. Eat a healthy diet and stay active. You can always lose the weight gained after you quit.  Find ways to improve your mood other than smoking. FOR MORE INFORMATION  www.smokefree.gov  Document Released: 01/14/2001 Document Revised: 07/22/2011 Document Reviewed: 05/01/2011 [facility] Patient Information 2014 [place], [place]. Round Ligament Pain The round ligament is made up of muscle and fibrous tissue. It is attached to the uterus near the fallopian tube. The round ligament is located on both sides of the uterus and helps support the position of the uterus. It usually begins  in the second trimester of pregnancy when the uterus comes out of the pelvis. The pain can come and go until the baby is delivered. Round ligament pain is not a serious problem  and does not cause harm to the baby. CAUSE During pregnancy the uterus grows the most from the second trimester to delivery. As it grows, it stretches and slightly twists the round ligaments. When the uterus leans from one side to the other, the round ligament on the opposite side pulls and stretches. This can cause pain. SYMPTOMS  Pain can occur on one side or both sides. The pain is usually a short, sharp, and pinching-like. Sometimes it can be a dull, lingering and aching pain. The pain is located in the lower side of the abdomen or in the groin. The pain is internal and usually starts deep in the groin and moves up to the outside of the hip area. Pain can occur with:  Sudden change in position like getting out of bed or a chair.  Rolling over in bed.  Coughing or sneezing.  Walking too much.  Any type of physical activity. DIAGNOSIS  Your caregiver will make sure there are no serious problems causing the pain. When nothing serious is found, the symptoms usually indicate that the pain is from the round ligament. TREATMENT   Sit down and relax when the pain starts.  Flex your knees up to your belly.  Lay on your side with a pillow under your belly (abdomen) and another one between your legs.  Sit in a hot bath for 15 to 20 minutes or until the pain goes away. HOME CARE INSTRUCTIONS   Only take over-the-counter or prescriptions medicines for pain, discomfort or fever as directed by your caregiver.  Sit and stand slowly.  Avoid long walks if it causes pain.  Stop or lessen your physical activities if it causes pain. SEEK MEDICAL CARE IF:   The pain does not go away with any of your treatment.  You need stronger medication for the pain.  You develop back pain that you did not have before with the side pain. SEEK IMMEDIATE MEDICAL CARE IF:   You develop a temperature of 102 F (38.9 C) or higher.  You develop uterine contractions.  You develop vaginal bleeding.  You  develop nausea, vomiting or diarrhea.  You develop chills.  You have pain when you urinate. Document Released: 10/30/2007 Document Revised: 04/14/2011 Document Reviewed: 10/30/2007 [facility] Patient Information 2014 [place], [place].

## 2012-09-07 NOTE — Progress Notes (Signed)
Pulse- 103 Patient reports occasional vaginal pain; also reports heart pain & SOB, notices most often in the morning when she wakes up

## 2012-09-21 ENCOUNTER — Emergency Department (HOSPITAL_COMMUNITY)
Admission: EM | Admit: 2012-09-21 | Discharge: 2012-09-21 | Disposition: A | Discharge status: Home / Self Care | Payer: Medicaid Other | Attending: Emergency Medicine | Admitting: Emergency Medicine

## 2012-09-21 ENCOUNTER — Encounter (HOSPITAL_COMMUNITY): Payer: Self-pay | Admitting: Cardiology

## 2012-09-21 DIAGNOSIS — Z880 Allergy status to penicillin: Secondary | ICD-10-CM | POA: Insufficient documentation

## 2012-09-21 DIAGNOSIS — R300 Dysuria: Secondary | ICD-10-CM | POA: Insufficient documentation

## 2012-09-21 DIAGNOSIS — M7989 Other specified soft tissue disorders: Secondary | ICD-10-CM | POA: Insufficient documentation

## 2012-09-21 DIAGNOSIS — R079 Chest pain, unspecified: Secondary | ICD-10-CM

## 2012-09-21 DIAGNOSIS — Z8742 Personal history of other diseases of the female genital tract: Secondary | ICD-10-CM | POA: Insufficient documentation

## 2012-09-21 DIAGNOSIS — M79609 Pain in unspecified limb: Secondary | ICD-10-CM

## 2012-09-21 DIAGNOSIS — Z79899 Other long term (current) drug therapy: Secondary | ICD-10-CM | POA: Insufficient documentation

## 2012-09-21 DIAGNOSIS — F17200 Nicotine dependence, unspecified, uncomplicated: Secondary | ICD-10-CM | POA: Insufficient documentation

## 2012-09-21 DIAGNOSIS — R0789 Other chest pain: Secondary | ICD-10-CM | POA: Insufficient documentation

## 2012-09-21 DIAGNOSIS — Z3490 Encounter for supervision of normal pregnancy, unspecified, unspecified trimester: Secondary | ICD-10-CM

## 2012-09-21 DIAGNOSIS — N898 Other specified noninflammatory disorders of vagina: Secondary | ICD-10-CM | POA: Insufficient documentation

## 2012-09-21 LAB — POCT I-STAT TROPONIN I: Troponin i, poc: 0.02 ng/mL (ref 0.00–0.08)

## 2012-09-21 LAB — BASIC METABOLIC PANEL
Chloride: 104 mEq/L (ref 96–112)
Creatinine, Ser: 0.42 mg/dL — ABNORMAL LOW (ref 0.50–1.10)
GFR calc Af Amer: >90 mL/min (ref >90)
Potassium: 3.6 mEq/L (ref 3.5–5.1)

## 2012-09-21 LAB — URINALYSIS, ROUTINE W REFLEX MICROSCOPIC
Hgb urine dipstick: NEGATIVE
Protein, ur: NEGATIVE mg/dL
Urobilinogen, UA: 0.2 mg/dL (ref 0.0–1.0)

## 2012-09-21 LAB — CBC
Platelets: 225 10*3/uL (ref 150–400)
RDW: 13.1 % (ref 11.5–15.5)
WBC: 10.2 10*3/uL (ref 4.0–10.5)

## 2012-09-21 NOTE — Progress Notes (Signed)
Dr. Macon Large notified that fhr is reactive, pt is not contracting, v/s are stable , 02 sats are 100%. Pt is cleared obstetrically. Fine to d/c efm. She says that the ED staff can do a CT scan if they are concerned about a PE. Pt should follow up in the clinic. Pt instructed to call in the morning for an appointment. Dr. Mont Dutton recommendation for aCT scan shared with the pt's ED RN.

## 2012-09-21 NOTE — ED Notes (Signed)
Pt called once for triage, no answer x1

## 2012-09-21 NOTE — ED Provider Notes (Signed)
CSN: [id number]     Arrival date & time 09/21/12  1701 History     First MD Initiated Contact with Patient 09/21/12 1729     Chief Complaint  Patient presents with  . Chest Pain   (Consider location/radiation/quality/duration/timing/severity/associated sxs/prior Treatment) HPI  27 year old female G5 P3023 at 36wk presents with right-sided pleuritic chest pain. Patient reports he woke up this morning with right-sided chest pain worse with deep breaths and movement.  Denies any shortness of breath, lightheadedness, palpitation, nausea, vomiting. He does note that she's had right leg swelling and pain greater than left, however notes that today they appear to be equal.  Denies any history of DVT/PE, family history of PE/DVT (however notes does not know her father), no recent surg/immobilization/long trips. Denies cough or fever. Pain moderate and worse with movement and palpation. Patient and outpatient notes report pt having chest pain as well at the beginning of August.  Pt states this was pleuritic and worse in the mornings, and associated with shortness of breath at that time however this resolved spontaneously without requiring further workup. Denies any vaginal bleeding, leakage of fluid, contractions. Notes change in quality fetal movement however continue fetal movement.       .Past Medical History  Diagnosis Date  . Ovarian cyst   . No pertinent past medical history    Past Surgical History  Procedure Laterality Date  . Cesarean section      first was c-section; second vbac  . Cesarean section  December 28, 2003   Family History  Problem Relation Age of Onset  . Endometriosis Mother    History  Substance Use Topics  . Smoking status: Current Some Day Smoker -- 0.50 packs/day    Types: Cigarettes  . Smokeless tobacco: Never Used  . Alcohol Use: No   OB History   Grav Para Term Preterm Abortions TAB SAB Ect Mult Living   5 3 3  1     3      Review of Systems   Constitutional: Negative for fever and chills.  HENT: Negative for sore throat and neck pain.   Eyes: Negative for visual disturbance.  Respiratory: Negative for cough and shortness of breath.   Cardiovascular: Positive for chest pain and leg swelling. Negative for palpitations.  Gastrointestinal: Negative for nausea, vomiting and abdominal pain.  Genitourinary: Positive for dysuria and vaginal discharge (no leakage). Negative for vaginal bleeding, difficulty urinating, menstrual problem and pelvic pain.  Musculoskeletal: Negative for back pain.  Skin: Negative for rash.  Neurological: Negative for syncope and headaches.    Allergies  Amoxicillin and Penicillins  Home Medications   Current Outpatient Rx  Name  Route  Sig  Dispense  Refill  . fish oil-omega-3 fatty acids 1000 MG capsule   Oral   Take 2 g by mouth daily.         . Prenatal Vit-Fe Fumarate-FA (PRENATAL COMPLETE PO)   Oral   Take 1 tablet by mouth daily.           BP 120/70  Pulse 92  Temp(Src) 98.6 F (37 C) (Oral)  Resp 16  Ht 5\' 9"  (1.753 m)  Wt 169 lb (76.658 kg)  BMI 24.95 kg/m2  SpO2 98%  LMP 08/01/2011 Physical Exam  Nursing note and vitals reviewed. Constitutional: She is oriented to person, place, and time. She appears well-developed and well-nourished. No distress.  HENT:  Head: Normocephalic and atraumatic.  Eyes: Conjunctivae and EOM are normal.  Neck: Normal range  of motion.  Cardiovascular: Normal rate, regular rhythm, normal heart sounds and intact distal pulses.  Exam reveals no gallop and no friction rub.   No murmur heard. Pulmonary/Chest: Effort normal and breath sounds normal. No respiratory distress. She has no wheezes. She has no rales. She exhibits tenderness (severe right sided).  Abdominal: Soft. She exhibits no distension. There is no tenderness. There is no guarding.  Musculoskeletal: She exhibits no edema and no tenderness.  Neurological: She is alert and oriented to  person, place, and time.  Skin: Skin is warm and dry. No rash noted. She is not diaphoretic. No erythema.    ED Course   Procedures (including critical care time)  Labs Reviewed  CBC - Abnormal; Notable for the following:    HCT 35.9 (*)    All other components within normal limits  BASIC METABOLIC PANEL - Abnormal; Notable for the following:    BUN 4 (*)    Creatinine, Ser 0.42 (*)    All other components within normal limits  URINALYSIS, ROUTINE W REFLEX MICROSCOPIC - Abnormal; Notable for the following:    Ketones, ur 15 (*)    All other components within normal limits  URINE CULTURE  POCT I-STAT TROPONIN I   Date: 09/22/2012  Rate: 90  Rhythm: normal sinus rhythm  QRS Axis: normal  Intervals: normal  ST/T Wave abnormalities: normal  Conduction Disutrbances:none  Narrative Interpretation:   Old EKG Reviewed: none available   No results found. 1. Pregnancy   2. Chest pain, likely musculoskeletal     MDM  27 year old female G5 P3023 at 36wk presents with right-sided pleuritic chest pain worse with movement and palpation. Patient placed on fetal monitor by [facility] nurse and on the monitor for over one hour with a reactive NST, normal HR. Patient without obstetric complaints, and is here for chest pain.  EKG evaluated by me and shows normal sinus rhythm without acute ST changes.  Troponin ordered in triage is negative. CBC and BMP within normal limits. Urinalysis shows no signs of urinary tract infection.  Given patient's history of right lower extremity swelling greater than left, a DVT vascular study was done which showed no signs of acute DVT.  Patient with significant chest wall tenderness on exam, without tachycardia, without tachypnea, without shortness of breath, without hypoxia and had long discussion of the risks versus benefits of further evaluation for PE in the emergency department with a CT scan. Patient with pleuritic chest pain and risk for PE given pregnancy, however  with other indicators that might represent musculoskeletal pain, and normal vital signs/no SOB. Patient also had a similar episode earlier in August for her to seen as an outpatient that resolved spontaneously. Patient decided to monitor symptoms conservatively at this time, and will return to the emergency department if she develops any worsening symptoms of shortness of breath, worsening chest pain, lightheadedness or if she decides she is interested in CT scan as discussed.  Patient discharged in stable condition with understanding of reasons to return. Recommended close OB followup for symptoms and vaginal discharge.        Rhae Lerner, MD 09/22/12 [phone]

## 2012-09-21 NOTE — Progress Notes (Signed)
Pt sitting up. Dr. Radford Pax listening to her lungs. Tracing maternal for a few seconds.

## 2012-09-21 NOTE — Progress Notes (Signed)
Pt is a G5P3 with c/o chest pain and not feeling the baby move. Pt is 36weeks and 4 days. Denies vaginal bleeding  Or leaking of fluid. Pt c/o burning with urination!st baby delivered by C/S, 2 vaginal births since then.

## 2012-09-21 NOTE — Progress Notes (Signed)
*  Preliminary Results* Right lower extremity venous duplex completed. Right lower extremity is negative for deep vein thrombosis. There is no evidence of right Baker's cyst.  Preliminary results discussed with Dr.Schlossman.  09/21/2012 7:25 PM  Gertie Fey, RVT, RDCS, RDMS

## 2012-09-21 NOTE — ED Notes (Signed)
Pt has ride home.

## 2012-09-21 NOTE — ED Notes (Signed)
Pt reports that she woke up this morning with right sided chest pain, also reports a headache with flashing lights at times. 8 months pregnant and has had pre-natal care. States that baby has been moving around but is not as active as she normally is. Denies any bleeding or spotting but reports a discharge.

## 2012-09-21 NOTE — ED Notes (Signed)
OB rapid response RN paged 

## 2012-09-22 LAB — URINE CULTURE

## 2012-09-23 NOTE — ED Provider Notes (Signed)
I saw and evaluated the patient, reviewed the resident's note and I agree with the findings and plan.   .Face to face Exam:  General:  Awake HEENT:  Atraumatic Resp:  Normal effort Abd:  Nondistended Neuro:No focal weakness   Nelia Shi, MD 09/23/12 1211

## 2012-09-24 ENCOUNTER — Encounter: Payer: Self-pay | Admitting: Obstetrics and Gynecology

## 2012-09-24 ENCOUNTER — Ambulatory Visit (INDEPENDENT_AMBULATORY_CARE_PROVIDER_SITE_OTHER): Payer: Medicaid Other | Admitting: Obstetrics and Gynecology

## 2012-09-24 VITALS — BP 106/72 | Temp 97.5°F | Wt 167.5 lb

## 2012-09-24 DIAGNOSIS — O358XX0 Maternal care for other (suspected) fetal abnormality and damage, not applicable or unspecified: Secondary | ICD-10-CM

## 2012-09-24 DIAGNOSIS — O34219 Maternal care for unspecified type scar from previous cesarean delivery: Secondary | ICD-10-CM

## 2012-09-24 DIAGNOSIS — O0933 Supervision of pregnancy with insufficient antenatal care, third trimester: Secondary | ICD-10-CM

## 2012-09-24 DIAGNOSIS — O0930 Supervision of pregnancy with insufficient antenatal care, unspecified trimester: Secondary | ICD-10-CM

## 2012-09-24 DIAGNOSIS — Z3483 Encounter for supervision of other normal pregnancy, third trimester: Secondary | ICD-10-CM

## 2012-09-24 DIAGNOSIS — Z23 Encounter for immunization: Secondary | ICD-10-CM

## 2012-09-24 DIAGNOSIS — O358XX1 Maternal care for other (suspected) fetal abnormality and damage, fetus 1: Secondary | ICD-10-CM

## 2012-09-24 LAB — OB RESULTS CONSOLE GC/CHLAMYDIA
Chlamydia: NEGATIVE
Gonorrhea: NEGATIVE

## 2012-09-24 NOTE — Progress Notes (Signed)
Patient doing well without complaints. FM/labor precautions reviewed. Patient with persistent chest pain unchanged since recent ED visit. Advised to return to MAU if symptoms worsen. Cultures collected today

## 2012-09-24 NOTE — Progress Notes (Signed)
Pulse- 83 Patient reports decreased FM since visit to ER, also reports vaginal cramping, patient states she is still having chest pain on the right side and right arm still feels numb

## 2012-09-25 LAB — GC/CHLAMYDIA PROBE AMP
CT Probe RNA: NEGATIVE
GC Probe RNA: NEGATIVE

## 2012-09-26 LAB — CULTURE, BETA STREP (GROUP B ONLY)

## 2012-09-27 ENCOUNTER — Encounter: Payer: Self-pay | Admitting: Obstetrics and Gynecology

## 2012-09-27 LAB — POCT URINALYSIS DIP (DEVICE)
Ketones, ur: NEGATIVE mg/dL
Nitrite: NEGATIVE
Protein, ur: NEGATIVE mg/dL
Urobilinogen, UA: 0.2 mg/dL (ref 0.0–1.0)

## 2012-09-28 ENCOUNTER — Encounter (HOSPITAL_COMMUNITY): Payer: Self-pay | Admitting: *Deleted

## 2012-09-28 ENCOUNTER — Inpatient Hospital Stay (HOSPITAL_COMMUNITY)
Admission: AD | Admit: 2012-09-28 | Discharge: 2012-09-28 | Disposition: A | Discharge status: Home / Self Care | Payer: Medicaid Other | Source: Ambulatory Visit | Attending: Obstetrics & Gynecology | Admitting: Obstetrics & Gynecology

## 2012-09-28 DIAGNOSIS — O479 False labor, unspecified: Secondary | ICD-10-CM | POA: Insufficient documentation

## 2012-09-28 DIAGNOSIS — O4400 Placenta previa specified as without hemorrhage, unspecified trimester: Secondary | ICD-10-CM

## 2012-09-28 DIAGNOSIS — O358XX1 Maternal care for other (suspected) fetal abnormality and damage, fetus 1: Secondary | ICD-10-CM

## 2012-09-28 DIAGNOSIS — O471 False labor at or after 37 completed weeks of gestation: Secondary | ICD-10-CM

## 2012-09-28 DIAGNOSIS — O4402 Placenta previa specified as without hemorrhage, second trimester: Secondary | ICD-10-CM

## 2012-09-28 LAB — CBC WITH DIFFERENTIAL/PLATELET
Basophils Absolute: 0 10*3/uL (ref 0.0–0.1)
Basophils Relative: 0 % (ref 0–1)
Lymphocytes Relative: 25 % (ref 12–46)
Neutro Abs: 6.9 10*3/uL (ref 1.7–7.7)
Neutrophils Relative %: 69 % (ref 43–77)
Platelets: 210 10*3/uL (ref 150–400)
RDW: 13.1 % (ref 11.5–15.5)
WBC: 9.9 10*3/uL (ref 4.0–10.5)

## 2012-09-28 LAB — COMPREHENSIVE METABOLIC PANEL
ALT: 8 U/L (ref 0–35)
AST: 11 U/L (ref 0–37)
Albumin: 2.9 g/dL — ABNORMAL LOW (ref 3.5–5.2)
CO2: 21 mEq/L (ref 19–32)
Chloride: 103 mEq/L (ref 96–112)
GFR calc non Af Amer: >90 mL/min (ref >90)
Sodium: 135 mEq/L (ref 135–145)
Total Bilirubin: 0.2 mg/dL — ABNORMAL LOW (ref 0.3–1.2)

## 2012-09-28 MED ORDER — GI COCKTAIL ~~LOC~~
30.0000 mL | Freq: Once | ORAL | Status: AC
  Administered 2012-09-28: 30 mL via ORAL
  Filled 2012-09-28: qty 30

## 2012-09-28 NOTE — MAU Provider Note (Signed)
History     CSN: [id number]  Arrival date and time: 09/28/12 1900   First Provider Initiated Contact with Patient 09/28/12 2104      No chief complaint on file.  HPI  Monique Lyons is a 27 y.o. [id number] at [redacted]w[redacted]d who states she is here for labor evaluation. She originally told the RN that she was also having the right sided chest pain that she went to the ED for previously. After asking her about this pain she states that she is not having that pain at this time. She states that she is not having any pain at this time as well. The contractions that she was having have stopped.   Past Medical History  Diagnosis Date  . Ovarian cyst   . No pertinent past medical history     Past Surgical History  Procedure Laterality Date  . Cesarean section      first was c-section; second vbac  . Cesarean section  December 28, 2003    Family History  Problem Relation Age of Onset  . Endometriosis Mother     History  Substance Use Topics  . Smoking status: Current Some Day Smoker -- 0.50 packs/day    Types: Cigarettes  . Smokeless tobacco: Never Used  . Alcohol Use: No    Allergies:  Allergies  Allergen Reactions  . Amoxicillin Other (See Comments)    Patient believes that her reaction may be a rash, but not sure.  Her mom told her that she had a reaction to this medication.  Marland Kitchen Penicillins Rash    Prescriptions prior to admission  Medication Sig Dispense Refill  . Prenatal Vit-Fe Fumarate-FA (PRENATAL COMPLETE PO) Take 1 tablet by mouth daily.       . [DISCONTINUED] fish oil-omega-3 fatty acids 1000 MG capsule Take 2 g by mouth daily.        ROS Physical Exam   Blood pressure 111/72, pulse 84, temperature 98.2 F (36.8 C), temperature source Oral, resp. rate 20, height 5\' 8"  (1.727 m), weight 76.771 kg (169 lb 4 oz), last menstrual period 08/01/2011, SpO2 97.00%.  Physical Exam  Nursing note and vitals reviewed. Constitutional: She is oriented to person, place, and time.  She appears well-developed and well-nourished. No distress.  Cardiovascular: Normal rate.   Respiratory: Effort normal.  GI: Soft. There is no tenderness.  Neurological: She is alert and oriented to person, place, and time.  Skin: Skin is warm.  Psychiatric: She has a normal mood and affect.   NST: 135, moderate with 15x15 accels, no decels Toco: no UCs MAU Course  Procedures  Results for orders placed during the hospital encounter of 09/28/12 (from the past 24 hour(s))  CBC WITH DIFFERENTIAL     Status: Abnormal   Collection Time    09/28/12  9:05 PM      Result Value Range   WBC 9.9  4.0 - 10.5 K/uL   RBC 3.83 (*) 3.87 - 5.11 MIL/uL   Hemoglobin 11.7 (*) 12.0 - 15.0 g/dL   HCT 14.7 (*) 82.9 - 56.2 %   MCV 89.8  78.0 - 100.0 fL   MCH 30.5  26.0 - 34.0 pg   MCHC 34.0  30.0 - 36.0 g/dL   RDW 13.0  86.5 - 78.4 %   Platelets 210  150 - 400 K/uL   Neutrophils Relative % 69  43 - 77 %   Neutro Abs 6.9  1.7 - 7.7 K/uL   Lymphocytes Relative 25  12 - 46 %   Lymphs Abs 2.4  0.7 - 4.0 K/uL   Monocytes Relative 5  3 - 12 %   Monocytes Absolute 0.5  0.1 - 1.0 K/uL   Eosinophils Relative 1  0 - 5 %   Eosinophils Absolute 0.1  0.0 - 0.7 K/uL   Basophils Relative 0  0 - 1 %   Basophils Absolute 0.0  0.0 - 0.1 K/uL  COMPREHENSIVE METABOLIC PANEL     Status: Abnormal   Collection Time    09/28/12  9:05 PM      Result Value Range   Sodium 135  135 - 145 mEq/L   Potassium 3.6  3.5 - 5.1 mEq/L   Chloride 103  96 - 112 mEq/L   CO2 21  19 - 32 mEq/L   Glucose, Bld 71  70 - 99 mg/dL   BUN 4 (*) 6 - 23 mg/dL   Creatinine, Ser 1.61 (*) 0.50 - 1.10 mg/dL   Calcium 8.7  8.4 - 09.6 mg/dL   Total Protein 5.7 (*) 6.0 - 8.3 g/dL   Albumin 2.9 (*) 3.5 - 5.2 g/dL   AST 11  0 - 37 U/L   ALT 8  0 - 35 U/L   Alkaline Phosphatase 125 (*) 39 - 117 U/L   Total Bilirubin 0.2 (*) 0.3 - 1.2 mg/dL   GFR calc non Af Amer >90  >90 mL/min   GFR calc Af Amer >90  >90 mL/min     Assessment and Plan   False labor Labor precautions Fetal kick counts FU with the clinic as scheduled.   Tawnya Crook 09/28/2012, 9:15 PM

## 2012-09-28 NOTE — MAU Note (Addendum)
Patient c/o of Right sided chest pain that feels like a "razor"; Denies pain radiating to neck or shoulder. No diaphoresis noted. Denies blurry vision or headache. Does states feels shob; pulse ox on. Sat 100% Patient states had same symptoms before when visited [place] last Wednesday.

## 2012-09-28 NOTE — MAU Note (Signed)
PT SAYS SHE STARTED HURTING BAD AT 4PM .  PNC- DOWNSTAIRS.    SHE WAS  1 CM ON  Friday.    DENIES HSV AND MRSA.

## 2012-10-05 ENCOUNTER — Encounter: Payer: Self-pay | Admitting: Family Medicine

## 2012-10-05 ENCOUNTER — Ambulatory Visit (INDEPENDENT_AMBULATORY_CARE_PROVIDER_SITE_OTHER): Payer: Medicaid Other | Admitting: Family Medicine

## 2012-10-05 VITALS — BP 109/75 | Temp 98.1°F | Wt 170.0 lb

## 2012-10-05 DIAGNOSIS — O358XX0 Maternal care for other (suspected) fetal abnormality and damage, not applicable or unspecified: Secondary | ICD-10-CM

## 2012-10-05 DIAGNOSIS — O0930 Supervision of pregnancy with insufficient antenatal care, unspecified trimester: Secondary | ICD-10-CM

## 2012-10-05 DIAGNOSIS — Z3483 Encounter for supervision of other normal pregnancy, third trimester: Secondary | ICD-10-CM

## 2012-10-05 DIAGNOSIS — Z23 Encounter for immunization: Secondary | ICD-10-CM

## 2012-10-05 DIAGNOSIS — O34219 Maternal care for unspecified type scar from previous cesarean delivery: Secondary | ICD-10-CM

## 2012-10-05 LAB — POCT URINALYSIS DIP (DEVICE)
Hgb urine dipstick: NEGATIVE
Ketones, ur: NEGATIVE mg/dL
Leukocytes, UA: NEGATIVE
Nitrite: NEGATIVE
Protein, ur: NEGATIVE mg/dL
pH: 5.5 (ref 5.0–8.0)

## 2012-10-05 MED ORDER — TETANUS-DIPHTH-ACELL PERTUSSIS 5-2.5-18.5 LF-MCG/0.5 IM SUSP
0.5000 mL | Freq: Once | INTRAMUSCULAR | Status: AC
  Administered 2012-10-05: 0.5 mL via INTRAMUSCULAR

## 2012-10-05 NOTE — Progress Notes (Signed)
  Subjective:    DAISJA KESSINGER is a 27 y.o. female being seen today for her obstetrical visit. She is at [redacted]w[redacted]d gestation. Patient reports no bleeding, no contractions, no cramping and no leaking. Fetal movement: normal.  Menstrual History: OB History   Grav Para Term Preterm Abortions TAB SAB Ect Mult Living   5 3 3  1     3        Patient's last menstrual period was 08/01/2011.    The following portions of the patient's history were reviewed and updated as appropriate: allergies, current medications, past family history, past medical history, past social history, past surgical history and problem list.  Review of Systems Pertinent items are noted in HPI.   Objective:    BP 109/75  Temp(Src) 98.1 F (36.7 C)  Wt 77.111 kg (170 lb)  BMI 25.85 kg/m2  LMP 08/01/2011 FHT: 135 BPM  Uterine Size: 36 cm and size equals dates  Presentations: cephalic  Pelvic Exam:              Dilation: 2cm       Effacement: 50%             Station:  -3    Consistency: soft            Position: middle     Assessment:  SHAKEITHA UMBAUGH is a 27 y.o. [id number] at [redacted]w[redacted]d here for ROB  Plan:   Discussed with Patient:  - Plans to Breast feed.  All questions answered. - Continue prenatal vitamins. - Reviewed fetal kick counts Pt to perform daily at a time when the baby is active, lie laterally with both hands on belly in quiet room and count all movements (hiccups, shoulder rolls, obvious kicks, etc); pt is to report to clinic MAU for less than 10 movements felt in a 2 hour time period-pt told as soon as she counts 10 movements the count is complete.  - Routine precautions discussed (depression, infection s/s).   Patient provided with all pertinent phone numbers for emergencies. - RTC for any VB, regular, painful cramps/ctxs occurring at a rate of >2/10 min, fever (100.5 or higher), n/v/d, any pain that is unresolving or worsening, LOF, decreased fetal movement, CP, SOB, edema -RTC in one week for next  visit.  Problems: Patient Active Problem List   Diagnosis Date Noted  . Smoker 09/07/2012  . Placenta previa antepartum in second trimester 08/02/2012  . Echogenic focus of heart, fetal, affecting care of mother, antepartum 08/02/2012  . Supervision of normal IUP (intrauterine pregnancy) in multigravida 01/04/2011  . Previous cesarean delivery affecting pregnancy, antepartum 01/04/2011  . Insufficient prenatal care 01/04/2011  . Need for immunization against rubella 01/04/2011    To Do: 1. NTD  [ ]  Vaccines: Flu:  Tdap: will give today [ ]  BCM: BTL [ ]  Readiness: baby has a place to sleep, car seat, other baby necessities.  Edu: [ x] TL precautions; [ ]  BF class; [ ]  childbirth class; [ ]   BF counseling;

## 2012-10-05 NOTE — Patient Instructions (Signed)

## 2012-10-05 NOTE — Progress Notes (Signed)
Pulse- 81 Patient reports lower abdominal pressure

## 2012-10-11 NOTE — MAU Provider Note (Signed)
Attestation of Attending Supervision of Advanced Practitioner (CNM/NP): Evaluation and management procedures were performed by the Advanced Practitioner under my supervision and collaboration. I have reviewed the Advanced Practitioner's note and chart, and I agree with the management and plan.  LEGGETT,KELLY H. 10:00 PM

## 2012-10-12 ENCOUNTER — Ambulatory Visit (INDEPENDENT_AMBULATORY_CARE_PROVIDER_SITE_OTHER): Payer: Medicaid Other | Admitting: Obstetrics & Gynecology

## 2012-10-12 ENCOUNTER — Encounter: Payer: Self-pay | Admitting: Obstetrics & Gynecology

## 2012-10-12 VITALS — BP 107/81 | Wt 170.7 lb

## 2012-10-12 DIAGNOSIS — O0933 Supervision of pregnancy with insufficient antenatal care, third trimester: Secondary | ICD-10-CM

## 2012-10-12 DIAGNOSIS — O0930 Supervision of pregnancy with insufficient antenatal care, unspecified trimester: Secondary | ICD-10-CM

## 2012-10-12 LAB — POCT URINALYSIS DIP (DEVICE)
Ketones, ur: NEGATIVE mg/dL
Protein, ur: NEGATIVE mg/dL
Specific Gravity, Urine: 1.01 (ref 1.005–1.030)
Urobilinogen, UA: 0.2 mg/dL (ref 0.0–1.0)
pH: 6 (ref 5.0–8.0)

## 2012-10-12 NOTE — Progress Notes (Signed)
Pulse: 97

## 2012-10-12 NOTE — Patient Instructions (Signed)

## 2012-10-12 NOTE — Progress Notes (Signed)
Pt c/o ctx a few nights ago that ultimately spaced out and resolved.  No LOF, no VB Labor precautions reviewed

## 2012-10-15 ENCOUNTER — Inpatient Hospital Stay (HOSPITAL_COMMUNITY): Payer: Medicaid Other | Admitting: Anesthesiology

## 2012-10-15 ENCOUNTER — Encounter (HOSPITAL_COMMUNITY): Payer: Self-pay | Admitting: Anesthesiology

## 2012-10-15 ENCOUNTER — Inpatient Hospital Stay (HOSPITAL_COMMUNITY)
Admission: AD | Admit: 2012-10-15 | Discharge: 2012-10-18 | DRG: 767 | Disposition: A | Discharge status: Home / Self Care | Payer: Medicaid Other | Source: Ambulatory Visit | Attending: Obstetrics & Gynecology | Admitting: Obstetrics & Gynecology

## 2012-10-15 ENCOUNTER — Encounter (HOSPITAL_COMMUNITY): Payer: Self-pay | Admitting: *Deleted

## 2012-10-15 DIAGNOSIS — O4290 Premature rupture of membranes, unspecified as to length of time between rupture and onset of labor, unspecified weeks of gestation: Principal | ICD-10-CM | POA: Diagnosis present

## 2012-10-15 DIAGNOSIS — Z302 Encounter for sterilization: Secondary | ICD-10-CM

## 2012-10-15 DIAGNOSIS — O4402 Placenta previa specified as without hemorrhage, second trimester: Secondary | ICD-10-CM

## 2012-10-15 DIAGNOSIS — O368190 Decreased fetal movements, unspecified trimester, not applicable or unspecified: Secondary | ICD-10-CM | POA: Diagnosis present

## 2012-10-15 DIAGNOSIS — O358XX1 Maternal care for other (suspected) fetal abnormality and damage, fetus 1: Secondary | ICD-10-CM

## 2012-10-15 DIAGNOSIS — O34219 Maternal care for unspecified type scar from previous cesarean delivery: Secondary | ICD-10-CM | POA: Diagnosis present

## 2012-10-15 LAB — CBC
HCT: 35.1 % — ABNORMAL LOW (ref 36.0–46.0)
MCH: 30.4 pg (ref 26.0–34.0)
MCHC: 33.9 g/dL (ref 30.0–36.0)
MCV: 89.5 fL (ref 78.0–100.0)
Platelets: 222 10*3/uL (ref 150–400)
RDW: 13.1 % (ref 11.5–15.5)
WBC: 11.6 10*3/uL — ABNORMAL HIGH (ref 4.0–10.5)

## 2012-10-15 MED ORDER — LACTATED RINGERS IV SOLN
500.0000 mL | Freq: Once | INTRAVENOUS | Status: AC
  Administered 2012-10-15: 500 mL via INTRAVENOUS

## 2012-10-15 MED ORDER — OXYCODONE-ACETAMINOPHEN 5-325 MG PO TABS
1.0000 | ORAL_TABLET | ORAL | Status: DC | PRN
Start: 2012-10-15 — End: 2012-10-16

## 2012-10-15 MED ORDER — FENTANYL 2.5 MCG/ML BUPIVACAINE 1/10 % EPIDURAL INFUSION (WH - ANES)
14.0000 mL/h | INTRAMUSCULAR | Status: DC | PRN
  Administered 2012-10-15: 14 mL/h via EPIDURAL
  Filled 2012-10-15: qty 125

## 2012-10-15 MED ORDER — LACTATED RINGERS IV SOLN: 500.0000 mL | INTRAVENOUS | Status: DC | PRN

## 2012-10-15 MED ORDER — PHENYLEPHRINE 40 MCG/ML (10ML) SYRINGE FOR IV PUSH (FOR BLOOD PRESSURE SUPPORT)
80.0000 ug | PREFILLED_SYRINGE | INTRAVENOUS | Status: DC | PRN
  Filled 2012-10-15: qty 2

## 2012-10-15 MED ORDER — OXYTOCIN 40 UNITS IN LACTATED RINGERS INFUSION - SIMPLE MED
1.0000 m[IU]/min | INTRAVENOUS | Status: DC
  Administered 2012-10-15: 2 m[IU]/min via INTRAVENOUS
  Filled 2012-10-15: qty 1000

## 2012-10-15 MED ORDER — IBUPROFEN 600 MG PO TABS: 600.0000 mg | ORAL_TABLET | Freq: Four times a day (QID) | ORAL | Status: DC | PRN

## 2012-10-15 MED ORDER — OXYTOCIN BOLUS FROM INFUSION: 500.0000 mL | INTRAVENOUS | Status: DC

## 2012-10-15 MED ORDER — LACTATED RINGERS IV SOLN
INTRAVENOUS | Status: DC
  Administered 2012-10-15 – 2012-10-16 (×2): via INTRAVENOUS

## 2012-10-15 MED ORDER — FENTANYL CITRATE 0.05 MG/ML IJ SOLN
100.0000 ug | INTRAMUSCULAR | Status: DC | PRN
  Administered 2012-10-15: 100 ug via INTRAVENOUS
  Filled 2012-10-15: qty 2

## 2012-10-15 MED ORDER — EPHEDRINE 5 MG/ML INJ
10.0000 mg | INTRAVENOUS | Status: DC | PRN
  Filled 2012-10-15: qty 2

## 2012-10-15 MED ORDER — CITRIC ACID-SODIUM CITRATE 334-500 MG/5ML PO SOLN
30.0000 mL | ORAL | Status: DC | PRN
  Administered 2012-10-15: 30 mL via ORAL
  Filled 2012-10-15: qty 15

## 2012-10-15 MED ORDER — ONDANSETRON HCL 4 MG/2ML IJ SOLN
4.0000 mg | Freq: Four times a day (QID) | INTRAMUSCULAR | Status: DC | PRN
  Administered 2012-10-15: 4 mg via INTRAVENOUS
  Filled 2012-10-15: qty 2

## 2012-10-15 MED ORDER — DIPHENHYDRAMINE HCL 50 MG/ML IJ SOLN: 12.5000 mg | INTRAMUSCULAR | Status: DC | PRN

## 2012-10-15 MED ORDER — OXYTOCIN 40 UNITS IN LACTATED RINGERS INFUSION - SIMPLE MED: 62.5000 mL/h | INTRAVENOUS | Status: DC

## 2012-10-15 MED ORDER — EPHEDRINE 5 MG/ML INJ
10.0000 mg | INTRAVENOUS | Status: DC | PRN
  Filled 2012-10-15: qty 4
  Filled 2012-10-15: qty 2

## 2012-10-15 MED ORDER — TERBUTALINE SULFATE 1 MG/ML IJ SOLN: 0.2500 mg | Freq: Once | INTRAMUSCULAR | Status: AC | PRN

## 2012-10-15 MED ORDER — PHENYLEPHRINE 40 MCG/ML (10ML) SYRINGE FOR IV PUSH (FOR BLOOD PRESSURE SUPPORT)
80.0000 ug | PREFILLED_SYRINGE | INTRAVENOUS | Status: DC | PRN
  Filled 2012-10-15: qty 5
  Filled 2012-10-15: qty 2

## 2012-10-15 MED ORDER — LIDOCAINE HCL (PF) 1 % IJ SOLN
INTRAMUSCULAR | Status: DC | PRN
  Administered 2012-10-15 (×4): 4 mL

## 2012-10-15 MED ORDER — ACETAMINOPHEN 325 MG PO TABS: 650.0000 mg | ORAL_TABLET | ORAL | Status: DC | PRN

## 2012-10-15 MED ORDER — LIDOCAINE HCL (PF) 1 % IJ SOLN
30.0000 mL | INTRAMUSCULAR | Status: DC | PRN
  Filled 2012-10-15: qty 30

## 2012-10-15 NOTE — MAU Provider Note (Signed)
Chief Complaint:  Labor Eval   First Provider Initiated Contact with Patient 10/15/12 1417     Past Medical History: Past Medical History  Diagnosis Date  . Ovarian cyst   . No pertinent past medical history     Past obstetric history: OB History  Gravida Para Term Preterm AB SAB TAB Ectopic Multiple Living  5 3 3  1     3     # Outcome Date GA Lbr Len/2nd Weight Sex Delivery Anes PTL Lv  5 CUR           4 TRM 02/23/11 [redacted]w[redacted]d / 00:38 7 lb 1.2 oz (3.21 kg) M SVD EPI  Y  3 TRM 04/15/08 [redacted]w[redacted]d   F SVD EPI  Y  2 TRM 12/2003 [redacted]w[redacted]d  6 lb 13 oz (3.09 kg) M CS EPI  Y  1 ABT               Past Surgical History: Past Surgical History  Procedure Laterality Date  . Cesarean section      first was c-section; second vbac  . Cesarean section  December 28, 2003     Family History: Family History  Problem Relation Age of Onset  . Endometriosis Mother     Social History: History  Substance Use Topics  . Smoking status: Current Some Day Smoker -- 0.50 packs/day    Types: Cigarettes  . Smokeless tobacco: Never Used  . Alcohol Use: No    Allergies:  Allergies  Allergen Reactions  . Amoxicillin Other (See Comments)    Patient believes that her reaction may be a rash, but not sure.  Her mom told her that she had a reaction to this medication.  Marland Kitchen Penicillins Rash    Meds:  Prescriptions prior to admission  Medication Sig Dispense Refill  . calcium carbonate (TUMS - DOSED IN MG ELEMENTAL CALCIUM) 500 MG chewable tablet Chew 2 tablets by mouth at bedtime as needed for heartburn.      . diphenhydrAMINE (BENADRYL) 25 MG tablet Take 25 mg by mouth at bedtime as needed for sleep.      . Prenatal Vit-Fe Fumarate-FA (PRENATAL COMPLETE PO) Take 1 tablet by mouth daily.         ROS: Pertinent findings in history of present illness.  Physical Exam  Blood pressure 113/72, pulse 88, temperature 97.9 F (36.6 C), temperature source Oral, resp. rate 18, height 5\' 8"  (1.727 m), weight 171 lb  6.4 oz (77.747 kg), last menstrual period 08/01/2011. GENERAL: Well-developed, well-nourished female in no acute distress.  HEENT: normocephalic HEART: normal rate RESP: normal effort ABDOMEN: Soft, non-tender, gravid appropriate for gestational age EXTREMITIES: Nontender, no edema NEURO: alert and oriented SPECULUM EXAM: NEFG, pooling small amount fluid  no blood   Dilation: 3 Effacement (%): 70 Cervical Position: Posterior Presentation: Vertex Exam by:: D. Wheeler Incorvaia CNM FHT:  Baseline 140 , moderate variability, accelerations present, no decelerations Contractions: occasional   Labs: No results found for this or any previous visit (from the past 24 hour(s)). Fern pos Imaging:  No results found.  MAU Course:   Assessment: G5 P3 O13 at 40 weeks with PROM, not in labor Fetal heart rate reactive  Plan: Admit   Danae Orleans, CNM 10/15/2012 2:19 PM

## 2012-10-15 NOTE — MAU Note (Signed)
Pt C/O ? Sinus infection.  Had large amount of discharge this a.m., bloody mucus.  Has intermittent uc's.  Denies frank bleeding or LOF.  Decreased FM.

## 2012-10-15 NOTE — Progress Notes (Addendum)
Patient ID: SHARREN SCHNURR, female   DOB: Apr 03, 1985, 27 y.o.   MRN: [id number] MYAN LOCATELLI is a 27 y.o. [id number] at [redacted]w[redacted]d admitted for IOL indicated by SROM  Subjective: Requests epidural due to UCs stronger. Had some relief from Fentanyl.   Objective: BP 113/67  Pulse 66  Temp(Src) 98.7 F (37.1 C) (Oral)  Resp 18  Ht 5\' 8"  (1.727 m)  Wt 171 lb 6.4 oz (77.747 kg)  BMI 26.07 kg/m2  LMP 08/01/2011  Fetal Heart FHR: 135 bpm, variability: moderate,  accelerations:  Present,  decelerations:  Absent   Contractions: q 2-3 Pitocin at 4 mu  SVE:   Dilation: 3 Effacement (%): 70 Station: -3 Exam by:: L. CResenzo, RN  Assessment / Plan:  Labor: SROM, early labor; anxiety> decliines IV pain med, will proceed with epidural Fetal Wellbeing: Cat 1 Pain Control:  inadequate Expected mode of delivery: NSVD  Kyndall Amero 10/15/2012, 7:08 PM

## 2012-10-15 NOTE — Anesthesia Preprocedure Evaluation (Signed)
Anesthesia Evaluation  Patient identified by MRN, date of birth, ID band Patient awake    Reviewed: Allergy & Precautions, H&P , NPO status , Patient's Chart, lab work & pertinent test results, reviewed documented beta blocker date and time   History of Anesthesia Complications Negative for: history of anesthetic complications  Airway Mallampati: II TM Distance: >3 FB Neck ROM: full    Dental  (+) Teeth Intact   Pulmonary Current Smoker,  breath sounds clear to auscultation        Cardiovascular negative cardio ROS  Rhythm:regular Rate:Normal     Neuro/Psych negative neurological ROS  negative psych ROS   GI/Hepatic negative GI ROS, Neg liver ROS,   Endo/Other  negative endocrine ROS  Renal/GU negative Renal ROS     Musculoskeletal   Abdominal   Peds  Hematology negative hematology ROS (+)   Anesthesia Other Findings   Reproductive/Obstetrics (+) Pregnancy (h/o c/s x1, h/o VBAC x2, TOLAC)                           Anesthesia Physical Anesthesia Plan  ASA: II  Anesthesia Plan: Epidural   Post-op Pain Management:    Induction:   Airway Management Planned:   Additional Equipment:   Intra-op Plan:   Post-operative Plan:   Informed Consent: I have reviewed the patients History and Physical, chart, labs and discussed the procedure including the risks, benefits and alternatives for the proposed anesthesia with the patient or authorized representative who has indicated his/her understanding and acceptance.     Plan Discussed with:   Anesthesia Plan Comments:         Anesthesia Quick Evaluation

## 2012-10-15 NOTE — Anesthesia Procedure Notes (Signed)
Epidural Patient location during procedure: OB Start time: 10/15/2012 7:40 PM  Staffing Performed by: anesthesiologist   Preanesthetic Checklist Completed: patient identified, site marked, surgical consent, pre-op evaluation, timeout performed, IV checked, risks and benefits discussed and monitors and equipment checked  Epidural Patient position: sitting Prep: site prepped and draped and DuraPrep Patient monitoring: continuous pulse ox and blood pressure Approach: midline Injection technique: LOR air  Needle:  Needle type: Tuohy  Needle gauge: 17 G Needle length: 9 cm and 9 Needle insertion depth: 5 cm cm Catheter type: closed end flexible Catheter size: 19 Gauge Catheter at skin depth: 10 cm Test dose: negative  Assessment Events: blood not aspirated, injection not painful, no injection resistance, negative IV test and no paresthesia  Additional Notes Discussed risk of headache, infection, bleeding, nerve injury and failed or incomplete block.  Patient voices understanding and wishes to proceed.  Epidural placed easily on first attempt.  No paresthesia.  Patient tolerated procedure well with no apparent complications.  Jasmine December, MDReason for block:procedure for pain

## 2012-10-15 NOTE — H&P (Signed)
HPI: Monique Lyons is a 27 y.o. [phone] at [redacted]w[redacted]d who presents to maternity admissions reporting spurt of watery clear fluid 7:30 am. Also had blood tinged mucus. Cx was 3 cm at [facility] 2 days ago. Occasional mild UCs. Denies contractions, leakage of fluid or vaginal bleeding. Good fetal movement.   Pregnancy Course: essentially uncomplicated at [facility]   Maternal Medical History:  Reason for admission: Rupture of membranes.  Nausea. At 0730 clear, sm amt  Contractions: Onset was 1 week ago.   Frequency: irregular.   Perceived severity is mild.    Fetal activity: Perceived fetal activity is normal.   Last perceived fetal movement was within the past hour.    Prenatal complications: No bleeding.   Prenatal Complications - Diabetes: none.    OB History   Grav Para Term Preterm Abortions TAB SAB Ect Mult Living   5 3 3  1     3      Past Medical History  Diagnosis Date  . Ovarian cyst   . No pertinent past medical history    Past Surgical History  Procedure Laterality Date  . Cesarean section      first was c-section; second vbac  . Cesarean section  December 28, 2003   Family History: family history includes Endometriosis in her mother. Social History:  reports that she has been smoking Cigarettes.  She has been smoking about 0.50 packs per day. She has never used smokeless tobacco. She reports that she does not drink alcohol or use illicit drugs.   Prenatal Transfer Tool  Maternal Diabetes: No Genetic Screening: Declined Maternal Ultrasounds/Referrals: Normal Fetal Ultrasounds or other Referrals:  None Maternal Substance Abuse:  No Significant Maternal Medications:  None Significant Maternal Lab Results:  Lab values include: Group B Strep negative Other Comments:  None  Review of Systems  Constitutional: Negative for fever.  HENT: Positive for congestion.   Respiratory: Positive for cough. Negative for sputum production and shortness of breath.   Cardiovascular:  Negative for chest pain.  Gastrointestinal: Negative for nausea and vomiting.  Genitourinary: Negative for dysuria.  Skin: Negative for rash.  Neurological: Negative for headaches.  Psychiatric/Behavioral: The patient is nervous/anxious.        Anxiety. Hx panic attack    Dilation: 3 Effacement (%): 70 Exam by:: D. Talik Casique CNM Blood pressure 113/72, pulse 88, temperature 97.9 F (36.6 C), temperature source Oral, resp. rate 18, height 5\' 8"  (1.727 m), weight 171 lb 6.4 oz (77.747 kg), last menstrual period 08/01/2011. Maternal Exam:  Uterine Assessment: Contraction strength is mild.  Contraction frequency is rare.   Abdomen: Patient reports no abdominal tenderness. Estimated fetal weight is 7#.    Introitus: Normal vulva. Ferning test: positive.  Nitrazine test: not done. Amniotic fluid character: clear.  Pelvis: adequate for delivery.   Cervix: Cervix evaluated by digital exam.     Fetal Exam Fetal Monitor Review: Mode: ultrasound.   Baseline rate: 140.  Variability: moderate (6-25 bpm).   Pattern: accelerations present and no decelerations.    Fetal State Assessment: Category I - tracings are normal.     Physical Exam  Prenatal labs: ABO, Rh: O/POS/-- (03/25 0943) Antibody: NEG (03/25 0943) Rubella: 2.98 (03/25 0943) RPR: NON REAC (06/25 1239)  HBsAg: NEGATIVE (03/25 0943)  HIV: NON REACTIVE (06/25 1239)  GBS:   neg 1 hr Gluc 81  Assessment/Plan: Multip at 40 wks with PROM, favorable cx> admit, pitocin IOL Category 1 FHR   Monique Lyons 10/15/2012, 2:47 PM

## 2012-10-16 ENCOUNTER — Encounter (HOSPITAL_COMMUNITY): Payer: Self-pay | Admitting: *Deleted

## 2012-10-16 DIAGNOSIS — O4290 Premature rupture of membranes, unspecified as to length of time between rupture and onset of labor, unspecified weeks of gestation: Secondary | ICD-10-CM

## 2012-10-16 DIAGNOSIS — O0940 Supervision of pregnancy with grand multiparity, unspecified trimester: Secondary | ICD-10-CM

## 2012-10-16 DIAGNOSIS — O34219 Maternal care for unspecified type scar from previous cesarean delivery: Secondary | ICD-10-CM

## 2012-10-16 DIAGNOSIS — O368190 Decreased fetal movements, unspecified trimester, not applicable or unspecified: Secondary | ICD-10-CM

## 2012-10-16 LAB — CBC
Hemoglobin: 10.6 g/dL — ABNORMAL LOW (ref 12.0–15.0)
MCH: 31.1 pg (ref 26.0–34.0)
MCV: 90.6 fL (ref 78.0–100.0)
RBC: 3.41 MIL/uL — ABNORMAL LOW (ref 3.87–5.11)
WBC: 11.6 10*3/uL — ABNORMAL HIGH (ref 4.0–10.5)

## 2012-10-16 MED ORDER — METOCLOPRAMIDE HCL 10 MG PO TABS
10.0000 mg | ORAL_TABLET | Freq: Once | ORAL | Status: AC
  Administered 2012-10-16: 10 mg via ORAL
  Filled 2012-10-16: qty 1

## 2012-10-16 MED ORDER — DIBUCAINE 1 % RE OINT: 1.0000 "application " | TOPICAL_OINTMENT | RECTAL | Status: DC | PRN

## 2012-10-16 MED ORDER — DIPHENHYDRAMINE HCL 25 MG PO CAPS: 25.0000 mg | ORAL_CAPSULE | Freq: Four times a day (QID) | ORAL | Status: DC | PRN

## 2012-10-16 MED ORDER — LACTATED RINGERS IV SOLN
INTRAVENOUS | Status: DC
  Administered 2012-10-16: 07:00:00 via INTRAVENOUS

## 2012-10-16 MED ORDER — SENNOSIDES-DOCUSATE SODIUM 8.6-50 MG PO TABS
2.0000 | ORAL_TABLET | ORAL | Status: DC
  Administered 2012-10-16 – 2012-10-17 (×2): 2 via ORAL

## 2012-10-16 MED ORDER — ONDANSETRON HCL 4 MG/2ML IJ SOLN: 4.0000 mg | INTRAMUSCULAR | Status: DC | PRN

## 2012-10-16 MED ORDER — KETOROLAC TROMETHAMINE 30 MG/ML IJ SOLN
INTRAMUSCULAR | Status: AC
  Filled 2012-10-16: qty 1

## 2012-10-16 MED ORDER — MIDAZOLAM HCL 2 MG/2ML IJ SOLN
INTRAMUSCULAR | Status: AC
  Filled 2012-10-16: qty 2

## 2012-10-16 MED ORDER — PRAMOXINE HCL 1 % RE FOAM
Freq: Three times a day (TID) | RECTAL | Status: DC | PRN
  Filled 2012-10-16: qty 15

## 2012-10-16 MED ORDER — OXYCODONE-ACETAMINOPHEN 5-325 MG PO TABS
1.0000 | ORAL_TABLET | ORAL | Status: DC | PRN
  Administered 2012-10-16: 2 via ORAL
  Administered 2012-10-16 (×3): 1 via ORAL
  Administered 2012-10-17: 2 via ORAL
  Administered 2012-10-17: 1 via ORAL
  Administered 2012-10-17: 2 via ORAL
  Administered 2012-10-17 – 2012-10-18 (×2): 1 via ORAL
  Filled 2012-10-16: qty 1
  Filled 2012-10-16: qty 2
  Filled 2012-10-16 (×5): qty 1
  Filled 2012-10-16 (×2): qty 2

## 2012-10-16 MED ORDER — WITCH HAZEL-GLYCERIN EX PADS: 1.0000 "application " | MEDICATED_PAD | CUTANEOUS | Status: DC | PRN

## 2012-10-16 MED ORDER — ONDANSETRON HCL 4 MG PO TABS: 4.0000 mg | ORAL_TABLET | ORAL | Status: DC | PRN

## 2012-10-16 MED ORDER — TETANUS-DIPHTH-ACELL PERTUSSIS 5-2.5-18.5 LF-MCG/0.5 IM SUSP: 0.5000 mL | Freq: Once | INTRAMUSCULAR | Status: DC

## 2012-10-16 MED ORDER — BENZOCAINE-MENTHOL 20-0.5 % EX AERO
1.0000 "application " | INHALATION_SPRAY | CUTANEOUS | Status: DC | PRN
  Administered 2012-10-16: 1 via TOPICAL
  Filled 2012-10-16: qty 56

## 2012-10-16 MED ORDER — LANOLIN HYDROUS EX OINT: TOPICAL_OINTMENT | CUTANEOUS | Status: DC | PRN

## 2012-10-16 MED ORDER — ZOLPIDEM TARTRATE 5 MG PO TABS: 5.0000 mg | ORAL_TABLET | Freq: Every evening | ORAL | Status: DC | PRN

## 2012-10-16 MED ORDER — PRENATAL MULTIVITAMIN CH
1.0000 | ORAL_TABLET | Freq: Every day | ORAL | Status: DC
  Administered 2012-10-16: 1 via ORAL
  Filled 2012-10-16 (×2): qty 1

## 2012-10-16 MED ORDER — FENTANYL CITRATE 0.05 MG/ML IJ SOLN
INTRAMUSCULAR | Status: AC
  Filled 2012-10-16: qty 5

## 2012-10-16 MED ORDER — FAMOTIDINE 20 MG PO TABS
40.0000 mg | ORAL_TABLET | Freq: Once | ORAL | Status: AC
  Administered 2012-10-16: 40 mg via ORAL
  Filled 2012-10-16: qty 2

## 2012-10-16 MED ORDER — IBUPROFEN 600 MG PO TABS
600.0000 mg | ORAL_TABLET | Freq: Four times a day (QID) | ORAL | Status: DC
  Administered 2012-10-16 – 2012-10-18 (×9): 600 mg via ORAL
  Filled 2012-10-16 (×11): qty 1

## 2012-10-16 MED ORDER — SIMETHICONE 80 MG PO CHEW
80.0000 mg | CHEWABLE_TABLET | ORAL | Status: DC | PRN
  Administered 2012-10-17: 80 mg via ORAL

## 2012-10-16 NOTE — Progress Notes (Signed)
Notified Dr. Jean Rosenthal that pt. Wanted to keep epidural until surgery to prevent being stuck again. Dr. Jean Rosenthal notified and wanted this nurse to stress to pt. If she has pain to that site to notify staff to prevent infection.

## 2012-10-16 NOTE — Lactation Note (Signed)
This note was copied from the chart of Monique Kaylanie Capili. Lactation Consultation Note Mom states breast feeding is going well; intents to breast and formula feed baby. Mom states she breast fed 2 of her other children for 2 weeks and stopped because of a recommendation from doctor while taking allergy meds. Br feeding basics reviewed; baby now sound asleep in bassinet. Offered to assist, enc mom to call for help if she has any concerns. Reviewed baby and me book; lactation brochure; community resources; BFGS. Mom states she does not have any questions or concerns at this time.  Patient Name: Monique Lyons [id number] Date: 10/16/2012 Reason for consult: Initial assessment   Maternal Data Formula Feeding for Exclusion: Yes Reason for exclusion: Mother's choice to formula and breast feed on admission Does the patient have breastfeeding experience prior to this delivery?: Yes  Feeding    LATCH Score/Interventions                      Lactation Tools Discussed/Used     Consult Status Consult Status: Follow-up Follow-up type: In-patient    Octavio Manns [facility] 10/16/2012, 12:02 PM

## 2012-10-16 NOTE — Progress Notes (Signed)
Notified Dr. Jean Rosenthal in reguards of postponed BTL until 9/14 am. Stated to get epidural removed and would do spinal in am. Since pt. Is having discomfort to back. L&D notified to come remove epidural.

## 2012-10-16 NOTE — Anesthesia Postprocedure Evaluation (Signed)
  Anesthesia Post-op Note  Patient: Monique Lyons  Procedure(s) Performed: * No procedures listed *  Patient Location: Mother/Baby  Anesthesia Type:Epidural  Level of Consciousness: awake and alert   Airway and Oxygen Therapy: Patient Spontanous Breathing  Post-op Pain: mild  Post-op Assessment: Patient's Cardiovascular Status Stable, Respiratory Function Stable, No signs of Nausea or vomiting, Pain level controlled, No headache, No residual numbness and No residual motor weakness  Post-op Vital Signs: stable  Complications: No apparent anesthesia complications

## 2012-10-16 NOTE — Progress Notes (Signed)
Faculty Practice OB/GYN Attending Note  Patient desires permanent sterilization.  Other long term reversible forms of contraception were discussed with patient; including Skyla, Mirena and Paragard IUDs, Nexplanon, Depo Provera etc.  Also discussed vasectomy for her female partner.  Risks and efficacy of all modalities discussed in detail.  Risks of  PPBTL discussed with patient including but not limited to: risk of regret, permanence of method, bleeding, infection, injury to surrounding organs and need for additional procedures.  Failure risk of 1-2 % with increased risk of ectopic gestation if pregnancy occurs was also discussed with patient.  Patient verbalized understanding of these risks, and wants to consider her options. She is deciding between IUD and PPBTL; wants BTL moved to tomorrow.  OR notified of patient's decision, procedure booked for 0900 on 10/18/12 with Dr. Despina Hidden.    Will continue routine postpartum care, NPO after midnight.  Jaynie Collins, MD, FACOG Attending Obstetrician & Gynecologist Faculty Practice, [facility]

## 2012-10-16 NOTE — Anesthesia Preprocedure Evaluation (Signed)
Anesthesia Evaluation  Patient identified by MRN, date of birth, ID band Patient awake    Reviewed: Allergy & Precautions, H&P , NPO status , Patient's Chart, lab work & pertinent test results, reviewed documented beta blocker date and time   History of Anesthesia Complications Negative for: history of anesthetic complications  Airway Mallampati: II TM Distance: >3 FB Neck ROM: full    Dental  (+) Teeth Intact   Pulmonary Current Smoker,  breath sounds clear to auscultation        Cardiovascular negative cardio ROS  Rhythm:regular Rate:Normal     Neuro/Psych negative neurological ROS  negative psych ROS   GI/Hepatic negative GI ROS, Neg liver ROS,   Endo/Other  negative endocrine ROS  Renal/GU negative Renal ROS     Musculoskeletal   Abdominal   Peds  Hematology negative hematology ROS (+)   Anesthesia Other Findings   Reproductive/Obstetrics (+) Pregnancy (h/o c/s x1, h/o VBAC x2, TOLAC)                           Anesthesia Physical Anesthesia Plan  ASA: II  Anesthesia Plan: Epidural   Post-op Pain Management:    Induction:   Airway Management Planned:   Additional Equipment:   Intra-op Plan:   Post-operative Plan:   Informed Consent: I have reviewed the patients History and Physical, chart, labs and discussed the procedure including the risks, benefits and alternatives for the proposed anesthesia with the patient or authorized representative who has indicated his/her understanding and acceptance.     Plan Discussed with:   Anesthesia Plan Comments:         Anesthesia Quick Evaluation  

## 2012-10-17 ENCOUNTER — Encounter (HOSPITAL_COMMUNITY)
Admission: AD | Disposition: A | Discharge status: Home / Self Care | Payer: Self-pay | Source: Ambulatory Visit | Attending: Obstetrics & Gynecology

## 2012-10-17 ENCOUNTER — Inpatient Hospital Stay (HOSPITAL_COMMUNITY): Payer: Medicaid Other | Admitting: Anesthesiology

## 2012-10-17 ENCOUNTER — Encounter (HOSPITAL_COMMUNITY): Payer: Self-pay | Admitting: Anesthesiology

## 2012-10-17 DIAGNOSIS — Z302 Encounter for sterilization: Secondary | ICD-10-CM

## 2012-10-17 HISTORY — PX: TUBAL LIGATION: SHX77

## 2012-10-17 SURGERY — LIGATION, FALLOPIAN TUBE, POSTPARTUM
Anesthesia: Epidural | Site: Abdomen | Laterality: Bilateral | Wound class: Clean Contaminated

## 2012-10-17 MED ORDER — FAMOTIDINE 20 MG PO TABS
40.0000 mg | ORAL_TABLET | Freq: Once | ORAL | Status: AC
  Administered 2012-10-17: 40 mg via ORAL
  Filled 2012-10-17: qty 2

## 2012-10-17 MED ORDER — LIDOCAINE-EPINEPHRINE (PF) 2 %-1:200000 IJ SOLN
INTRAMUSCULAR | Status: AC
  Filled 2012-10-17: qty 20

## 2012-10-17 MED ORDER — SODIUM BICARBONATE 8.4 % IV SOLN
INTRAVENOUS | Status: DC | PRN
  Administered 2012-10-17: 10 mL via EPIDURAL
  Administered 2012-10-17 (×2): 3 mL via EPIDURAL
  Administered 2012-10-17: 2 mL via EPIDURAL
  Administered 2012-10-17: 5 mL via EPIDURAL

## 2012-10-17 MED ORDER — INFLUENZA VAC SPLIT QUAD 0.5 ML IM SUSP
0.5000 mL | INTRAMUSCULAR | Status: AC
  Administered 2012-10-18: 0.5 mL via INTRAMUSCULAR

## 2012-10-17 MED ORDER — BUPIVACAINE HCL (PF) 0.5 % IJ SOLN
INTRAMUSCULAR | Status: DC | PRN
  Administered 2012-10-17: 10 mL

## 2012-10-17 MED ORDER — METOCLOPRAMIDE HCL 10 MG PO TABS
10.0000 mg | ORAL_TABLET | Freq: Once | ORAL | Status: AC
  Administered 2012-10-17: 10 mg via ORAL
  Filled 2012-10-17: qty 1

## 2012-10-17 MED ORDER — MIDAZOLAM HCL 2 MG/2ML IJ SOLN
INTRAMUSCULAR | Status: AC
  Filled 2012-10-17: qty 2

## 2012-10-17 MED ORDER — FENTANYL CITRATE 0.05 MG/ML IJ SOLN
INTRAMUSCULAR | Status: AC
  Administered 2012-10-17: 50 ug via INTRAVENOUS
  Filled 2012-10-17: qty 2

## 2012-10-17 MED ORDER — MIDAZOLAM HCL 5 MG/5ML IJ SOLN
INTRAMUSCULAR | Status: DC | PRN
  Administered 2012-10-17 (×2): 1 mg via INTRAVENOUS

## 2012-10-17 MED ORDER — SODIUM BICARBONATE 8.4 % IV SOLN
INTRAVENOUS | Status: AC
  Filled 2012-10-17: qty 50

## 2012-10-17 MED ORDER — FENTANYL CITRATE 0.05 MG/ML IJ SOLN
INTRAMUSCULAR | Status: DC | PRN
Start: 2012-10-17 — End: 2012-10-17
  Administered 2012-10-17 (×2): 50 ug via INTRAVENOUS

## 2012-10-17 MED ORDER — LACTATED RINGERS IV SOLN
INTRAVENOUS | Status: DC
  Administered 2012-10-17 (×2): via INTRAVENOUS

## 2012-10-17 MED ORDER — ONDANSETRON HCL 4 MG/2ML IJ SOLN
INTRAMUSCULAR | Status: AC
  Filled 2012-10-17: qty 2

## 2012-10-17 MED ORDER — FENTANYL CITRATE 0.05 MG/ML IJ SOLN
INTRAMUSCULAR | Status: AC
  Filled 2012-10-17: qty 2

## 2012-10-17 MED ORDER — ONDANSETRON HCL 4 MG/2ML IJ SOLN
INTRAMUSCULAR | Status: DC | PRN
  Administered 2012-10-17: 4 mg via INTRAVENOUS

## 2012-10-17 MED ORDER — FENTANYL CITRATE 0.05 MG/ML IJ SOLN
25.0000 ug | INTRAMUSCULAR | Status: DC | PRN
  Administered 2012-10-17 (×2): 50 ug via INTRAVENOUS

## 2012-10-17 MED ORDER — 0.9 % SODIUM CHLORIDE (POUR BTL) OPTIME
TOPICAL | Status: DC | PRN
  Administered 2012-10-17: 1000 mL

## 2012-10-17 MED ORDER — PNEUMOCOCCAL VAC POLYVALENT 25 MCG/0.5ML IJ INJ
0.5000 mL | INJECTION | INTRAMUSCULAR | Status: AC
  Administered 2012-10-18: 0.5 mL via INTRAMUSCULAR
  Filled 2012-10-17 (×2): qty 0.5

## 2012-10-17 MED ORDER — BUPIVACAINE HCL (PF) 0.25 % IJ SOLN
INTRAMUSCULAR | Status: AC
  Filled 2012-10-17: qty 30

## 2012-10-17 SURGICAL SUPPLY — 28 items
ADH SKN CLS LQ APL DERMABOND (GAUZE/BANDAGES/DRESSINGS) ×1
APL SKNCLS STERI-STRIP NONHPOA (GAUZE/BANDAGES/DRESSINGS)
BENZOIN TINCTURE PRP APPL 2/3 (GAUZE/BANDAGES/DRESSINGS) IMPLANT
CHLORAPREP W/TINT 26ML (MISCELLANEOUS) ×2 IMPLANT
CLOTH BEACON ORANGE TIMEOUT ST (SAFETY) ×2 IMPLANT
DERMABOND ADHESIVE PROPEN (GAUZE/BANDAGES/DRESSINGS) ×1
DERMABOND ADVANCED .7 DNX6 (GAUZE/BANDAGES/DRESSINGS) ×1 IMPLANT
DRSG COVADERM PLUS 2X2 (GAUZE/BANDAGES/DRESSINGS) ×2 IMPLANT
ELECT REM PT RETURN 9FT ADLT (ELECTROSURGICAL) ×2
ELECTRODE REM PT RTRN 9FT ADLT (ELECTROSURGICAL) ×1 IMPLANT
GLOVE BIO SURGEON STRL SZ7 (GLOVE) ×2 IMPLANT
GLOVE BIOGEL PI IND STRL 7.0 (GLOVE) ×2 IMPLANT
GLOVE BIOGEL PI INDICATOR 7.0 (GLOVE) ×2
GOWN PREVENTION PLUS LG XLONG (DISPOSABLE) ×4 IMPLANT
NEEDLE HYPO 25X1 1.5 SAFETY (NEEDLE) ×2 IMPLANT
NS IRRIG 1000ML POUR BTL (IV SOLUTION) ×2 IMPLANT
PACK ABDOMINAL MINOR (CUSTOM PROCEDURE TRAY) ×2 IMPLANT
PENCIL BUTTON HOLSTER BLD 10FT (ELECTRODE) ×2 IMPLANT
SPONGE LAP 4X18 X RAY DECT (DISPOSABLE) ×2 IMPLANT
STRIP CLOSURE SKIN 1/2X4 (GAUZE/BANDAGES/DRESSINGS) IMPLANT
SUT CHROMIC 2 0 TIES 18 (SUTURE) IMPLANT
SUT VIC AB 0 CT1 27 (SUTURE) ×2
SUT VIC AB 0 CT1 27XBRD ANBCTR (SUTURE) ×1 IMPLANT
SUT VICRYL 4-0 PS2 18IN ABS (SUTURE) ×2 IMPLANT
SYR CONTROL 10ML LL (SYRINGE) ×2 IMPLANT
TOWEL OR 17X24 6PK STRL BLUE (TOWEL DISPOSABLE) ×4 IMPLANT
TRAY FOLEY CATH 14FR (SET/KITS/TRAYS/PACK) ×2 IMPLANT
WATER STERILE IRR 1000ML POUR (IV SOLUTION) IMPLANT

## 2012-10-17 NOTE — Transfer of Care (Signed)
Immediate Anesthesia Transfer of Care Note  Patient: Monique Lyons  Procedure(s) Performed: Procedure(s): POST PARTUM TUBAL LIGATION bilateral (Bilateral)  Patient Location: PACU  Anesthesia Type:Epidural  Level of Consciousness: awake and alert   Airway & Oxygen Therapy: Patient Spontanous Breathing  Post-op Assessment: Report given to PACU RN  Post vital signs: stable  Complications: No apparent anesthesia complications

## 2012-10-17 NOTE — Op Note (Signed)
Preoperative diagnosis:  Multiparous female who desires permanent sterilization  Postoperative diagnosis:  Same as above  Procedure:  Postpartum partum bilateral tubal ligation using modified Pomeroy technique  Surgeon:  Duane Lope H  Asst.:  Dr. Rulon Abide  Anesthesia:  Spinal  Findings:  Patient had a normal postpartum uterus tubes and ovaries.  Description of operation:  Patient was taken to the operating room where she was placed in the sitting position and her epidural was dosed to spinal levels.  She was then placed in the supine position.  She was then prepped and draped in the usual sterile fashion and a Foley catheter was placed in the bladder after the spinal was dosed.  A semilunar incision was made just below the umbilicus and taken down sharply to the fascia which was incised.  The peritoneum was then entered manually.  The right fallopian tube was identified including the fimbriated end.  The right fallopian tube was grasped and a 2-1/2 cm segment was removed using the modified Pomeroy technique.  There was good hemostasis.  Attention was then turned to the left fallopian tube which was identified including the fimbriated end.  A 2-1/2 cm segment in the distal isthmic and ampullary region portion of the tube was removed again using a modified Pomeroy technique.  There was good hemostasis.    The subcutaneous tissue fascia and peritoneum were closed using 0 vicryl in a running fashion.   The skin was closed using 4-0 Vicryl on a Keith needle in a subcuticular fashion.  Dermabond was placed for additional wound integrity and also to serve as a bandage.  The patient was taken to the recovery room in good stable condition.  All counts were correct.  Blood loss was minimal.  She received 1 g of Ancef preoperatively prophylactically.

## 2012-10-17 NOTE — Anesthesia Postprocedure Evaluation (Signed)
Anesthesia Post Note  Patient: Monique Lyons  Procedure(s) Performed: Procedure(s) (LRB): POST PARTUM TUBAL LIGATION bilateral (Bilateral)  Anesthesia type: Epidural  Patient location: PACU  Post pain: Pain level controlled  Post assessment: Post-op Vital signs reviewed  Last Vitals:  Filed Vitals:   10/17/12 1045  BP: 97/56  Pulse: 60  Temp:   Resp: 19    Post vital signs: Reviewed  Level of consciousness: awake  Complications: No apparent anesthesia complications

## 2012-10-17 NOTE — Anesthesia Postprocedure Evaluation (Signed)
  Anesthesia Post-op Note  Patient: Monique Lyons  Procedure(s) Performed: Procedure(s): POST PARTUM TUBAL LIGATION bilateral (Bilateral)  Patient Location: Mother/Baby  Anesthesia Type:Epidural  Level of Consciousness: awake and alert   Airway and Oxygen Therapy: Patient Spontanous Breathing  Post-op Pain: mild  Post-op Assessment: Post-op Vital signs reviewed, Respiratory Function Stable, No signs of Nausea or vomiting, Pain level controlled, No headache, No residual numbness and No residual motor weakness  Post-op Vital Signs: stable  Complications: No apparent anesthesia complications

## 2012-10-17 NOTE — Progress Notes (Signed)
Post Partum Day 1 Subjective: Voiding, ambulating well.  +flatus.  Lochia and pain wnl.  Denies dizziness, lightheadedness, or sob. No complaints.  No solids since 2230, sip of water w/ med this am.  Requests BTL this am- reviewed r/b as well as alternative methods, pt wishes to proceed w/ BTL  Objective: Blood pressure 109/66, pulse 61, temperature 97.7 F (36.5 C), temperature source Oral, resp. rate 18, height 5\' 8"  (1.727 m), weight 77.747 kg (171 lb 6.4 oz), last menstrual period 08/01/2011, SpO2 98.00%, unknown if currently breastfeeding.  Physical Exam:  General: alert, cooperative and no distress Lochia: appropriate Uterine Fundus: firm Incision: n/a DVT Evaluation: No evidence of DVT seen on physical exam. Negative Homan's sign. No cords or calf tenderness. No significant calf/ankle edema.   Recent Labs  10/15/12 1500 10/16/12 0706  HGB 11.9* 10.6*  HCT 35.1* 30.9*    Assessment/Plan: Breastfeeding, for BTL @ 0900 Plan d/c tomorrow   LOS: 2 days   Marge Duncans 10/17/2012, 7:28 AM

## 2012-10-18 ENCOUNTER — Encounter (HOSPITAL_COMMUNITY): Payer: Self-pay | Admitting: Obstetrics & Gynecology

## 2012-10-18 MED ORDER — IBUPROFEN 600 MG PO TABS: 600.0000 mg | ORAL_TABLET | Freq: Four times a day (QID) | ORAL | Status: DC

## 2012-10-18 NOTE — Progress Notes (Signed)
Post discharge review completed. 

## 2012-10-18 NOTE — Discharge Summary (Signed)
Obstetric Discharge Summary Reason for Admission: rupture of membranes Prenatal Procedures: ultrasound Intrapartum Procedures: spontaneous vaginal delivery and tubal ligation Postpartum Procedures: none Complications-Operative and Postpartum: none Hemoglobin  Date Value Range Status  10/16/2012 10.6* 12.0 - 15.0 g/dL Final     HCT  Date Value Range Status  10/16/2012 30.9* 36.0 - 46.0 % Final    Physical Exam:  Filed Vitals:   10/18/12 0522  BP: 101/64  Pulse: 63  Temp: 97.8 F (36.6 C)  Resp: 18   General: alert, cooperative and appears stated age CVS:  RRR, without murmur, gallops, or rubs Lungs:  CTA bilat ABD:  +BSx4, normal Lochia: appropriate Uterine Fundus: firm Incision: no significant drainage, no signs of infections DVT Evaluation: No evidence of DVT seen on physical exam. Negative Homan's sign.  Discharge Diagnoses: Term Pregnancy-delivered  And Bilateral Tubal Ligation  Discharge Information: Date: 10/18/2012 Activity: pelvic rest Diet: routine Medications: Ibuprofen Condition: stable Instructions: refer to practice specific booklet Discharge to: home Follow-up Information   Follow up with [facility] In 4 weeks.   Contact information:   [address] [place] [address]       Newborn Data: Live born female  Birth Weight: 6 lb 0.3 oz (2731 g) APGAR: 9, 9  Home with mother.  [facility] 10/18/2012, 7:33 AM

## 2012-10-19 ENCOUNTER — Encounter: Payer: Self-pay | Admitting: Obstetrics and Gynecology

## 2012-10-19 ENCOUNTER — Encounter: Payer: Medicaid Other | Admitting: Obstetrics & Gynecology

## 2012-10-20 ENCOUNTER — Encounter: Payer: Self-pay | Admitting: *Deleted

## 2012-11-15 ENCOUNTER — Encounter: Payer: Self-pay | Admitting: Obstetrics and Gynecology

## 2012-11-15 ENCOUNTER — Ambulatory Visit (INDEPENDENT_AMBULATORY_CARE_PROVIDER_SITE_OTHER): Payer: Medicaid Other | Admitting: Obstetrics and Gynecology

## 2012-11-15 VITALS — BP 117/82 | HR 75 | Temp 97.2°F | Ht 69.0 in | Wt 162.0 lb

## 2012-11-15 DIAGNOSIS — R300 Dysuria: Secondary | ICD-10-CM

## 2012-11-15 MED ORDER — CIPROFLOXACIN HCL 500 MG PO TABS: 500.0000 mg | ORAL_TABLET | Freq: Two times a day (BID) | ORAL | Status: DC

## 2012-11-15 NOTE — Progress Notes (Signed)
  Subjective:     Monique Lyons is a 27 y.o. female who presents for a postpartum visit. She is 4 weeks postpartum following a spontaneous vaginal delivery. I have fully reviewed the prenatal and intrapartum course. The delivery was at 40 gestational weeks. Outcome: spontaneous vaginal delivery. Anesthesia: epidural. Postpartum course has been uncomplicated except stopped nursing. Baby's course has been uncomplicated. Baby is feeding by bottle. Bleeding no bleeding. Bowel function is normal. Bladder function is normal. Patient is sexually active. Contraception method is tubal ligation. Postpartum depression screening: negative. Score 6 She c/o dysuria x 2 wks with frequency and some urgency. No hematuria or sytemic sx. Also has started back on her psych med for OCD 1 wk ago and feels it is helping.    Review of Systems Pertinent items are noted in HPI.   Objective:    BP 117/82  Pulse 75  Temp(Src) 97.2 F (36.2 C) (Oral)  Ht 5\' 9"  (1.753 m)  Wt 162 lb (73.483 kg)  BMI 23.91 kg/m2  General:  alert, cooperative and no distress   Breasts:  inspection negative, no nipple discharge or bleeding, no masses or nodularity palpable  Lungs: clear to auscultation bilaterally  Heart:  regular rate and rhythm, S1, S2 normal, no murmur, click, rub or gallop  Abdomen: soft, non-tender; bowel sounds normal; no masses,  no organomegaly and 1 inch diastasis   Vulva:  normal  Vagina: normal vagina  Cervix:  L/C  Corpus: normal size, contour, position, consistency, mobility, non-tender  Adnexa:  no mass, fullness, tenderness  Rectal Exam: Not performed.        Assessment:     4 wk postpartum exam. Pap smear not done at today's visit.  OCD Presumptive UTI Plan:    1. Contraception: tubal ligation 2. UA sent. Rx Cipro 3. Follow up in: 7 months or as needed.

## 2012-11-15 NOTE — Patient Instructions (Addendum)
Postpartum Care After Vaginal Delivery After you deliver your newborn (postpartum period), the usual stay in the hospital is 24 72 hours. If there were problems with your labor or delivery, or if you have other medical problems, you might be in the hospital longer.  While you are in the hospital, you will receive help and instructions on how to care for yourself and your newborn during the postpartum period.  While you are in the hospital:  Be sure to tell your nurses if you have pain or discomfort, as well as where you feel the pain and what makes the pain worse.  If you had an incision made near your vagina (episiotomy) or if you had some tearing during delivery, the nurses may put ice packs on your episiotomy or tear. The ice packs may help to reduce the pain and swelling.  If you are breastfeeding, you may feel uncomfortable contractions of your uterus for a couple of weeks. This is normal. The contractions help your uterus get back to normal size.  It is normal to have some bleeding after delivery.  For the first 1 3 days after delivery, the flow is red and the amount may be similar to a period.  It is common for the flow to start and stop.  In the first few days, you may pass some small clots. Let your nurses know if you begin to pass large clots or your flow increases.  Do not  flush blood clots down the toilet before having the nurse look at them.  During the next 3 10 days after delivery, your flow should become more watery and pink or brown-tinged in color.  Ten to fourteen days after delivery, your flow should be a small amount of yellowish-white discharge.  The amount of your flow will decrease over the first few weeks after delivery. Your flow may stop in 6 8 weeks. Most women have had their flow stop by 12 weeks after delivery.  You should change your sanitary pads frequently.  Wash your hands thoroughly with soap and water for at least 20 seconds after changing pads, using  the toilet, or before holding or feeding your newborn.  You should feel like you need to empty your bladder within the first 6 8 hours after delivery.  In case you become weak, lightheaded, or faint, call your nurse before you get out of bed for the first time and before you take a shower for the first time.  Within the first few days after delivery, your breasts may begin to feel tender and full. This is called engorgement. Breast tenderness usually goes away within 48 72 hours after engorgement occurs. You may also notice milk leaking from your breasts. If you are not breastfeeding, do not stimulate your breasts. Breast stimulation can make your breasts produce more milk.  Spending as much time as possible with your newborn is very important. During this time, you and your newborn can feel close and get to know each other. Having your newborn stay in your room (rooming in) will help to strengthen the bond with your newborn. It will give you time to get to know your newborn and become comfortable caring for your newborn.  Your hormones change after delivery. Sometimes the hormone changes can temporarily cause you to feel sad or tearful. These feelings should not last more than a few days. If these feelings last longer than that, you should talk to your caregiver.  If desired, talk to your caregiver about  methods of family planning or contraception.  Talk to your caregiver about immunizations. Your caregiver may want you to have the following immunizations before leaving the hospital:  Tetanus, diphtheria, and pertussis (Tdap) or tetanus and diphtheria (Td) immunization. It is very important that you and your family (including grandparents) or others caring for your newborn are up-to-date with the Tdap or Td immunizations. The Tdap or Td immunization can help protect your newborn from getting ill.  Rubella immunization.  Varicella (chickenpox) immunization.  Influenza immunization. You should  receive this annual immunization if you did not receive the immunization during your pregnancy. Document Released: 11/17/2006 Document Revised: 10/15/2011 Document Reviewed: 09/17/2011 [facility] Patient Information 2014 [place], [place]. Urinary Tract Infection Urinary tract infections (UTIs) can develop anywhere along your urinary tract. Your urinary tract is your body's drainage system for removing wastes and extra water. Your urinary tract includes two kidneys, two ureters, a bladder, and a urethra. Your kidneys are a pair of bean-shaped organs. Each kidney is about the size of your fist. They are located below your ribs, one on each side of your spine. CAUSES Infections are caused by microbes, which are microscopic organisms, including fungi, viruses, and bacteria. These organisms are so small that they can only be seen through a microscope. Bacteria are the microbes that most commonly cause UTIs. SYMPTOMS  Symptoms of UTIs may vary by age and gender of the patient and by the location of the infection. Symptoms in young women typically include a frequent and intense urge to urinate and a painful, burning feeling in the bladder or urethra during urination. Older women and men are more likely to be tired, shaky, and weak and have muscle aches and abdominal pain. A fever may mean the infection is in your kidneys. Other symptoms of a kidney infection include pain in your back or sides below the ribs, nausea, and vomiting. DIAGNOSIS To diagnose a UTI, your caregiver will ask you about your symptoms. Your caregiver also will ask to provide a urine sample. The urine sample will be tested for bacteria and white blood cells. White blood cells are made by your body to help fight infection. TREATMENT  Typically, UTIs can be treated with medication. Because most UTIs are caused by a bacterial infection, they usually can be treated with the use of antibiotics. The choice of antibiotic and length of treatment depend  on your symptoms and the type of bacteria causing your infection. HOME CARE INSTRUCTIONS  If you were prescribed antibiotics, take them exactly as your caregiver instructs you. Finish the medication even if you feel better after you have only taken some of the medication.  Drink enough water and fluids to keep your urine clear or pale yellow.  Avoid caffeine, tea, and carbonated beverages. They tend to irritate your bladder.  Empty your bladder often. Avoid holding urine for long periods of time.  Empty your bladder before and after sexual intercourse.  After a bowel movement, women should cleanse from front to back. Use each tissue only once. SEEK MEDICAL CARE IF:   You have back pain.  You develop a fever.  Your symptoms do not begin to resolve within 3 days. SEEK IMMEDIATE MEDICAL CARE IF:   You have severe back pain or lower abdominal pain.  You develop chills.  You have nausea or vomiting.  You have continued burning or discomfort with urination. MAKE SURE YOU:   Understand these instructions.  Will watch your condition.  Will get help right away if  you are not doing well or get worse. Document Released: 10/30/2004 Document Revised: 07/22/2011 Document Reviewed: 02/28/2011 [facility] Patient Information 2014 [place], [place].

## 2012-11-15 NOTE — Progress Notes (Signed)
Pt. Believes she may have a UTI; urinary frequency and burning-- pt. To give urine sample.  Pt. Is back on medication for OCD but cannot recall the name of it.  Pt. Reports feeling nauseas for the last two weeks or so with an intermittent headache.

## 2012-12-20 ENCOUNTER — Encounter (HOSPITAL_COMMUNITY): Payer: Self-pay | Admitting: Emergency Medicine

## 2012-12-20 ENCOUNTER — Emergency Department (HOSPITAL_COMMUNITY)
Admission: EM | Admit: 2012-12-20 | Discharge: 2012-12-20 | Disposition: A | Discharge status: Home / Self Care | Payer: Medicaid Other | Attending: Emergency Medicine | Admitting: Emergency Medicine

## 2012-12-20 ENCOUNTER — Emergency Department (HOSPITAL_COMMUNITY): Payer: Medicaid Other

## 2012-12-20 DIAGNOSIS — Z880 Allergy status to penicillin: Secondary | ICD-10-CM | POA: Insufficient documentation

## 2012-12-20 DIAGNOSIS — R071 Chest pain on breathing: Secondary | ICD-10-CM | POA: Insufficient documentation

## 2012-12-20 DIAGNOSIS — R0781 Pleurodynia: Secondary | ICD-10-CM

## 2012-12-20 DIAGNOSIS — F17200 Nicotine dependence, unspecified, uncomplicated: Secondary | ICD-10-CM | POA: Insufficient documentation

## 2012-12-20 DIAGNOSIS — R0602 Shortness of breath: Secondary | ICD-10-CM | POA: Insufficient documentation

## 2012-12-20 DIAGNOSIS — Z3202 Encounter for pregnancy test, result negative: Secondary | ICD-10-CM | POA: Insufficient documentation

## 2012-12-20 DIAGNOSIS — R109 Unspecified abdominal pain: Secondary | ICD-10-CM | POA: Insufficient documentation

## 2012-12-20 DIAGNOSIS — Z881 Allergy status to other antibiotic agents status: Secondary | ICD-10-CM | POA: Insufficient documentation

## 2012-12-20 DIAGNOSIS — Z79899 Other long term (current) drug therapy: Secondary | ICD-10-CM | POA: Insufficient documentation

## 2012-12-20 DIAGNOSIS — Z8742 Personal history of other diseases of the female genital tract: Secondary | ICD-10-CM | POA: Insufficient documentation

## 2012-12-20 LAB — BASIC METABOLIC PANEL
BUN: 13 mg/dL (ref 6–23)
Chloride: 104 mEq/L (ref 96–112)
GFR calc Af Amer: >90 mL/min (ref >90)
GFR calc non Af Amer: >90 mL/min (ref >90)
Glucose, Bld: 87 mg/dL (ref 70–99)
Potassium: 4.6 mEq/L (ref 3.5–5.1)
Sodium: 139 mEq/L (ref 135–145)

## 2012-12-20 LAB — URINALYSIS, ROUTINE W REFLEX MICROSCOPIC
Bilirubin Urine: NEGATIVE
Glucose, UA: NEGATIVE mg/dL
Leukocytes, UA: NEGATIVE
Nitrite: NEGATIVE
Specific Gravity, Urine: 1.022 (ref 1.005–1.030)
Urobilinogen, UA: 0.2 mg/dL (ref 0.0–1.0)
pH: 7 (ref 5.0–8.0)

## 2012-12-20 LAB — D-DIMER, QUANTITATIVE: D-Dimer, Quant: <0.27 ug/mL-FEU (ref 0.00–0.48)

## 2012-12-20 LAB — CBC
Hemoglobin: 13.7 g/dL (ref 12.0–15.0)
MCHC: 33.7 g/dL (ref 30.0–36.0)
RDW: 13.7 % (ref 11.5–15.5)

## 2012-12-20 LAB — POCT PREGNANCY, URINE: Preg Test, Ur: NEGATIVE

## 2012-12-20 MED ORDER — IBUPROFEN 800 MG PO TABS
800.0000 mg | ORAL_TABLET | Freq: Once | ORAL | Status: DC
  Filled 2012-12-20: qty 1

## 2012-12-20 NOTE — ED Notes (Signed)
MD at bedside. 

## 2012-12-20 NOTE — ED Provider Notes (Signed)
CSN: [id number]     Arrival date & time 12/20/12  1804 History   First MD Initiated Contact with Patient 12/20/12 1940     Chief Complaint  Patient presents with  . Chest Pain   (Consider location/radiation/quality/duration/timing/severity/associated sxs/prior Treatment) HPI Comments: 27 yo female with vaginal delivery 2 months prior, smoker presents with mild pleuritic chest pain for 7 months, worse with moving and a deep breath, no current leg swelling/ pain or blood clot hx.  Mild intermittent ruq pain, mild back radiation at times, sometime with eating, no gb hx.   Patient is a 27 y.o. female presenting with chest pain. The history is provided by the patient.  Chest Pain Associated symptoms: abdominal pain (RUQ)   Associated symptoms: no back pain, no fever, no headache, no shortness of breath and not vomiting     Past Medical History  Diagnosis Date  . Ovarian cyst   . No pertinent past medical history    Past Surgical History  Procedure Laterality Date  . Cesarean section      first was c-section; second vbac  . Cesarean section  December 28, 2003  . Tubal ligation Bilateral 10/17/2012    Procedure: POST PARTUM TUBAL LIGATION bilateral;  Surgeon: Lazaro Arms, MD;  Location: [facility];  Service: Gynecology;  Laterality: Bilateral;   Family History  Problem Relation Age of Onset  . Endometriosis Mother   . Heart murmur Mother    History  Substance Use Topics  . Smoking status: Current Some Day Smoker -- 0.50 packs/day    Types: Cigarettes  . Smokeless tobacco: Current User  . Alcohol Use: No   OB History   Grav Para Term Preterm Abortions TAB SAB Ect Mult Living   5 4 4  1     4      Review of Systems  Constitutional: Negative for fever and chills.  HENT: Negative for congestion.   Eyes: Negative for visual disturbance.  Respiratory: Negative for shortness of breath.   Cardiovascular: Positive for chest pain.  Gastrointestinal: Positive for abdominal pain (RUQ).  Negative for vomiting.  Genitourinary: Negative for dysuria and flank pain.  Musculoskeletal: Negative for back pain, neck pain and neck stiffness.  Skin: Negative for rash.  Neurological: Negative for light-headedness and headaches.    Allergies  Amoxicillin and Penicillins  Home Medications   Current Outpatient Rx  Name  Route  Sig  Dispense  Refill  . fluvoxaMINE (LUVOX) 50 MG tablet   Oral   Take 50 mg by mouth at bedtime.         Marland Kitchen ibuprofen (ADVIL,MOTRIN) 600 MG tablet   Oral   Take 1 tablet (600 mg total) by mouth every 6 (six) hours.   30 tablet   0   . Prenatal Vit-Fe Fumarate-FA (MULTIVITAMIN-PRENATAL) 27-0.8 MG TABS tablet   Oral   Take 1 tablet by mouth daily at 12 noon.         . ciprofloxacin (CIPRO) 500 MG tablet   Oral   Take 1 tablet (500 mg total) by mouth 2 (two) times daily.   6 tablet   0    BP 157/80  Pulse 85  Temp(Src) 98.7 F (37.1 C) (Oral)  Resp 20  Wt 162 lb (73.483 kg)  SpO2 100% Physical Exam  Nursing note and vitals reviewed. Constitutional: She is oriented to person, place, and time. She appears well-developed and well-nourished.  HENT:  Head: Normocephalic and atraumatic.  Eyes: Conjunctivae are normal. Right  eye exhibits no discharge. Left eye exhibits no discharge.  Neck: Normal range of motion. Neck supple. No tracheal deviation present.  Cardiovascular: Normal rate and regular rhythm.   Pulmonary/Chest: Effort normal and breath sounds normal.  Abdominal: Soft. She exhibits no distension. There is tenderness (mild epig). There is no guarding.  Musculoskeletal: She exhibits no edema and no tenderness.  Neurological: She is alert and oriented to person, place, and time.  Skin: Skin is warm. No rash noted.  Psychiatric: She has a normal mood and affect.    ED Course  Procedures (including critical care time)  EMERGENCY DEPARTMENT BILIARY ULTRASOUND INTERPRETATION "Study: Limited Abdominal Ultrasound of the gallbladder  and common bile duct."  INDICATIONS: Abdominal pain, RUQ pain and Back pain Indication: Multiple views of the gallbladder and common bile duct were obtained in real-time with a Multi-frequency probe." PERFORMED BY:  Myself IMAGES ARCHIVED?: Yes FINDINGS: Gallstones absent, Gallbladder wall normal in thickness and Sonographic Murphy's sign absent LIMITATIONS: Bowel Gas INTERPRETATION: Normal   Labs Review Labs Reviewed  HEPATIC FUNCTION PANEL - Abnormal; Notable for the following:    ALT 48 (*)    Total Bilirubin 0.1 (*)    All other components within normal limits  CBC  BASIC METABOLIC PANEL  URINALYSIS, ROUTINE W REFLEX MICROSCOPIC  D-DIMER, QUANTITATIVE  LIPASE, BLOOD  POCT PREGNANCY, URINE   Imaging Review Dg Chest 2 View  12/20/2012   CLINICAL DATA:  Chest pain and shortness of Breath.  EXAM: CHEST  2 VIEW  COMPARISON:  None.  FINDINGS: The heart size and mediastinal contours are within normal limits. Both lungs are clear. The visualized skeletal structures are unremarkable.  IMPRESSION: No active cardiopulmonary disease.   Electronically Signed   By: Loralie Champagne M.D.   On: 12/20/2012 19:34    EKG Interpretation    Date/Time:  Monday December 20 2012 18:11:09 EST Ventricular Rate:  71 PR Interval:  152 QRS Duration: 80 QT Interval:  356 QTC Calculation: 386 R Axis:   80 Text Interpretation:  Normal sinus rhythm with sinus arrhythmia Cannot rule out Anterior infarct , age undetermined Abnormal ECG Similar to previous, V2 inversion possible lead placement            MDM   1. Pleuritic chest pain   Abd pain  Pt low risk PE, low risk CAD. EKG no acute findings, clinically pleurisy vs less likely PE with months of pain. Pt improved in ED.  With RUQ pain mild, bedside US to look for stones, no acute findings.. D dimer neg, no indication for CT. Fup outpt discussed.   DC   Enid Skeens, MD 12/21/12 [phone]

## 2012-12-20 NOTE — ED Notes (Addendum)
Presents with chest pain described as burning on both sides of chest and into right back, pain is worse with inspiration and associated with abdominal pain, nausea, head pain, eye pain and bilateral arm tingling as well as vaginal discharge and rash to thighs. Symptoms have been intermittent for 3 months. She is 2 months post partum.

## 2012-12-21 LAB — HEPATIC FUNCTION PANEL
AST: 24 U/L (ref 0–37)
Bilirubin, Direct: 0.2 mg/dL (ref 0.0–0.3)
Indirect Bilirubin: NEGATIVE mg/dL (ref 0.3–0.9)
Total Protein: 6.9 g/dL (ref 6.0–8.3)

## 2013-04-17 ENCOUNTER — Encounter: Payer: Self-pay | Admitting: *Deleted

## 2013-05-06 ENCOUNTER — Emergency Department (HOSPITAL_COMMUNITY): Payer: Medicaid Other

## 2013-05-06 ENCOUNTER — Encounter (HOSPITAL_COMMUNITY): Payer: Self-pay | Admitting: Emergency Medicine

## 2013-05-06 ENCOUNTER — Emergency Department (HOSPITAL_COMMUNITY)
Admission: EM | Admit: 2013-05-06 | Discharge: 2013-05-06 | Disposition: A | Discharge status: Home / Self Care | Payer: Medicaid Other | Attending: Emergency Medicine | Admitting: Emergency Medicine

## 2013-05-06 DIAGNOSIS — Z792 Long term (current) use of antibiotics: Secondary | ICD-10-CM | POA: Insufficient documentation

## 2013-05-06 DIAGNOSIS — S20219A Contusion of unspecified front wall of thorax, initial encounter: Secondary | ICD-10-CM | POA: Insufficient documentation

## 2013-05-06 DIAGNOSIS — W1809XA Striking against other object with subsequent fall, initial encounter: Secondary | ICD-10-CM | POA: Insufficient documentation

## 2013-05-06 DIAGNOSIS — Y9389 Activity, other specified: Secondary | ICD-10-CM | POA: Insufficient documentation

## 2013-05-06 DIAGNOSIS — W010XXA Fall on same level from slipping, tripping and stumbling without subsequent striking against object, initial encounter: Secondary | ICD-10-CM | POA: Insufficient documentation

## 2013-05-06 DIAGNOSIS — Y929 Unspecified place or not applicable: Secondary | ICD-10-CM | POA: Insufficient documentation

## 2013-05-06 DIAGNOSIS — Z880 Allergy status to penicillin: Secondary | ICD-10-CM | POA: Insufficient documentation

## 2013-05-06 DIAGNOSIS — Z79899 Other long term (current) drug therapy: Secondary | ICD-10-CM | POA: Insufficient documentation

## 2013-05-06 DIAGNOSIS — Z8742 Personal history of other diseases of the female genital tract: Secondary | ICD-10-CM | POA: Insufficient documentation

## 2013-05-06 DIAGNOSIS — F17200 Nicotine dependence, unspecified, uncomplicated: Secondary | ICD-10-CM | POA: Insufficient documentation

## 2013-05-06 MED ORDER — NAPROXEN 500 MG PO TABS: 500.0000 mg | ORAL_TABLET | Freq: Two times a day (BID) | ORAL | Status: DC

## 2013-05-06 MED ORDER — TRAMADOL HCL 50 MG PO TABS
50.0000 mg | ORAL_TABLET | Freq: Four times a day (QID) | ORAL | Status: DC | PRN
Start: 2013-05-06 — End: 2013-08-01

## 2013-05-06 NOTE — ED Notes (Signed)
Tripped and fell onto concrete patio, striking right mid back. Pain with deep breathing and movement. Pt states she was intoxicated at the time.

## 2013-05-06 NOTE — Discharge Instructions (Signed)
Please read and follow all provided instructions.  Your diagnoses today include:  1. Rib contusion    Tests performed today include:  Chest and rib x-rays - shows no fractures  Vital signs. See below for your results today.   Medications prescribed:    Tramadol - narcotic-like pain medication  DO NOT drive or perform any activities that require you to be awake and alert because this medicine can make you drowsy.    Naproxen - anti-inflammatory pain medication  Do not exceed 500mg  naproxen every 12 hours, take with food  You have been prescribed an anti-inflammatory medication or NSAID. Take with food. Take smallest effective dose for the shortest duration needed for your pain. Stop taking if you experience stomach pain or vomiting.    Take any prescribed medications only as directed.  Home care instructions:  Follow any educational materials contained in this packet.  Take 10 deep breaths every hour while awake. This helps to expand your lungs and prevent infections like pneumonia.   BE VERY CAREFUL not to take multiple medicines containing Tylenol (also called acetaminophen). Doing so can lead to an overdose which can damage your liver and cause liver failure and possibly death.   Follow-up instructions: Please follow-up with your primary care provider in the next 3 days for further evaluation of your symptoms. If you do not have a primary care doctor -- see below for referral information.   Return instructions:   Please return to the Emergency Department if you experience worsening symptoms.   Please return if you have any other emergent concerns.  Additional Information:  Your vital signs today were: BP 131/101   Pulse 113   Temp(Src) 97.9 F (36.6 C) (Oral)   Resp 20   Ht 5\' 9"  (1.753 m)   Wt 163 lb 8 oz (74.163 kg)   BMI 24.13 kg/m2   SpO2 100%   LMP 04/19/2013 If your blood pressure (BP) was elevated above 135/85 this visit, please have this repeated by your  doctor within one month. --------------

## 2013-05-06 NOTE — ED Provider Notes (Signed)
CSN: [id number]     Arrival date & time 05/06/13  1201 History   First MD Initiated Contact with Patient 05/06/13 1210    This chart was scribed for Monique DonJosh Kylee Nardozzi PA-C, a non-physician practitioner working with Flint MelterElliott L Wentz, MD by Lewanda RifeAlexandra Hurtado, ED Scribe. This patient was seen in room TR08C/TR08C and the patient's care was started at 12:13 PM    Chief Complaint  Patient presents with  . Fall    (Consider location/radiation/quality/duration/timing/severity/associated sxs/prior Treatment) The history is provided by the patient. No language interpreter was used.   HPI Comments: Monique Lyons is a 28 y.o. female who presents to the Emergency Department complaining of constant right anterior rib pain following mechanical fall onset last night. States she landed on right ribs on concrete while drinking alcohol. Describes pain as moderate in severity. Reports associated pleuritic chest pain. Denies trying any alleviating factors. Reports pain is exacerbated with deep breathing. Denies associated difficulty breathing, LOC, rash, and head injury.   Past Medical History  Diagnosis Date  . Ovarian cyst   . No pertinent past medical history    Past Surgical History  Procedure Laterality Date  . Cesarean section      first was c-section; second vbac  . Cesarean section  December 28, 2003  . Tubal ligation Bilateral 10/17/2012    Procedure: POST PARTUM TUBAL LIGATION bilateral;  Surgeon: Lazaro ArmsLuther H Eure, MD;  Location: [facility];  Service: Gynecology;  Laterality: Bilateral;   Family History  Problem Relation Age of Onset  . Endometriosis Mother   . Heart murmur Mother    History  Substance Use Topics  . Smoking status: Current Some Day Smoker -- 0.50 packs/day    Types: Cigarettes  . Smokeless tobacco: Current User  . Alcohol Use: Yes   OB History   Grav Para Term Preterm Abortions TAB SAB Ect Mult Living   5 4 4  1     4      Review of Systems  Constitutional: Negative for fever.   Respiratory: Negative for shortness of breath and stridor.   Cardiovascular: Positive for chest pain.  Musculoskeletal: Positive for myalgias.  Skin: Negative for wound.  Psychiatric/Behavioral: Negative for confusion.    Allergies  Amoxicillin and Penicillins  Home Medications   Current Outpatient Rx  Name  Route  Sig  Dispense  Refill  . ciprofloxacin (CIPRO) 500 MG tablet   Oral   Take 1 tablet (500 mg total) by mouth 2 (two) times daily.   6 tablet   0   . fluvoxaMINE (LUVOX) 50 MG tablet   Oral   Take 50 mg by mouth at bedtime.         Marland Kitchen. ibuprofen (ADVIL,MOTRIN) 600 MG tablet   Oral   Take 1 tablet (600 mg total) by mouth every 6 (six) hours.   30 tablet   0   . Prenatal Vit-Fe Fumarate-FA (MULTIVITAMIN-PRENATAL) 27-0.8 MG TABS tablet   Oral   Take 1 tablet by mouth daily at 12 noon.          BP 131/101  Pulse 113  Temp(Src) 97.9 F (36.6 C) (Oral)  Resp 20  Ht 5\' 9"  (1.753 m)  Wt 163 lb 8 oz (74.163 kg)  BMI 24.13 kg/m2  SpO2 100%  Physical Exam  Nursing note and vitals reviewed. Constitutional: She appears well-developed and well-nourished. No distress.  HENT:  Head: Normocephalic and atraumatic.  Eyes: EOM are normal.  Neck: Neck supple. No tracheal  deviation present.  Cardiovascular: Normal rate.   Pulmonary/Chest: Effort normal and breath sounds normal. No respiratory distress. She has no wheezes. She has no rales. She exhibits tenderness.  Musculoskeletal: Normal range of motion.  Right focal posterolateral rib pain with palpation. No crepitus. No skin changes.   Neurological: She is alert.  Skin: Skin is warm and dry. No rash noted.  Psychiatric: She has a normal mood and affect. Her behavior is normal.    ED Course  Procedures (including critical care time) COORDINATION OF CARE:  Nursing notes reviewed. Vital signs reviewed. Initial pt interview and examination performed.   Filed Vitals:   05/06/13 1208  BP: 131/101  Pulse: 113   Temp: 97.9 F (36.6 C)  TempSrc: Oral  Resp: 20  Height: 5\' 9"  (1.753 m)  Weight: 163 lb 8 oz (74.163 kg)  SpO2: 100%    12:13 PM-Discussed work up plan with pt at bedside, which includes  Orders Placed This Encounter  Procedures  . DG Ribs Unilateral W/Chest Right    Standing Status: Standing     Number of Occurrences: 1     Standing Expiration Date:     Order Specific Question:  Reason for exam:    Answer:  FALL  . Pt agrees with plan.   Initial diagnostic testing ordered.     Labs Review Labs Reviewed - No data to display Imaging Review Dg Ribs Unilateral W/chest Right  05/06/2013   CLINICAL DATA:  Fall.  Right posterior rib pain.  EXAM: RIGHT RIBS AND CHEST - 3+ VIEW  COMPARISON:  12/20/2012 chest radiographs.  FINDINGS: The cardiomediastinal silhouette is within normal limits. The lungs are well inflated and clear. There is no evidence of pleural effusion or pneumothorax. No rib fracture is identified.  IMPRESSION: Negative.   Electronically Signed   By: Sebastian Ache   On: 05/06/2013 13:15     EKG Interpretation None      Vital signs reviewed and are as follows: Filed Vitals:   05/06/13 1208  BP: 131/101  Pulse: 113  Temp: 97.9 F (36.6 C)  Resp: 20   Pt informed of x-ray results.   Patient was counseled on RICE protocol and told to rest injury, use ice for no longer than 15 minutes every hour, compress the area, and elevate above the level of their heart as much as possible to reduce swelling. Questions answered. Patient verbalized understanding.    Patient counseled on use of narcotic pain medications. Counseled not to combine these medications with others containing tylenol. Urged not to drink alcohol, drive, or perform any other activities that requires focus while taking these medications. The patient verbalizes understanding and agrees with the plan.  Take 10 deep breaths every hour while awake. This helps to expand your lungs and prevent infections like  pneumonia.   MDM   Final diagnoses:  Rib contusion   Rib pain, neg CXR, no obvious fracture. Pt is well-appearing. Conservative management indicated.   I personally performed the services described in this documentation, which was scribed in my presence. The recorded information has been reviewed and is accurate.    Renne Crigler, PA-C 05/06/13 1343

## 2013-05-06 NOTE — ED Notes (Signed)
She tripped last night, fell onto her R side. shes having R rib pain since,hurts to breathe and move and touch. She is breathing easily, no other complaints.

## 2013-05-06 NOTE — ED Provider Notes (Signed)
Medical screening examination/treatment/procedure(s) were performed by non-physician practitioner and as supervising physician I was immediately available for consultation/collaboration.  Benedetto Ryder L WeFlint Melterntz, MD 05/06/13 [phone]

## 2013-08-01 ENCOUNTER — Emergency Department (HOSPITAL_COMMUNITY)
Admission: EM | Admit: 2013-08-01 | Discharge: 2013-08-01 | Disposition: A | Discharge status: Home / Self Care | Payer: Medicaid Other | Attending: Emergency Medicine | Admitting: Emergency Medicine

## 2013-08-01 ENCOUNTER — Encounter (HOSPITAL_COMMUNITY): Payer: Self-pay | Admitting: Emergency Medicine

## 2013-08-01 ENCOUNTER — Other Ambulatory Visit: Payer: Self-pay

## 2013-08-01 DIAGNOSIS — F17200 Nicotine dependence, unspecified, uncomplicated: Secondary | ICD-10-CM | POA: Insufficient documentation

## 2013-08-01 DIAGNOSIS — Z8742 Personal history of other diseases of the female genital tract: Secondary | ICD-10-CM | POA: Insufficient documentation

## 2013-08-01 DIAGNOSIS — Z9851 Tubal ligation status: Secondary | ICD-10-CM | POA: Insufficient documentation

## 2013-08-01 DIAGNOSIS — N760 Acute vaginitis: Secondary | ICD-10-CM | POA: Insufficient documentation

## 2013-08-01 DIAGNOSIS — Z9889 Other specified postprocedural states: Secondary | ICD-10-CM | POA: Insufficient documentation

## 2013-08-01 DIAGNOSIS — R350 Frequency of micturition: Secondary | ICD-10-CM | POA: Insufficient documentation

## 2013-08-01 DIAGNOSIS — Z79899 Other long term (current) drug therapy: Secondary | ICD-10-CM | POA: Insufficient documentation

## 2013-08-01 DIAGNOSIS — R079 Chest pain, unspecified: Secondary | ICD-10-CM | POA: Insufficient documentation

## 2013-08-01 DIAGNOSIS — Z3202 Encounter for pregnancy test, result negative: Secondary | ICD-10-CM | POA: Insufficient documentation

## 2013-08-01 DIAGNOSIS — M549 Dorsalgia, unspecified: Secondary | ICD-10-CM | POA: Insufficient documentation

## 2013-08-01 DIAGNOSIS — Z880 Allergy status to penicillin: Secondary | ICD-10-CM | POA: Insufficient documentation

## 2013-08-01 DIAGNOSIS — B9689 Other specified bacterial agents as the cause of diseases classified elsewhere: Secondary | ICD-10-CM

## 2013-08-01 LAB — URINE MICROSCOPIC-ADD ON

## 2013-08-01 LAB — CBC
HEMATOCRIT: 44.9 % (ref 36.0–46.0)
Hemoglobin: 14.7 g/dL (ref 12.0–15.0)
MCH: 31.7 pg (ref 26.0–34.0)
MCHC: 32.7 g/dL (ref 30.0–36.0)
MCV: 96.8 fL (ref 78.0–100.0)
Platelets: 171 10*3/uL (ref 150–400)
RBC: 4.64 MIL/uL (ref 3.87–5.11)
RDW: 13.4 % (ref 11.5–15.5)
WBC: 5.6 10*3/uL (ref 4.0–10.5)

## 2013-08-01 LAB — WET PREP, GENITAL
TRICH WET PREP: NONE SEEN
Yeast Wet Prep HPF POC: NONE SEEN

## 2013-08-01 LAB — URINALYSIS, ROUTINE W REFLEX MICROSCOPIC
Bilirubin Urine: NEGATIVE
Glucose, UA: NEGATIVE mg/dL
Hgb urine dipstick: NEGATIVE
Ketones, ur: NEGATIVE mg/dL
Nitrite: NEGATIVE
PH: 5 (ref 5.0–8.0)
Protein, ur: NEGATIVE mg/dL
SPECIFIC GRAVITY, URINE: 1.024 (ref 1.005–1.030)
Urobilinogen, UA: 0.2 mg/dL (ref 0.0–1.0)

## 2013-08-01 LAB — BASIC METABOLIC PANEL
BUN: 9 mg/dL (ref 6–23)
CO2: 22 meq/L (ref 19–32)
Calcium: 9 mg/dL (ref 8.4–10.5)
Chloride: 103 mEq/L (ref 96–112)
Creatinine, Ser: 0.66 mg/dL (ref 0.50–1.10)
GFR calc Af Amer: >90 mL/min (ref >90)
GLUCOSE: 99 mg/dL (ref 70–99)
POTASSIUM: 4.8 meq/L (ref 3.7–5.3)
SODIUM: 139 meq/L (ref 137–147)

## 2013-08-01 LAB — I-STAT TROPONIN, ED: Troponin i, poc: 0 ng/mL (ref 0.00–0.08)

## 2013-08-01 LAB — PREGNANCY, URINE: Preg Test, Ur: NEGATIVE

## 2013-08-01 MED ORDER — LIDOCAINE HCL (PF) 1 % IJ SOLN
INTRAMUSCULAR | Status: AC
  Administered 2013-08-01: 5 mL
  Filled 2013-08-01: qty 5

## 2013-08-01 MED ORDER — METRONIDAZOLE 500 MG PO TABS: 500.0000 mg | ORAL_TABLET | Freq: Two times a day (BID) | ORAL | Status: DC

## 2013-08-01 MED ORDER — CEFTRIAXONE SODIUM 250 MG IJ SOLR
250.0000 mg | Freq: Once | INTRAMUSCULAR | Status: AC
  Administered 2013-08-01: 250 mg via INTRAMUSCULAR
  Filled 2013-08-01: qty 250

## 2013-08-01 MED ORDER — AZITHROMYCIN 250 MG PO TABS
1000.0000 mg | ORAL_TABLET | Freq: Once | ORAL | Status: AC
  Administered 2013-08-01: 1000 mg via ORAL
  Filled 2013-08-01: qty 4

## 2013-08-01 MED ORDER — LIDOCAINE HCL (PF) 1 % IJ SOLN
0.9000 mL | Freq: Once | INTRAMUSCULAR | Status: AC
  Administered 2013-08-01: 0.9 mL

## 2013-08-01 MED ORDER — KETOROLAC TROMETHAMINE 30 MG/ML IJ SOLN
60.0000 mg | Freq: Once | INTRAMUSCULAR | Status: AC
  Administered 2013-08-01: 60 mg via INTRAMUSCULAR
  Filled 2013-08-01: qty 2

## 2013-08-01 NOTE — ED Notes (Signed)
PT states intermittent left chest pains, lower abdominal pain, frequent urination, lower back pain, intermittent right leg numbness, dizzy, nauseated.  LMP - unsure

## 2013-08-01 NOTE — ED Provider Notes (Signed)
Medical screening examination/treatment/procedure(s) were conducted as a shared visit with non-physician practitioner(s) and myself.  I personally evaluated the patient during the encounter.   EKG Interpretation   Date/Time:  Monday August 01 2013 11:42:21 EDT Ventricular Rate:  101 PR Interval:  114 QRS Duration: 76 QT Interval:  342 QTC Calculation: 443 R Axis:   94 Text Interpretation:  Sinus tachycardia Rightward axis Borderline ECG  Confirmed by WARD,  DO, KRISTEN ([id number]) on 08/01/2013 3:56:10 PM      Pt is a 28 y.o. F with history of ovarian cysts, anxiety who presents emergency department with multiple complaints. She states that she has had intermittent left-sided chest pain without associated shortness of breath, diaphoresis or dizziness has been present for several months with no aggravating or alleviating factors. She is also had frequent urination, vaginal discharge or lower back pain. She's also had intermittent right leg numbness but no saddle anesthesia, bowel or bladder incontinence, fever, with weakness. Her exam is unremarkable. She is neurologically intact. Hemodynamically stable. Nontoxic appearing. No apparent distress. EKG shows right axis deviation but no other sign of arrhythmia or signs of right heart strain. She is PERC negative and has no receptors for ACS. She is currently chest pain-free. Cardiac labs are unremarkable. Pelvic exam did reveal some mucoid discharge from the cervix. Will empirically treat for gonorrhea and Chlamydia. There is no signs to suggest PID. Urine pregnancy is negative. Urinalysis shows moderate leukocytes but this appears to be a dirty catch. Have discussed with patient that given all of her symptoms are chronic in nature and not appear life-threatening, I feel she is safe to be discharged home. We'll give him outpatient resource guide so that she can establish a PCP. Patient verbalizes understanding and is comfortable with this plan.     Date:  08/01/2013 16:16  Rate: 67  Rhythm: normal sinus rhythm  QRS Axis: Right axis deviation  Intervals: normal  ST/T Wave abnormalities: normal  Conduction Disutrbances: none  Narrative Interpretation: unremarkable      Layla MawKristen N Ward, DO 08/01/13 1652

## 2013-08-01 NOTE — ED Provider Notes (Signed)
CSN: [id number]     Arrival date & time 08/01/13  1137 History   First MD Initiated Contact with Patient 08/01/13 1538     Chief Complaint  Patient presents with  . Chest Pain  . Abdominal Pain  . Urinary Frequency     (Consider location/radiation/quality/duration/timing/severity/associated sxs/prior Treatment) HPI Comments: Monique Lyons is a 28 y.o. Female with a PMHx of ovarian cysts, and a PSHx of BTL in 10/2012 presenting with ongoing lower abd pain x years, which increased in the last 4 days, 8/10 aching, radiates to her lower back, unsure of aggravating or alleviating factors, associated with dysuria, increased frequency, white vaginal discharge and vaginal spotting. Denies vaginal itching, hematuria, malodorous urine, increased urgency, or dyspareunia. Sexually active without protection, with one partner (husband). Remote hx of STDs 8 years ago. Denies fevers/chills, diarrhea, constipation, HA, CP, SOB, N/V, myalgias, or arthralgias.   Also complains of intermittent CP that varies in location, unsure of aggravating or alleviating factors, ongoing for years (since she was 7 mos pregnant with last child, who is now 46 mos old). Denies SOB, ongoing CP at the moment, cough, diaphoresis, N/V, or paresthesias. Denies LE swelling or erythema, no recent travel, no malignancy. States that her RLE has pins and needles in it when she hangs her legs over the edge of the bed, but occasionally it occurs at other times although she cannot specify positions or actions that aggravate or alleviate this symptom. Denies having the pain at the moment. Denies focal neuro deficits or weakness, saddle anesthesia, or incontinence. Of note, pt reports having anxiety/OCD and has been extremely stressed recently, has 4 children and "doesn't stop" all day. She presented today because all of her symptoms became too much to bear. Has tried tylenol for all of these symptoms and has had no relief.   Patient is a 28 y.o.  female presenting with chest pain, abdominal pain, and frequency. The history is provided by the patient. No language interpreter was used.  Chest Pain Associated symptoms: abdominal pain and back pain   Associated symptoms: no cough, no diaphoresis, no dizziness, no fever, no headache, no nausea, no palpitations, no shortness of breath, not vomiting and no weakness   Abdominal Pain Pain location:  LLQ, RLQ and suprapubic Pain quality: aching   Pain radiates to:  Back Pain severity:  Mild Onset quality:  Gradual Duration:  4 days Timing:  Intermittent Progression:  Waxing and waning Chronicity:  New Context: alcohol use (infrequent) and recent sexual activity   Context: not eating, not recent travel and not sick contacts   Relieved by:  None tried Worsened by:  Nothing tried Ineffective treatments:  None tried Associated symptoms: chest pain, dysuria, vaginal bleeding and vaginal discharge   Associated symptoms: no belching, no chills, no constipation, no cough, no diarrhea, no fever, no flatus, no hematemesis, no hematochezia, no hematuria, no melena, no nausea, no shortness of breath, no sore throat and no vomiting   Urinary Frequency Associated symptoms include abdominal pain and chest pain. Pertinent negatives include no arthralgias, chills, coughing, diaphoresis, fever, headaches, joint swelling, myalgias, nausea, rash, sore throat, vomiting or weakness.    Past Medical History  Diagnosis Date  . Ovarian cyst   . No pertinent past medical history    Past Surgical History  Procedure Laterality Date  . Cesarean section      first was c-section; second vbac  . Cesarean section  December 28, 2003  . Tubal ligation Bilateral 10/17/2012  Procedure: POST PARTUM TUBAL LIGATION bilateral;  Surgeon: Lazaro Arms, MD;  Location: [facility];  Service: Gynecology;  Laterality: Bilateral;   Family History  Problem Relation Age of Onset  . Endometriosis Mother   . Heart murmur Mother     History  Substance Use Topics  . Smoking status: Current Some Day Smoker -- 0.50 packs/day    Types: Cigarettes  . Smokeless tobacco: Current User  . Alcohol Use: Yes   OB History   Grav Para Term Preterm Abortions TAB SAB Ect Mult Living   5 4 4  1     4      Review of Systems  Constitutional: Negative for fever, chills, diaphoresis and appetite change.  HENT: Negative for sore throat.   Eyes: Negative for photophobia and visual disturbance.  Respiratory: Negative for cough, chest tightness, shortness of breath and wheezing.   Cardiovascular: Positive for chest pain. Negative for palpitations and leg swelling.  Gastrointestinal: Positive for abdominal pain. Negative for nausea, vomiting, diarrhea, constipation, blood in stool, melena, hematochezia, flatus and hematemesis.  Genitourinary: Positive for dysuria, frequency, vaginal bleeding and vaginal discharge. Negative for urgency, hematuria, flank pain, decreased urine volume, genital sores, vaginal pain, menstrual problem, pelvic pain and dyspareunia.  Musculoskeletal: Positive for back pain. Negative for arthralgias, gait problem, joint swelling and myalgias.  Skin: Negative for rash.  Neurological: Negative for dizziness, syncope, weakness, light-headedness and headaches.  Psychiatric/Behavioral: Negative for confusion.  10 Systems reviewed and are negative for acute change except as noted in the HPI.     Allergies  Amoxicillin and Penicillins  Home Medications   Prior to Admission medications   Medication Sig Start Date End Date Taking? Authorizing Provider  acetaminophen (TYLENOL) 500 MG tablet Take 1,000 mg by mouth every 6 (six) hours as needed for headache (pain).   Yes Historical Provider, MD  Multiple Vitamins-Minerals (ADULT ONE DAILY GUMMIES PO) Take 2 tablets by mouth daily.   Yes Historical Provider, MD  Multiple Vitamins-Minerals (HAIR/SKIN/NAILS) TABS Take 2 tablets by mouth daily.   Yes Historical Provider,  MD  metroNIDAZOLE (FLAGYL) 500 MG tablet Take 1 tablet (500 mg total) by mouth 2 (two) times daily. DO NOT DRINK ALCOHOL WHILE TAKING THIS MEDICATION. 08/01/13   Mercedes Strupp Camprubi-Soms, PA-C   BP 135/86  Pulse 93  Temp(Src) 98.2 F (36.8 C) (Oral)  Resp 18  Ht 5\' 9"  (1.753 m)  Wt 156 lb (70.761 kg)  BMI 23.03 kg/m2  SpO2 99% Physical Exam  Nursing note and vitals reviewed. Constitutional: She is oriented to person, place, and time. Vital signs are normal. She appears well-developed and well-nourished. No distress.  HENT:  Head: Normocephalic and atraumatic.  Nose: Nose normal.  Mouth/Throat: Oropharynx is clear and moist and mucous membranes are normal.  Eyes: Conjunctivae and EOM are normal. Right eye exhibits no discharge. Left eye exhibits no discharge.  Neck: Normal range of motion. Neck supple. No spinous process tenderness and no muscular tenderness present.  Cardiovascular: Normal rate, regular rhythm, normal heart sounds and intact distal pulses.   No murmur heard. Pulmonary/Chest: Effort normal and breath sounds normal. She has no wheezes. She has no rales.  Chest wall non-TTP. CTAB in all lung fields.  Abdominal: Soft. Normal appearance and bowel sounds are normal. She exhibits no distension. There is no tenderness. There is no rigidity, no rebound, no guarding, no CVA tenderness, no tenderness at McBurney's point and negative Murphy's sign.  Soft, NT/ND, +BS throughout, no r/g/r. Neg murphy's,  neg McBurney's. No CVA tenderness.   Genitourinary: There is no rash, tenderness, lesion or injury on the right labia. There is no rash, tenderness, lesion or injury on the left labia. Uterus is not deviated, not enlarged, not fixed and not tender. Cervix exhibits no motion tenderness, no discharge and no friability. Right adnexum displays no mass, no tenderness and no fullness. Left adnexum displays no mass, no tenderness and no fullness. No erythema, tenderness or bleeding around  the vagina. Vaginal discharge found.  White mucoid discharge in vaginal vault, cervix non-erythematous, no vaginal bleeding noted. No adnexal masses or TTP. Discomfort with cervical motion, but no tenderness reported. No cervical friability. White discharge noted at os. Uterus midline and mobile, no TTP or enlargement.  Musculoskeletal: Normal range of motion.       Lumbar back: She exhibits tenderness (trace, L of midline).  Mild lumbar TTP, left of the midline, no bony deformity or crepitus, no step-offs. ROM intact. No spasms noted.   Neurological: She is alert and oriented to person, place, and time. She has normal strength. No cranial nerve deficit or sensory deficit.  CNII-XII grossly intact, strength 5/5, sensation grossly intact.   Skin: Skin is warm, dry and intact. No rash noted.  Psychiatric: She has a normal mood and affect.    ED Course  Procedures (including critical care time) Labs Review Labs Reviewed  WET PREP, GENITAL - Abnormal; Notable for the following:    Clue Cells Wet Prep HPF POC FEW (*)    WBC, Wet Prep HPF POC MODERATE (*)    All other components within normal limits  URINALYSIS, ROUTINE W REFLEX MICROSCOPIC - Abnormal; Notable for the following:    APPearance CLOUDY (*)    Leukocytes, UA MODERATE (*)    All other components within normal limits  URINE MICROSCOPIC-ADD ON - Abnormal; Notable for the following:    Squamous Epithelial / LPF MANY (*)    All other components within normal limits  GC/CHLAMYDIA PROBE AMP  CBC  BASIC METABOLIC PANEL  PREGNANCY, URINE  I-STAT TROPOININ, ED    Imaging Review No results found.   EKG Interpretation   Date/Time:  Monday August 01 2013 11:42:21 EDT Ventricular Rate:  101 PR Interval:  114 QRS Duration: 76 QT Interval:  342 QTC Calculation: 443 R Axis:   94 Text Interpretation:  Sinus tachycardia Rightward axis Borderline ECG  Confirmed by WARD,  DO, KRISTEN ([id number]) on 08/01/2013 3:56:10 PM     Repeat EKG:  Ventricular rate 67, normal EKG  MDM   Final diagnoses:  Bacterial vaginosis    Caralee AtesJanet D Degrace is a 28 y.o. female with a PMHx of ovarian cysts presenting today with multiple complaints ongoing for years. Main complaint today is vaginal discharge and lower abd pain that increased over the last 4 days. No CVA TTP, minimal CMT (discomfort more than pain). Pelvic notable for white discharge, will send wet prep and GC/CT. Treat prophylactically for GC/CT today. I don't believe PID is highly likely at this time due to minimal discomfort with CMT. Doubt ruptured ovarian cyst given no adnexal TTP or fullness. Remainder of exam is unremarkable, pt is in NAD, cardiovascularly and neurologically intact, with no ongoing symptoms. I believe many of her other complaints are psychosomatic/stress-related in nature, pt is not currently having CP/SOB and is PERC negative, therefore low likelihood for PE/ACS. EKG WNL. Cardiac Labs all WNL. BML and CBC WNL. Many of the pt's symptoms have been ongoing for years, therefore  with negative labs at this time, do not feel the need for further work up or evaluation today. U/A with leuks but many squamous cells therefore I believe this is a dirty catch, no need to treat for UTI. At this time, I have explained to the pt that there is a low likelihood for PE/ACS/PNA as well as UTI, and pt feels comfortable with following up with her PCP for the remainder of her symptoms. Gave Toradol for pain now, awaiting wet prep results.   4:58 PM Wet prep reveals few clue cells. Will tx with Flagyl x 7 days. Instructed pt not to consume alcohol during this tx. Will dc with resource guide to help with f/up coordination. I explained the diagnosis and have given explicit precautions to return to the ER including for any other new or worsening symptoms. The patient understands and accepts the medical plan as it's been dictated and I have answered their questions. Discharge instructions concerning  home care and prescriptions have been given. The patient is STABLE and is discharged to home in good condition.  BP 135/86  Pulse 93  Temp(Src) 98.2 F (36.8 C) (Oral)  Resp 18  Ht 5\' 9"  (1.753 m)  Wt 156 lb (70.761 kg)  BMI 23.03 kg/m2  SpO2 99%   Celanese CorporationMercedes Strupp Camprubi-Soms, PA-C 08/01/13 1710

## 2013-08-01 NOTE — Discharge Instructions (Signed)
Please take all of your antibiotics until finished! DO NOT DRINK ALCOHOL WHILE TAKING THIS MEDICATION.   You may develop abdominal discomfort or diarrhea from the antibiotic.  You may help offset this with probiotics which you can buy or get in yogurt. Do not eat  or take the probiotics until 2 hours after your antibiotic.  Find a primary care doctor using the resource guide below in order to be further evaluated of your ongoing symptoms. You will get a call from the hospital if your test results today come back ABNORMAL, but will not hear from [place] if there is no abnormal results.   Bacterial Vaginosis Bacterial vaginosis is a vaginal infection that occurs when the normal balance of bacteria in the vagina is disrupted. It results from an overgrowth of certain bacteria. This is the most common vaginal infection in women of childbearing age. Treatment is important to prevent complications, especially in pregnant women, as it can cause a premature delivery. CAUSES  Bacterial vaginosis is caused by an increase in harmful bacteria that are normally present in smaller amounts in the vagina. Several different kinds of bacteria can cause bacterial vaginosis. However, the reason that the condition develops is not fully understood. RISK FACTORS Certain activities or behaviors can put you at an increased risk of developing bacterial vaginosis, including:  Having a new sex partner or multiple sex partners.  Douching.  Using an intrauterine device (IUD) for contraception. Women do not get bacterial vaginosis from toilet seats, bedding, swimming pools, or contact with objects around them. SIGNS AND SYMPTOMS  Some women with bacterial vaginosis have no signs or symptoms. Common symptoms include:  Grey vaginal discharge.  A fishlike odor with discharge, especially after sexual intercourse.  Itching or burning of the vagina and vulva.  Burning or pain with urination. DIAGNOSIS  Your health care provider  will take a medical history and examine the vagina for signs of bacterial vaginosis. A sample of vaginal fluid may be taken. Your health care provider will look at this sample under a microscope to check for bacteria and abnormal cells. A vaginal pH test may also be done.  TREATMENT  Bacterial vaginosis may be treated with antibiotic medicines. These may be given in the form of a pill or a vaginal cream. A second round of antibiotics may be prescribed if the condition comes back after treatment.  HOME CARE INSTRUCTIONS   Only take over-the-counter or prescription medicines as directed by your health care provider.  If antibiotic medicine was prescribed, take it as directed. Make sure you finish it even if you start to feel better.  Do not have sex until treatment is completed.  Tell all sexual partners that you have a vaginal infection. They should see their health care provider and be treated if they have problems, such as a mild rash or itching.  Practice safe sex by using condoms and only having one sex partner. SEEK MEDICAL CARE IF:   Your symptoms are not improving after 3 days of treatment.  You have increased discharge or pain.  You have a fever. MAKE SURE YOU:   Understand these instructions.  Will watch your condition.  Will get help right away if you are not doing well or get worse. FOR MORE INFORMATION  Centers for Disease Control and Prevention, Division of STD Prevention: SolutionApps.co.za American Sexual Health Association (ASHA): www.ashastd.org  Document Released: 01/20/2005 Document Revised: 11/10/2012 Document Reviewed: 09/01/2012 [facility] Patient Information 2015 [place], [place]. This information is not intended  to replace advice given to you by your health care provider. Make sure you discuss any questions you have with your health care provider.  Emergency Department Resource Guide 1) Find a Doctor and Pay Out of Pocket Although you won't have to find out who is  covered by your insurance plan, it is a good idea to ask around and get recommendations. You will then need to call the office and see if the doctor you have chosen will accept you as a new patient and what types of options they offer for patients who are self-pay. Some doctors offer discounts or will set up payment plans for their patients who do not have insurance, but you will need to ask so you aren't surprised when you get to your appointment.  2) Contact Your Local Health Department Not all health departments have doctors that can see patients for sick visits, but many do, so it is worth a call to see if yours does. If you don't know where your local health department is, you can check in your phone book. The CDC also has a tool to help you locate your state's health department, and many state websites also have listings of all of their local health departments.  3) Find a Walk-in Clinic If your illness is not likely to be very severe or complicated, you may want to try a walk in clinic. These are popping up all over the country in pharmacies, drugstores, and shopping centers. They're usually staffed by nurse practitioners or physician assistants that have been trained to treat common illnesses and complaints. They're usually fairly quick and inexpensive. However, if you have serious medical issues or chronic medical problems, these are probably not your best option.  No Primary Care Doctor: - Call Health Connect at  [phone] - they can help you locate a primary care doctor that  accepts your insurance, provides certain services, etc. - Physician Referral Service- [phone]  Chronic Pain Problems: Organization         Address  Phone   Notes  [facility]  [phone] Patients need to be referred by their primary care doctor.   Medication Assistance: Organization         Address  Phone   Notes  [facility]ication [facility] [address] [place], [place] [address] [phone] --Must be a resident of [facility] [place] [place] -- Must have NO insurance coverage whatsoever (no Medicaid/ Medicare, etc.) -- The pt. MUST have a primary care doctor that directs their care regularly and follows them in the community   MedAssist  [phone]   [employer]  [phone]    Agencies that provide inexpensive medical care: Organization         Address  Phone   Notes  [facility]  [phone]   [facility]    [phone]   [facility] [address] [place], [place] [address] [phone]   [facility] [address], [place] [phone]   [facility]anned Parenthood    [phone]   [facility]    [phone]   [facility]  [address], [place] Phone:  [phone], Fax:  [phone] Hours of Operation:  9 am - 6 pm, M-F.  Also accepts Medicaid/Medicare and self-pay.  [facility]  [address], [place] Phone: [phone], Fax: [phone]. Hours of Operation:  8:30 am - 5:30 pm, M-F.  Also accepts Medicaid and self-pay.  [facility] [place] [address] [place] Phone: [phone]   [facility] [address] Natasha Bence [place], [place] [phone], Ext. [phone] Mondays & Thursdays: 7-9 AM.  First 15 patients are seen on a first come, first serve basis.    Medicaid-accepting [facility] Providers:  Organization         Address  Phone   Notes  [facility] [address], [place] [phone] Also accepts self-pay patients.  [facility] [address] Laurell [place], [place]  [phone]   [facility] [address], [place] [phone]   [facility] [address] [place] [phone]   Renaye Rakers [address], [place]   [phone] Only accepts [place] patients after they have their name applied to their card.   Self-Pay (no insurance) in [facility]:  Organization         Address  Phone   Notes  Sickle Cell Patients, [facility] [address] [place], [place] [phone]   [facility] [address]  [place], [place] [phone]   [facility]  [address], [place] [phone]   [facility]/Dr. Osei-Bonsu  [address] [place] or [address], [place] [phone] Phone number for both [place] and [place] locations is the same.  [facility] [address], [place] [phone]   [facility] [address] , [place] or [address] [phone]   [facility] [address], [place] [phone], phone; [phone], fax Sees patients 1st and 3rd Saturday of every month.  Must not qualify for public or private insurance (i.e. Medicaid, Medicare, [place] Health Choice, Veterans' Benefits)  Household income should be no more than 200% of the poverty level The clinic cannot treat you if you are pregnant or think you are pregnant  Sexually transmitted diseases are not treated at the clinic.    Dental Care: Organization         Address  Phone  Notes  [facility] [address] [place], [place] [phone] Accepts children up to age 29 who are enrolled in [place] or [place] Health Choice; pregnant women with a Medicaid card; and children who have applied for Medicaid or [place] Health Choice, but were declined, whose parents can pay a reduced fee at time of service.  [facility]  [address], [place] [phone] Accepts children up to age 50 who are enrolled  in [place] or [place] Health Choice; pregnant women with a Medicaid card; and children who have applied for Medicaid or [place] Health Choice, but were declined, whose parents can pay a reduced fee at time of service.  [facility]  [address]  [place], [place] [phone] Patients are seen by appointment only. Walk-ins are not accepted. [facility] will see patients 63 years of age and older. Monday - Tuesday (8am-5pm) Most Wednesdays (8:30-5pm) $30 per visit, cash only  [facility]  [address],  [place] [phone] Patients are seen by appointment only. Walk-ins are not accepted. [facility] will see patients 29 years of age and older. One Wednesday Evening (Monthly: Volunteer Based).  $30 per visit, cash only  [employer] [facility] [facility]  [phone] for adults; Children under age 424, call Graduate Pediatric Dentistry at [phone]. Children aged 724-14, please call [phone] to request a pediatric application.  Dental services are provided in all areas of dental care including fillings, crowns and bridges, complete and partial dentures, implants, gum treatment, root canals, and extractions. Preventive care is also provided. Treatment is provided to both adults and children. Patients are selected via a lottery and there is often a waiting list.   [facility] [address], [place]  [phone] [email or web address]   [facility] [address], [place], [place] [phone], Ext. [phone] Second and Fourth Thursday of each month, opens at 6:30 AM; Clinic ends at 9 AM.  Patients are seen on a first-come first-served basis, and a limited number are seen during each clinic.   [facility]  [address], [place], [place] [phone]) [phone]   Eligibility Requirements You must have lived in [place], [place], or [place] counties for at least the last three months.   You cannot be  eligible for state or federal sponsored National Cityhealthcare insurance, including CIGNAVeterans Administration, [place]icaid, or [employer] EntertainmentMedicare.   You generally cannot be eligible for healthcare insurance through your employer.    How to apply: Eligibility screenings are held every Tuesday and Wednesday afternoon from 1:00 pm until 4:00 pm. You do not need an appointment for the interview!  [facility] [address], [place], [place] [phone]   [facility]  [phone]   [facility]  [phone]   [facility]  [phone]    Behavioral Health Resources in the Community: Intensive Outpatient Programs Organization         Address  Phone  Notes  [facility] [address], [place], [place] [phone]   [facility]patient [address], [place], [place] [phone]   [facility] [address], [place], [place]  [phone]   [facility] [address],  [place], [place] [phone] or [phone]   Substance Abuse Resources Organization         Address  Phone  Notes  [facility] Drug Services  [phone]   [facility]  [phone]   [facility]  [phone]   [facility]  [phone]   Residential & Outpatient Substance Abuse Program  [phone]   Psychological Services Organization         Address  Phone  Notes  [facility]  [phone]- [phone]   [facility]  [phone]- [phone]   [facility] [address], [place] [phone] or [phone]    Mobile Crisis Teams Organization         Address  Phone  Notes  [facility], Mobile Crisis Care Unit  [phone]   [facility]  [address] [place], [place] [phone]   Doristine LocksSharon DeEsch [address] [place] [phone]    Self-Help/Support Groups Organization          Address  Phone             Notes  Mental Health Assoc. of  - variety of support  groups  [phone] Call for more information  Narcotics Anonymous (NA), Caring Services [address], [place]  2 meetings at this location   Residential Sports administratorTreatment Programs Organization         Address  Phone  Notes  [facility] [address],    [place]  [phone]   [facility]  [address], [place] [phone], [place], [place] [phone]   [facility] [address], [place] [phone] Admissions: 8am-3pm M-F  [facility] [address]    [place], [place] [phone]   [facility] [address], [place], [place] [phone]   [facility] [address]  [place], [place] [phone]   [facility] - Intensive Outpatient [address], [place], [place] [phone]   [facility] (Addiction Recovery Care Assoc[facility] [address]  [place], [place] [phone] or [phone]   [facility] ([facility]) [address] [place], [place] [phone] Accepts Medicaid  [facility] [address]  [place] [phone] Substance Abuse/Addiction Treatment   [facility] Health Resources Organization         Address  Phone  Notes  [facility]  [phone]) [phone]   Angie FavaJulie Brannon, PhD [address], [address] [place], [place]   [phone]) [phone] or [phone](336) [phone]   [facility]   [address] [place], [place] [phone]   [facility] [address], [place], [place] [phone] Insurance/Medicaid/sponsorship through [facility]  Faith and Families [address]                                    [place], [place] [phone] Therapy/tele-psych/case  [facility] [address] [place].   [place], [place] [phone](336) [phone]    Dr. Lolly MustacheArfeen  [phone]   [facility] [place] [place]  [employer]  [facility] 1) [address], [place] 2) [address], [place] 3)  [address], [place] [phone]  [phone]) [phone]   [facility] Child Abuse Hotline [phone]) [phone] or [phone]) [phone] (After Hours)

## 2013-08-02 LAB — GC/CHLAMYDIA PROBE AMP
CT Probe RNA: NEGATIVE
GC Probe RNA: NEGATIVE

## 2013-12-05 ENCOUNTER — Encounter (HOSPITAL_COMMUNITY): Payer: Self-pay | Admitting: Emergency Medicine

## 2014-03-06 ENCOUNTER — Inpatient Hospital Stay (HOSPITAL_COMMUNITY)
Admission: AD | Admit: 2014-03-06 | Discharge: 2014-03-06 | Disposition: A | Discharge status: Home / Self Care | Payer: Medicaid Other | Source: Ambulatory Visit | Attending: Obstetrics and Gynecology | Admitting: Obstetrics and Gynecology

## 2014-03-06 ENCOUNTER — Encounter (HOSPITAL_COMMUNITY): Payer: Self-pay | Admitting: *Deleted

## 2014-03-06 DIAGNOSIS — F17210 Nicotine dependence, cigarettes, uncomplicated: Secondary | ICD-10-CM | POA: Insufficient documentation

## 2014-03-06 DIAGNOSIS — R1031 Right lower quadrant pain: Secondary | ICD-10-CM | POA: Diagnosis not present

## 2014-03-06 DIAGNOSIS — N949 Unspecified condition associated with female genital organs and menstrual cycle: Secondary | ICD-10-CM | POA: Insufficient documentation

## 2014-03-06 DIAGNOSIS — R109 Unspecified abdominal pain: Secondary | ICD-10-CM

## 2014-03-06 LAB — URINALYSIS, ROUTINE W REFLEX MICROSCOPIC
BILIRUBIN URINE: NEGATIVE
Glucose, UA: NEGATIVE mg/dL
HGB URINE DIPSTICK: NEGATIVE
Ketones, ur: NEGATIVE mg/dL
LEUKOCYTES UA: NEGATIVE
Nitrite: NEGATIVE
Protein, ur: NEGATIVE mg/dL
SPECIFIC GRAVITY, URINE: 1.02 (ref 1.005–1.030)
UROBILINOGEN UA: 0.2 mg/dL (ref 0.0–1.0)
pH: 7 (ref 5.0–8.0)

## 2014-03-06 LAB — CBC
HCT: 41.5 % (ref 36.0–46.0)
Hemoglobin: 13.7 g/dL (ref 12.0–15.0)
MCH: 31.3 pg (ref 26.0–34.0)
MCHC: 33 g/dL (ref 30.0–36.0)
MCV: 94.7 fL (ref 78.0–100.0)
Platelets: 180 10*3/uL (ref 150–400)
RBC: 4.38 MIL/uL (ref 3.87–5.11)
RDW: 12.6 % (ref 11.5–15.5)
WBC: 6.4 10*3/uL (ref 4.0–10.5)

## 2014-03-06 LAB — WET PREP, GENITAL
Clue Cells Wet Prep HPF POC: NONE SEEN
Trich, Wet Prep: NONE SEEN
Yeast Wet Prep HPF POC: NONE SEEN

## 2014-03-06 LAB — POCT PREGNANCY, URINE: Preg Test, Ur: NEGATIVE

## 2014-03-06 MED ORDER — IBUPROFEN 800 MG PO TABS: 800.0000 mg | ORAL_TABLET | Freq: Four times a day (QID) | ORAL | Status: DC | PRN

## 2014-03-06 MED ORDER — IBUPROFEN 800 MG PO TABS
800.0000 mg | ORAL_TABLET | Freq: Once | ORAL | Status: AC
  Administered 2014-03-06: 800 mg via ORAL
  Filled 2014-03-06: qty 1

## 2014-03-06 NOTE — MAU Provider Note (Signed)
History    Monique Lyons 28 [id number] presetns to the MAU with c/o sharp pain in her right ovary that has occurred since she had her ceserean section 1 + year ago. She states her last menses were approximately 2 weeks ago. She states that her periods are lighter but more painful since her bilateral tubal ligation.     CSN: [id number]  Arrival date and time: 03/06/14 1202   First Provider Initiated Contact with Patient 03/06/14 1239      Chief Complaint  Patient presents with  . Abdominal Pain   Abdominal Pain This is a chronic problem. The current episode started more than 1 month ago. The onset quality is gradual. The problem occurs daily. The problem has been gradually worsening. The pain is located in the RLQ. The pain is at a severity of 9/10. The quality of the pain is sharp. The abdominal pain does not radiate. Pertinent negatives include no fever, nausea or vomiting. Nothing aggravates the pain. The pain is relieved by nothing. She has tried nothing for the symptoms. Her past medical history is significant for abdominal surgery. C/S      Past Medical History  Diagnosis Date  . Ovarian cyst   . No pertinent past medical history     Past Surgical History  Procedure Laterality Date  . Cesarean section      first was c-section; second vbac  . Cesarean section  December 28, 2003  . Tubal ligation Bilateral 10/17/2012    Procedure: POST PARTUM TUBAL LIGATION bilateral;  Surgeon: Lazaro ArmsLuther H Eure, MD;  Location: [facility];  Service: Gynecology;  Laterality: Bilateral;    Family History  Problem Relation Age of Onset  . Endometriosis Mother   . Heart murmur Mother     History  Substance Use Topics  . Smoking status: Current Every Day Smoker -- 0.50 packs/day    Types: Cigarettes  . Smokeless tobacco: Never Used  . Alcohol Use: Yes     Comment: occas    Allergies:  Allergies  Allergen Reactions  . Amoxicillin Other (See Comments)    Patient believes that her  reaction may be a rash, but not sure.  Her mom told her that she had a reaction to this medication.  Marland Kitchen. Penicillins Rash    Prescriptions prior to admission  Medication Sig Dispense Refill Last Dose  . acetaminophen (TYLENOL) 500 MG tablet Take 1,000 mg by mouth every 6 (six) hours as needed for headache (pain).   Past Month at Unknown time  . Multiple Vitamins-Minerals (ADULT ONE DAILY GUMMIES PO) Take 2 tablets by mouth daily.   Past Week at Unknown time  . metroNIDAZOLE (FLAGYL) 500 MG tablet Take 1 tablet (500 mg total) by mouth 2 (two) times daily. DO NOT DRINK ALCOHOL WHILE TAKING THIS MEDICATION. (Patient not taking: Reported on 03/06/2014) 14 tablet 0 Completed Course at Unknown time    Review of Systems  Constitutional: Negative for fever.  Gastrointestinal: Positive for abdominal pain. Negative for nausea and vomiting.  History obtained from the patient.  Physical Exam   Blood pressure 133/87, pulse 88, temperature 98.6 F (37 C), temperature source Oral, resp. rate 18, height 5\' 9"  (1.753 m), weight 72.213 kg (159 lb 3.2 oz), last menstrual period 02/27/2014.  Physical Exam  Constitutional: She is oriented to person, place, and time. She appears well-developed and well-nourished.  HENT:  Head: Normocephalic and atraumatic.  Neck: Normal range of motion.  Cardiovascular: Normal rate.   Respiratory: Effort  normal.  GI: Soft. There is no tenderness.  Musculoskeletal: Normal range of motion.  Neurological: She is alert and oriented to person, place, and time.  Skin: Skin is warm and dry.  Psychiatric: She has a normal mood and affect. Her behavior is normal. Judgment and thought content normal.  Female genitalia: not done normal external genitalia, vulva, vagina, cervix, uterus and adnexa slightly tender right adnexa   MAU Course  Procedures Results for orders placed or performed during the hospital encounter of 03/06/14 (from the past 24 hour(s))  Urinalysis, Routine w  reflex microscopic     Status: Abnormal   Collection Time: 03/06/14 12:07 PM  Result Value Ref Range   Color, Urine YELLOW YELLOW   APPearance HAZY (A) CLEAR   Specific Gravity, Urine 1.020 1.005 - 1.030   pH 7.0 5.0 - 8.0   Glucose, UA NEGATIVE NEGATIVE mg/dL   Hgb urine dipstick NEGATIVE NEGATIVE   Bilirubin Urine NEGATIVE NEGATIVE   Ketones, ur NEGATIVE NEGATIVE mg/dL   Protein, ur NEGATIVE NEGATIVE mg/dL   Urobilinogen, UA 0.2 0.0 - 1.0 mg/dL   Nitrite NEGATIVE NEGATIVE   Leukocytes, UA NEGATIVE NEGATIVE  Pregnancy, urine POC     Status: None   Collection Time: 03/06/14 12:32 PM  Result Value Ref Range   Preg Test, Ur NEGATIVE NEGATIVE  CBC     Status: None   Collection Time: 03/06/14 12:57 PM  Result Value Ref Range   WBC 6.4 4.0 - 10.5 K/uL   RBC 4.38 3.87 - 5.11 MIL/uL   Hemoglobin 13.7 12.0 - 15.0 g/dL   HCT 28.4 13.2 - 44.0 %   MCV 94.7 78.0 - 100.0 fL   MCH 31.3 26.0 - 34.0 pg   MCHC 33.0 30.0 - 36.0 g/dL   RDW 10.2 72.5 - 36.6 %   Platelets 180 150 - 400 K/uL  Wet prep, genital     Status: Abnormal   Collection Time: 03/06/14  1:21 PM  Result Value Ref Range   Yeast Wet Prep HPF POC NONE SEEN NONE SEEN   Trich, Wet Prep NONE SEEN NONE SEEN   Clue Cells Wet Prep HPF POC NONE SEEN NONE SEEN   WBC, Wet Prep HPF POC MODERATE BACTERIA SEEN (A) NONE SEEN   MDM CBC GC Chlamydia Pelvic Exam  Assessment and Plan  A: Abdominal pain P: [facility] continued pain      RX: for Motrin   Lyons,Monique Grissett 03/06/2014, 1:02 PM

## 2014-03-06 NOTE — Discharge Instructions (Signed)
Abdominal Pain, Women °Abdominal (stomach, pelvic, or belly) pain can be caused by many things. It is important to tell your doctor: °· The location of the pain. °· Does it come and go or is it present all the time? °· Are there things that start the pain (eating certain foods, exercise)? °· Are there other symptoms associated with the pain (fever, nausea, vomiting, diarrhea)? °All of this is helpful to know when trying to find the cause of the pain. °CAUSES  °· Stomach: virus or bacteria infection, or ulcer. °· Intestine: appendicitis (inflamed appendix), regional ileitis (Crohn's disease), ulcerative colitis (inflamed colon), irritable bowel syndrome, diverticulitis (inflamed diverticulum of the colon), or cancer of the stomach or intestine. °· Gallbladder disease or stones in the gallbladder. °· Kidney disease, kidney stones, or infection. °· Pancreas infection or cancer. °· Fibromyalgia (pain disorder). °· Diseases of the female organs: °¨ Uterus: fibroid (non-cancerous) tumors or infection. °¨ Fallopian tubes: infection or tubal pregnancy. °¨ Ovary: cysts or tumors. °¨ Pelvic adhesions (scar tissue). °¨ Endometriosis (uterus lining tissue growing in the pelvis and on the pelvic organs). °¨ Pelvic congestion syndrome (female organs filling up with blood just before the menstrual period). °¨ Pain with the menstrual period. °¨ Pain with ovulation (producing an egg). °¨ Pain with an IUD (intrauterine device, birth control) in the uterus. °¨ Cancer of the female organs. °· Functional pain (pain not caused by a disease, may improve without treatment). °· Psychological pain. °· Depression. °DIAGNOSIS  °Your doctor will decide the seriousness of your pain by doing an examination. °· Blood tests. °· X-rays. °· Ultrasound. °· CT scan (computed tomography, special type of X-ray). °· MRI (magnetic resonance imaging). °· Cultures, for infection. °· Barium enema (dye inserted in the large intestine, to better view it with  X-rays). °· Colonoscopy (looking in intestine with a lighted tube). °· Laparoscopy (minor surgery, looking in abdomen with a lighted tube). °· Major abdominal exploratory surgery (looking in abdomen with a large incision). °TREATMENT  °The treatment will depend on the cause of the pain.  °· Many cases can be observed and treated at home. °· Over-the-counter medicines recommended by your caregiver. °· Prescription medicine. °· Antibiotics, for infection. °· Birth control pills, for painful periods or for ovulation pain. °· Hormone treatment, for endometriosis. °· Nerve blocking injections. °· Physical therapy. °· Antidepressants. °· Counseling with a psychologist or psychiatrist. °· Minor or major surgery. °HOME CARE INSTRUCTIONS  °· Do not take laxatives, unless directed by your caregiver. °· Take over-the-counter pain medicine only if ordered by your caregiver. Do not take aspirin because it can cause an upset stomach or bleeding. °· Try a clear liquid diet (broth or water) as ordered by your caregiver. Slowly move to a bland diet, as tolerated, if the pain is related to the stomach or intestine. °· Have a thermometer and take your temperature several times a day, and record it. °· Bed rest and sleep, if it helps the pain. °· Avoid sexual intercourse, if it causes pain. °· Avoid stressful situations. °· Keep your follow-up appointments and tests, as your caregiver orders. °· If the pain does not go away with medicine or surgery, you may try: °¨ Acupuncture. °¨ Relaxation exercises (yoga, meditation). °¨ Group therapy. °¨ Counseling. °SEEK MEDICAL CARE IF:  °· You notice certain foods cause stomach pain. °· Your home care treatment is not helping your pain. °· You need stronger pain medicine. °· You want your IUD removed. °· You feel faint or   lightheaded.  You develop nausea and vomiting.  You develop a rash.  You are having side effects or an allergy to your medicine. SEEK IMMEDIATE MEDICAL CARE IF:   Your  pain does not go away or gets worse.  You have a fever.  Your pain is felt only in portions of the abdomen. The right side could possibly be appendicitis. The left lower portion of the abdomen could be colitis or diverticulitis.  You are passing blood in your stools (bright red or black tarry stools, with or without vomiting).  You have blood in your urine.  You develop chills, with or without a fever.  You pass out. MAKE SURE YOU:   Understand these instructions.  Will watch your condition.  Will get help right away if you are not doing well or get worse. Document Released: 11/17/2006 Document Revised: 06/06/2013 Document Reviewed: 12/07/2008 [facility] Patient Information 2015 [place], [place]. This information is not intended to replace advice given to you by your health care provider. Make sure you discuss any questions you have with your health care provider.  Abdominal Pain, Women Abdominal (stomach, pelvic, or belly) pain can be caused by many things. It is important to tell your doctor:  The location of the pain.  Does it come and go or is it present all the time?  Are there things that start the pain (eating certain foods, exercise)?  Are there other symptoms associated with the pain (fever, nausea, vomiting, diarrhea)? All of this is helpful to know when trying to find the cause of the pain. CAUSES   Stomach: virus or bacteria infection, or ulcer.  Intestine: appendicitis (inflamed appendix), regional ileitis (Crohn's disease), ulcerative colitis (inflamed colon), irritable bowel syndrome, diverticulitis (inflamed diverticulum of the colon), or cancer of the stomach or intestine.  Gallbladder disease or stones in the gallbladder.  Kidney disease, kidney stones, or infection.  Pancreas infection or cancer.  Fibromyalgia (pain disorder).  Diseases of the female organs:  Uterus: fibroid (non-cancerous) tumors or infection.  Fallopian tubes: infection or tubal  pregnancy.  Ovary: cysts or tumors.  Pelvic adhesions (scar tissue).  Endometriosis (uterus lining tissue growing in the pelvis and on the pelvic organs).  Pelvic congestion syndrome (female organs filling up with blood just before the menstrual period).  Pain with the menstrual period.  Pain with ovulation (producing an egg).  Pain with an IUD (intrauterine device, birth control) in the uterus.  Cancer of the female organs.  Functional pain (pain not caused by a disease, may improve without treatment).  Psychological pain.  Depression. DIAGNOSIS  Your doctor will decide the seriousness of your pain by doing an examination.  Blood tests.  X-rays.  Ultrasound.  CT scan (computed tomography, special type of X-ray).  MRI (magnetic resonance imaging).  Cultures, for infection.  Barium enema (dye inserted in the large intestine, to better view it with X-rays).  Colonoscopy (looking in intestine with a lighted tube).  Laparoscopy (minor surgery, looking in abdomen with a lighted tube).  Major abdominal exploratory surgery (looking in abdomen with a large incision). TREATMENT  The treatment will depend on the cause of the pain.   Many cases can be observed and treated at home.  Over-the-counter medicines recommended by your caregiver.  Prescription medicine.  Antibiotics, for infection.  Birth control pills, for painful periods or for ovulation pain.  Hormone treatment, for endometriosis.  Nerve blocking injections.  Physical therapy.  Antidepressants.  Counseling with a psychologist or psychiatrist.  Minor or  major surgery. °HOME CARE INSTRUCTIONS  °· Do not take laxatives, unless directed by your caregiver. °· Take over-the-counter pain medicine only if ordered by your caregiver. Do not take aspirin because it can cause an upset stomach or bleeding. °· Try a clear liquid diet (broth or water) as ordered by your caregiver. Slowly move to a bland diet, as  tolerated, if the pain is related to the stomach or intestine. °· Have a thermometer and take your temperature several times a day, and record it. °· Bed rest and sleep, if it helps the pain. °· Avoid sexual intercourse, if it causes pain. °· Avoid stressful situations. °· Keep your follow-up appointments and tests, as your caregiver orders. °· If the pain does not go away with medicine or surgery, you may try: °¨ Acupuncture. °¨ Relaxation exercises (yoga, meditation). °¨ Group therapy. °¨ Counseling. °SEEK MEDICAL CARE IF:  °· You notice certain foods cause stomach pain. °· Your home care treatment is not helping your pain. °· You need stronger pain medicine. °· You want your IUD removed. °· You feel faint or lightheaded. °· You develop nausea and vomiting. °· You develop a rash. °· You are having side effects or an allergy to your medicine. °SEEK IMMEDIATE MEDICAL CARE IF:  °· Your pain does not go away or gets worse. °· You have a fever. °· Your pain is felt only in portions of the abdomen. The right side could possibly be appendicitis. The left lower portion of the abdomen could be colitis or diverticulitis. °· You are passing blood in your stools (bright red or black tarry stools, with or without vomiting). °· You have blood in your urine. °· You develop chills, with or without a fever. °· You pass out. °MAKE SURE YOU:  °· Understand these instructions. °· Will watch your condition. °· Will get help right away if you are not doing well or get worse. °Document Released: 11/17/2006 Document Revised: 06/06/2013 Document Reviewed: 12/07/2008 °[employer]® Patient Information ©2015 [employer]. This information is not intended to replace advice given to you by your health care provider. Make sure you discuss any questions you have with your health care provider. ° °

## 2014-03-06 NOTE — MAU Note (Signed)
Pt presents complaining of "pain in my ovaries." States the pain is worse on the right side and has been going on for months. States she feels very tired all of the time. Denies vaginal bleeding or discharge. Had tubal ligation last year.

## 2014-03-06 NOTE — MAU Note (Signed)
Called but not in lobby 

## 2014-03-07 LAB — GC/CHLAMYDIA PROBE AMP (~~LOC~~) NOT AT ARMC
Chlamydia: NEGATIVE
Neisseria Gonorrhea: NEGATIVE

## 2014-04-06 ENCOUNTER — Ambulatory Visit (INDEPENDENT_AMBULATORY_CARE_PROVIDER_SITE_OTHER): Payer: Medicaid Other | Admitting: Family

## 2014-04-06 VITALS — BP 140/77 | HR 83 | Temp 98.0°F | Ht 69.0 in | Wt 131.2 lb

## 2014-04-06 DIAGNOSIS — Z98891 History of uterine scar from previous surgery: Secondary | ICD-10-CM

## 2014-04-06 DIAGNOSIS — R102 Pelvic and perineal pain: Secondary | ICD-10-CM

## 2014-04-06 LAB — WET PREP, GENITAL
Trich, Wet Prep: NONE SEEN
Yeast Wet Prep HPF POC: NONE SEEN

## 2014-04-06 NOTE — Progress Notes (Signed)
Subjective:     Patient ID: Monique Lyons, female   DOB: 06-13-1985, 29 y.o.   MRN: [id number]  HPI Pt is a 29 yo patient here today c/o right sided pelvic pain. Reports pain is stabbing in nature, mainly when she is walking or having sex. States pain started 10 years ago and was on and off and has progressively gotten worse within the last few months. Hx of ovarian cyst when she was 18. LMP 03/16/14.  Birth control method bilateral tubal ligation.    Menstrual cycle occurs every 4-6 weeks lasting approximately 4 days with a light flow.  Report clear vaginal discharge.  No odor or itching.    Pt also here with report of numbness in right hand and left foot.  Also report feeling pin pricks in right leg.  All these symptoms have been occuring for past 3-4 months.     Review of Systems  Genitourinary: Positive for vaginal discharge (clear discharge) and pelvic pain.  Neurological: Positive for numbness (right hand). Negative for dizziness and facial asymmetry.       Objective:   Physical Exam  Constitutional: She is oriented to person, place, and time. She appears well-developed and well-nourished. No distress.  HENT:  Head: Normocephalic.  Neck: Normal range of motion. Neck supple. No thyromegaly present.  Cardiovascular: Normal rate, regular rhythm and normal heart sounds.   Pulmonary/Chest: Effort normal and breath sounds normal.  Abdominal: Soft. Bowel sounds are normal. There is no tenderness ( ).  Genitourinary: Uterus is not enlarged. Cervix exhibits no motion tenderness and no discharge. Right adnexum displays mass (enlarged right ovary) and tenderness. Left adnexum displays no mass, no tenderness and no fullness. No bleeding in the vagina.  Musculoskeletal: Normal range of motion.  Neurological: She is alert and oriented to person, place, and time.  Skin: Skin is warm and dry. No pallor.       Assessment:     Right Pelvic Pain Extremity Numbness     Plan:     Schedule  outpatient pelvic ultrasound Continue ibuprofen for pain Wet prep collected Follow-up in 4 weeks     Benigno Check Kennith GainN Karim, CNM

## 2014-04-06 NOTE — Progress Notes (Signed)
Patient here today c/o right sided pelvic pain. Reports pain is stabbing in nature, mainly when she is walking or having sex. States pain started 10 years ago and was on and off and has progressively gotten worse within the last few months. Hx of ovarian cyst when she was 18.

## 2014-04-06 NOTE — Patient Instructions (Signed)
Primary Care Resources: ° °  [facility]  °[address] [place] [address] Ph [phone] °Every 2nd Saturday 9am-12pm  °[email or web address] °FREE Services ° °  [facility]  °[address] [place] [phone]  °[address] [place] [address] [place] [phone]  °[email or web address] °$45 per visit/Walk-in only ° °  [facility]  °[address] [place] [address] Ph [phone]  °1st & 3rd Saturday of each month 9:30am-12:30pm °[email or web address] °Sliding fee scale/Call to make an appointment ° °  [facility]  °[address] [place] [phone]  °Hours Mon-Fri 9am-7pm & Sat 9am-1pm °[email or web address] °Visits start at $45 per visit/Call to make an appointment ° °  [facility] [place]  °[address] [place]  [address] Ph [phone]  °Hours Mon-Wed 8:30am-5pm & Thurs 8:30am-8pm °$5 per visit/Call for an eligibility appointment ° ° ° °

## 2014-04-20 ENCOUNTER — Ambulatory Visit (HOSPITAL_COMMUNITY): Payer: Medicaid Other | Attending: Family

## 2014-05-10 ENCOUNTER — Ambulatory Visit: Payer: Medicaid Other | Admitting: Obstetrics & Gynecology

## 2014-08-08 ENCOUNTER — Emergency Department (HOSPITAL_COMMUNITY)
Admission: EM | Admit: 2014-08-08 | Discharge: 2014-08-08 | Disposition: A | Discharge status: Home / Self Care | Payer: Medicaid Other | Attending: Emergency Medicine | Admitting: Emergency Medicine

## 2014-08-08 ENCOUNTER — Emergency Department (HOSPITAL_COMMUNITY): Payer: Medicaid Other

## 2014-08-08 ENCOUNTER — Encounter (HOSPITAL_COMMUNITY): Payer: Self-pay | Admitting: *Deleted

## 2014-08-08 DIAGNOSIS — R1031 Right lower quadrant pain: Secondary | ICD-10-CM | POA: Diagnosis not present

## 2014-08-08 DIAGNOSIS — Z8742 Personal history of other diseases of the female genital tract: Secondary | ICD-10-CM | POA: Insufficient documentation

## 2014-08-08 DIAGNOSIS — Z880 Allergy status to penicillin: Secondary | ICD-10-CM | POA: Insufficient documentation

## 2014-08-08 DIAGNOSIS — Z720 Tobacco use: Secondary | ICD-10-CM | POA: Insufficient documentation

## 2014-08-08 LAB — CBC WITH DIFFERENTIAL/PLATELET
BASOS PCT: 0 % (ref 0–1)
Basophils Absolute: 0 10*3/uL (ref 0.0–0.1)
EOS PCT: 2 % (ref 0–5)
Eosinophils Absolute: 0.1 10*3/uL (ref 0.0–0.7)
HCT: 41.8 % (ref 36.0–46.0)
HEMOGLOBIN: 13.7 g/dL (ref 12.0–15.0)
Lymphocytes Relative: 34 % (ref 12–46)
Lymphs Abs: 1.6 10*3/uL (ref 0.7–4.0)
MCH: 30.8 pg (ref 26.0–34.0)
MCHC: 32.8 g/dL (ref 30.0–36.0)
MCV: 93.9 fL (ref 78.0–100.0)
MONO ABS: 0.3 10*3/uL (ref 0.1–1.0)
MONOS PCT: 6 % (ref 3–12)
NEUTROS ABS: 2.7 10*3/uL (ref 1.7–7.7)
Neutrophils Relative %: 58 % (ref 43–77)
Platelets: 195 10*3/uL (ref 150–400)
RBC: 4.45 MIL/uL (ref 3.87–5.11)
RDW: 12.5 % (ref 11.5–15.5)
WBC: 4.7 10*3/uL (ref 4.0–10.5)

## 2014-08-08 LAB — WET PREP, GENITAL
Clue Cells Wet Prep HPF POC: NONE SEEN
Trich, Wet Prep: NONE SEEN
Yeast Wet Prep HPF POC: NONE SEEN

## 2014-08-08 LAB — I-STAT BETA HCG BLOOD, ED (MC, WL, AP ONLY): I-stat hCG, quantitative: <5 m[IU]/mL (ref <5)

## 2014-08-08 LAB — COMPREHENSIVE METABOLIC PANEL
ALT: 17 U/L (ref 14–54)
ANION GAP: 6 (ref 5–15)
AST: 15 U/L (ref 15–41)
Albumin: 3.7 g/dL (ref 3.5–5.0)
Alkaline Phosphatase: 49 U/L (ref 38–126)
BUN: 7 mg/dL (ref 6–20)
CO2: 24 mmol/L (ref 22–32)
Calcium: 8.5 mg/dL — ABNORMAL LOW (ref 8.9–10.3)
Chloride: 108 mmol/L (ref 101–111)
Creatinine, Ser: 0.57 mg/dL (ref 0.44–1.00)
GFR calc Af Amer: >60 mL/min (ref >60)
GFR calc non Af Amer: >60 mL/min (ref >60)
Glucose, Bld: 97 mg/dL (ref 65–99)
Potassium: 4.2 mmol/L (ref 3.5–5.1)
SODIUM: 138 mmol/L (ref 135–145)
Total Bilirubin: 0.3 mg/dL (ref 0.3–1.2)
Total Protein: 6.2 g/dL — ABNORMAL LOW (ref 6.5–8.1)

## 2014-08-08 LAB — LIPASE, BLOOD: Lipase: 20 U/L — ABNORMAL LOW (ref 22–51)

## 2014-08-08 MED ORDER — OXYCODONE-ACETAMINOPHEN 5-325 MG PO TABS
1.0000 | ORAL_TABLET | Freq: Once | ORAL | Status: AC
  Administered 2014-08-08: 1 via ORAL
  Filled 2014-08-08: qty 1

## 2014-08-08 MED ORDER — HYDROCODONE-ACETAMINOPHEN 5-325 MG PO TABS: 1.0000 | ORAL_TABLET | ORAL | Status: DC | PRN

## 2014-08-08 NOTE — Discharge Instructions (Signed)
Abdominal Pain, Women °Abdominal (stomach, pelvic, or belly) pain can be caused by many things. It is important to tell your doctor: °· The location of the pain. °· Does it come and go or is it present all the time? °· Are there things that start the pain (eating certain foods, exercise)? °· Are there other symptoms associated with the pain (fever, nausea, vomiting, diarrhea)? °All of this is helpful to know when trying to find the cause of the pain. °CAUSES  °· Stomach: virus or bacteria infection, or ulcer. °· Intestine: appendicitis (inflamed appendix), regional ileitis (Crohn's disease), ulcerative colitis (inflamed colon), irritable bowel syndrome, diverticulitis (inflamed diverticulum of the colon), or cancer of the stomach or intestine. °· Gallbladder disease or stones in the gallbladder. °· Kidney disease, kidney stones, or infection. °· Pancreas infection or cancer. °· Fibromyalgia (pain disorder). °· Diseases of the female organs: °¨ Uterus: fibroid (non-cancerous) tumors or infection. °¨ Fallopian tubes: infection or tubal pregnancy. °¨ Ovary: cysts or tumors. °¨ Pelvic adhesions (scar tissue). °¨ Endometriosis (uterus lining tissue growing in the pelvis and on the pelvic organs). °¨ Pelvic congestion syndrome (female organs filling up with blood just before the menstrual period). °¨ Pain with the menstrual period. °¨ Pain with ovulation (producing an egg). °¨ Pain with an IUD (intrauterine device, birth control) in the uterus. °¨ Cancer of the female organs. °· Functional pain (pain not caused by a disease, may improve without treatment). °· Psychological pain. °· Depression. °DIAGNOSIS  °Your doctor will decide the seriousness of your pain by doing an examination. °· Blood tests. °· X-rays. °· Ultrasound. °· CT scan (computed tomography, special type of X-ray). °· MRI (magnetic resonance imaging). °· Cultures, for infection. °· Barium enema (dye inserted in the large intestine, to better view it with  X-rays). °· Colonoscopy (looking in intestine with a lighted tube). °· Laparoscopy (minor surgery, looking in abdomen with a lighted tube). °· Major abdominal exploratory surgery (looking in abdomen with a large incision). °TREATMENT  °The treatment will depend on the cause of the pain.  °· Many cases can be observed and treated at home. °· Over-the-counter medicines recommended by your caregiver. °· Prescription medicine. °· Antibiotics, for infection. °· Birth control pills, for painful periods or for ovulation pain. °· Hormone treatment, for endometriosis. °· Nerve blocking injections. °· Physical therapy. °· Antidepressants. °· Counseling with a psychologist or psychiatrist. °· Minor or major surgery. °HOME CARE INSTRUCTIONS  °· Do not take laxatives, unless directed by your caregiver. °· Take over-the-counter pain medicine only if ordered by your caregiver. Do not take aspirin because it can cause an upset stomach or bleeding. °· Try a clear liquid diet (broth or water) as ordered by your caregiver. Slowly move to a bland diet, as tolerated, if the pain is related to the stomach or intestine. °· Have a thermometer and take your temperature several times a day, and record it. °· Bed rest and sleep, if it helps the pain. °· Avoid sexual intercourse, if it causes pain. °· Avoid stressful situations. °· Keep your follow-up appointments and tests, as your caregiver orders. °· If the pain does not go away with medicine or surgery, you may try: °¨ Acupuncture. °¨ Relaxation exercises (yoga, meditation). °¨ Group therapy. °¨ Counseling. °SEEK MEDICAL CARE IF:  °· You notice certain foods cause stomach pain. °· Your home care treatment is not helping your pain. °· You need stronger pain medicine. °· You want your IUD removed. °· You feel faint or   lightheaded. °· You develop nausea and vomiting. °· You develop a rash. °· You are having side effects or an allergy to your medicine. °SEEK IMMEDIATE MEDICAL CARE IF:  °· Your  pain does not go away or gets worse. °· You have a fever. °· Your pain is felt only in portions of the abdomen. The right side could possibly be appendicitis. The left lower portion of the abdomen could be colitis or diverticulitis. °· You are passing blood in your stools (bright red or black tarry stools, with or without vomiting). °· You have blood in your urine. °· You develop chills, with or without a fever. °· You pass out. °MAKE SURE YOU:  °· Understand these instructions. °· Will watch your condition. °· Will get help right away if you are not doing well or get worse. °Document Released: 11/17/2006 Document Revised: 06/06/2013 Document Reviewed: 12/07/2008 °ExitCare® Patient Information ©2015 [employer]. This information is not intended to replace advice given to you by your health care provider. Make sure you discuss any questions you have with your health care provider. ° °Emergency Department Resource Guide °1) Find a Doctor and Pay Out of Pocket °Although you won't have to find out who is covered by your insurance plan, it is a good idea to ask around and get recommendations. You will then need to call the office and see if the doctor you have chosen will accept you as a new patient and what types of options they offer for patients who are self-pay. Some doctors offer discounts or will set up payment plans for their patients who do not have insurance, but you will need to ask so you aren't surprised when you get to your appointment. ° °2) Contact Your Local Health Department °Not all health departments have doctors that can see patients for sick visits, but many do, so it is worth a call to see if yours does. If you don't know where your local health department is, you can check in your phone book. The CDC also has a tool to help you locate your state's health department, and many state websites also have listings of all of their local health departments. ° °3) Find a Walk-in Clinic °If your illness is  not likely to be very severe or complicated, you may want to try a walk in clinic. These are popping up all over the country in pharmacies, drugstores, and shopping centers. They're usually staffed by nurse practitioners or physician assistants that have been trained to treat common illnesses and complaints. They're usually fairly quick and inexpensive. However, if you have serious medical issues or chronic medical problems, these are probably not your best option. ° °No Primary Care Doctor: °- Call [facility] at  832-8000 - they can help you locate a primary care doctor that  accepts your insurance, provides certain services, etc. °- Physician Referral Service- 1-800-533-3463 ° °Chronic Pain Problems: °Organization         Address  Phone   Notes  °[facility]  (336) 297-2271 Patients need to be referred by their primary care doctor.  ° °Medication Assistance: °Organization         Address  Phone   Notes  °[place] [facility] [address] °[place], [place] [address] (336) 641-8030 --Must be a resident of [place] °-- Must have NO insurance coverage whatsoever (no Medicaid/ Medicare, etc.) °-- The pt. MUST have a primary care doctor that directs their care regularly   and follows them in the community °  °[facility]  (866) 331-1348   °[employer]  (888) 892-1162   ° °Agencies that provide inexpensive medical care: °Organization         Address  Phone   Notes  °[facility]  (336) 832-8035   °[facility]    (336) 832-7272   °[facility] [address] °[place], [place] [address] (336) 832-4777   °[facility] [place] [address], °[place] (336) 271-4999   °[facility]    (336) 373-0678   °[facility]    (336) 272-1050   °[facility] ° [address], [place] Phone:  (336) 832-4444, Fax:  (336) 832-4440 Hours of Operation:  9 am - 6  pm, M-F.  Also accepts Medicaid/Medicare and self-pay.  °[facility] ° [address], [place] Phone: (336) 832-3150, Fax: (336) [phone]. Hours of Operation:  8:30 am - 5:30 pm, M-F.  Also accepts Medicaid and self-pay.  °[facility] [place] [address], [place] Phone: (336) 878-6027   °[facility] [address], [place], [place] (336)723-1848, Ext. 123 Mondays & Thursdays: 7-9 AM.  First 15 patients are seen on a first come, first serve basis. °  ° °Medicaid-accepting [place] Providers: ° °Organization         Address  Phone   Notes  °[facility] [address], [place] (336) 641-2100 Also accepts self-pay patients.  °[facility] [address], [place] ° (336) 856-9996   °[facility] [address], [place] (336) 288-8857   °[facility] [address] [place] [address], [place] (336) 299-7000   °Veita Bland [address], [place]  ° (336) 373-1557 Only accepts [place] Access Medicaid patients after they have their name applied to their card.  ° °Self-Pay (no insurance) in [place]: ° °Organization         Address  Phone   Notes  °Sickle Cell Patients, [facility] [address], [place] (336) 832-1970   °[facility] [address], [place] (336) 832-4400   °[facility] ° [address], [place] (336) 992-4800   °[facility]/Dr. Osei-Bonsu ° [address] [place] [address], [place] or [address], [place] (336) 841-8500 Phone number for both [place] and [place] locations is the same.  °[facility] [address], [place] (336) 299-0000   °[facility] [address] [place] [address], [place] or [address] (336) 852-7530 °(336) 878-2260   °[facility] [address], [place] (336) 350-1642, phone; (336) 294-5005, fax Sees patients 1st and 3rd Saturday of every month.  Must not qualify for public or private insurance (i.e. Medicaid, Medicare, [place] Health Choice, Veterans' Benefits) • Household income should be no more than 200% of the poverty level •The clinic cannot treat you if you are pregnant or think you are pregnant • Sexually transmitted diseases are not treated at the clinic.  ° ° °Dental Care: °Organization         Address  Phone  Notes  °[facility] [address], [place] (336) 641-6152 Accepts children up to age 21 who   are enrolled in Medicaid or [place] Health Choice; pregnant women with a Medicaid card; and children who have applied for Medicaid or [place] Health Choice, but were declined, whose parents can pay a reduced fee at time of service.  °[facility] [place]  [address], [place] (336) 641-7733 Accepts children up to age 21 who are enrolled in Medicaid or [place] Health Choice; pregnant women with a Medicaid card; and children who have applied for Medicaid or [place] Health Choice, but were declined, whose parents can pay a reduced fee at time of service.  °[facility] ° [address], [place] (336) 641-4533 Patients are seen by appointment only. Walk-ins are not accepted. [facility] will see patients 18 years of age and older. °Monday - Tuesday (8am-5pm) °Most Wednesdays (8:30-5pm) °$30 per visit, cash only  °[facility] ° [address], [place] (336) 641-4533 Patients are seen by appointment only. Walk-ins are not accepted. [facility] will see patients 18 years of age and older. °One Wednesday Evening (Monthly: Volunteer Based).  $30 per visit, cash only  °[facility]  (919) 537-3737 for adults; Children under age 4, call Graduate Pediatric Dentistry at ([phone]  [phone]. Children aged 4-14, please call ([phone]) [phone] to request a pediatric application. ° Dental services are provided in all areas of dental care including fillings, crowns and bridges, complete and partial dentures, implants, gum treatment, root canals, and extractions. Preventive care is also provided. Treatment is provided to both adults and children. °Patients are selected via a lottery and there is often a waiting list. °  °[facility] [address], °[place] ° (336) 763-8833 [email or web address] °  °[facility] [address], [place], [place] (336)723-1848, Ext. 123 Second and Fourth Thursday of each month, opens at 6:30 AM; Clinic ends at 9 AM.  Patients are seen on a first-come first-served basis, and a limited number are seen during each clinic.  ° °[facility] ° [address], [place], [place] (336) 723-7904   Eligibility Requirements °You must have lived in [place], [place], or [place] counties for at least the last three months. °  You cannot be eligible for state or federal sponsored healthcare insurance, including Veterans Administration, Medicaid, or Medicare. °  You generally cannot be eligible for healthcare insurance through your employer.  °  How to apply: °Eligibility screenings are held every Tuesday and Wednesday afternoon from 1:00 pm until 4:00 pm. You do not need an appointment for the interview!  °[facility] [address], [place], [place] 336-631-2330   °[facility]  336-342-8273   °[facility]  336-703-3100   °[facility]  336-570-6415   ° °Behavioral Health Resources in the Community: °Intensive Outpatient Programs °Organization         Address  Phone  Notes  °[facility] [address], [place], [place] 336-878-6098   °[facility] [address], [place], [place] 336-832-9800   °[facility]  [address], [place], [place] ° 336-882-2125   °[facility] [address],  °,  1-800-853-5163 or 336-641-4981   °Substance Abuse Resources °Organization         Address  Phone  Notes  °[facility]  336-882-2125   °[facility]  336-784-9470   °  [facility]  336-285-9073   °[facility]  336-845-3988   °Residential & Outpatient Substance Abuse Program  1-800-659-3381   °Psychological Services °Organization         Address  Phone  Notes  °[facility]  336- 832-9600   °[facility]  336- 378-7881   °[facility] [address], [place] 1-800-853-5163 or 336-641-4981   ° °Mobile Crisis Teams °Organization         Address  Phone  Notes  °[facility], Mobile Crisis Care Unit  1-877-626-1772   °Assertive °[facility] ° [address] [place], [place] 336-834-9664   °Sharon DeEsch [address] °[place] 336-554-5454   ° °Self-Help/Support Groups °Organization         Address  Phone             Notes  °[facility] [place] - variety of support groups  336- 373-1402 Call for more information  °Narcotics Anonymous (NA), [facility] [address], °[place]  2 meetings at this location  ° °Residential Treatment Programs °Organization         Address  Phone  Notes  °[facility] [address],    °[place]  1-866-801-8205   °[facility] ° [address], [place], [place] 704-293-8524   °[facility] [address], [place] 336-845-3988 Admissions: 8am-3pm M-F  °[facility] [address]    °[place], [place] 336-841-1104   °[facility] [address], [place], [place] 336-379-7146   °[facility] [address]  °[place], [place] 336-285-9073   °[facility] Intensive Outpatient [address], [place], [place]  336-852-3033   °[facility] [address]  °[place], [place] 1-877-615-2722 or 336-784-9470   °[facility] [address] [place], [place] 336-227-7417 Accepts Medicaid  °[facility] [address]  °[place] 1-800-659-3381 Substance Abuse/Addiction Treatment  ° °[place] Behavioral Health Resources °Organization         Address  Phone  Notes  °[facility]  (888) 581-9988   °Julie Brannon, PhD [address] [place], [place]   (336) 349-5553 or (336) 951-0000   °[facility]   [address] °[place], [place] (336) 349-4454   °[facility] [address], [place], [place] (336) 342-8316 Insurance/Medicaid/sponsorship through [facility]  °[facility] [address]                                    [place], [place] (336) 342-8316 Therapy/tele-psych/case  °[facility] [address]  ° [place], [place] (336) 349-2233    °Dr. Arfeen  (336) 349-4544   °[facility] [place]  [employer] [place] [facility] 1) [address], [place] °2) [address], [place] °3)  [address], [place] (336) 349-3220 °(336) 342-7768 ° °(336) 342-8140   °[place] [facility] (336) 342-1394 or (336) 342-3537 (After Hours)    ° ° °

## 2014-08-08 NOTE — ED Notes (Signed)
Patient returned from US.

## 2014-08-08 NOTE — ED Notes (Signed)
Pt states that she has RLQ pain and tenderness. Pt states that it feels like her "ovary is on fire". pt has hx of ovarian cyst. Pt was scheduled for an ultrasound but did not go.

## 2014-08-08 NOTE — ED Notes (Signed)
Pt placed in a gown and hooked up to the monitor with the BP cuff and pulse ox 

## 2014-08-08 NOTE — ED Provider Notes (Signed)
CSN: [id number]     Arrival date & time 08/08/14  1044 History   First MD Initiated Contact with Patient 08/08/14 1233     Chief Complaint  Patient presents with  . Abdominal Pain     (Consider location/radiation/quality/duration/timing/severity/associated sxs/prior Treatment) HPI Comments: Pt comes in with ongoing rlq pain that has been going on for several months. She states [facility] had an us scheduled that she missed because she was having to much pain. Denies fever or vomiting. Hasn't been taking anything for the symptoms. Denies dysuria or vaginal discharge.   The history is provided by the patient. No language interpreter was used.    Past Medical History  Diagnosis Date  . Ovarian cyst   . No pertinent past medical history    Past Surgical History  Procedure Laterality Date  . Cesarean section      first was c-section; second vbac  . Cesarean section  December 28, 2003  . Tubal ligation Bilateral 10/17/2012    Procedure: POST PARTUM TUBAL LIGATION bilateral;  Surgeon: Lazaro ArmsLuther H Eure, MD;  Location: [facility];  Service: Gynecology;  Laterality: Bilateral;   Family History  Problem Relation Age of Onset  . Endometriosis Mother   . Heart murmur Mother    History  Substance Use Topics  . Smoking status: Current Every Day Smoker -- 0.50 packs/day    Types: Cigarettes  . Smokeless tobacco: Never Used  . Alcohol Use: Yes     Comment: occas   OB History    Gravida Para Term Preterm AB TAB SAB Ectopic Multiple Living   5 4 4  1     4      Review of Systems  All other systems reviewed and are negative.     Allergies  Amoxicillin and Penicillins  Home Medications   Prior to Admission medications   Medication Sig Start Date End Date Taking? Authorizing Provider  acetaminophen (TYLENOL) 500 MG tablet Take 1,000 mg by mouth every 6 (six) hours as needed for headache (pain).    Historical Provider, MD  ibuprofen (ADVIL,MOTRIN) 800 MG tablet Take 1  tablet (800 mg total) by mouth every 6 (six) hours as needed for moderate pain. 03/06/14   Lori A [name], CNM  Multiple Vitamins-Minerals (ADULT ONE DAILY GUMMIES PO) Take 2 tablets by mouth daily.    Historical Provider, MD   BP 119/83 mmHg  Pulse 87  Temp(Src) 98.4 F (36.9 C) (Oral)  Resp 18  SpO2 97%  LMP 08/03/2014 Physical Exam  Constitutional: She is oriented to person, place, and time. She appears well-developed and well-nourished.  Cardiovascular: Normal rate and regular rhythm.   Pulmonary/Chest: Effort normal and breath sounds normal.  Abdominal: Soft. Bowel sounds are normal. There is no tenderness.  Genitourinary:  -discharge or cmt tenderness. No adnexal tenderness noted  Musculoskeletal: Normal range of motion.  Neurological: She is alert and oriented to person, place, and time.  Skin: Skin is warm and dry.  Nursing note and vitals reviewed.   ED Course  Procedures (including critical care time) Labs Review Labs Reviewed  WET PREP, GENITAL - Abnormal; Notable for the following:    WBC, Wet Prep HPF POC TOO NUMEROUS TO COUNT (*)    All other components within normal limits  COMPREHENSIVE METABOLIC PANEL - Abnormal; Notable for the following:    Calcium 8.5 (*)    Total Protein 6.2 (*)    All other components within normal limits  LIPASE, BLOOD - Abnormal; Notable for the following:    Lipase 20 (*)    All other components within normal limits  CBC WITH DIFFERENTIAL/PLATELET  HIV ANTIBODY (ROUTINE TESTING)  I-STAT BETA HCG BLOOD, ED ([facility], [facility], [facility] ONLY)  GC/CHLAMYDIA PROBE AMP () NOT AT [facility]    Imaging Review US Transvaginal Non-ob  08/08/2014   CLINICAL DATA:  Patient with right lower quadrant pain.  EXAM: TRANSABDOMINAL AND TRANSVAGINAL ULTRASOUND OF PELVIS  TECHNIQUE: Both transabdominal and transvaginal ultrasound examinations of the pelvis were performed. Transabdominal technique was performed for global imaging of the pelvis including uterus,  ovaries, adnexal regions, and pelvic cul-de-sac. It was necessary to proceed with endovaginal exam following the transabdominal exam to visualize the adnexal structures.  COMPARISON:  Pelvic ultrasound 07/30/2012  FINDINGS: Uterus  Measurements: 6.8 x 3.4 x 3.9 cm. No fibroids or other mass visualized.  Endometrium  Thickness: 5.5 mm.  No focal abnormality visualized.  Right ovary  Measurements: 2.6 x 1.6 x 2.0 cm. Normal appearance/no adnexal mass.  Left ovary  Measurements: 2.5 x 2.2 x 2.3 cm. There is a 1.8 x 1.5 x 1.8 cm simple cyst within the left ovary.  Other findings  No free fluid.  IMPRESSION: No acute process within the pelvis.   Electronically Signed   By: Annia Belt M.D.   On: 08/08/2014 14:06   US Pelvis Complete  08/08/2014   CLINICAL DATA:  Patient with right lower quadrant pain.  EXAM: TRANSABDOMINAL AND TRANSVAGINAL ULTRASOUND OF PELVIS  TECHNIQUE: Both transabdominal and transvaginal ultrasound examinations of the pelvis were performed. Transabdominal technique was performed for global imaging of the pelvis including uterus, ovaries, adnexal regions, and pelvic cul-de-sac. It was necessary to proceed with endovaginal exam following the transabdominal exam to visualize the adnexal structures.  COMPARISON:  Pelvic ultrasound 07/30/2012  FINDINGS: Uterus  Measurements: 6.8 x 3.4 x 3.9 cm. No fibroids or other mass visualized.  Endometrium  Thickness: 5.5 mm.  No focal abnormality visualized.  Right ovary  Measurements: 2.6 x 1.6 x 2.0 cm. Normal appearance/no adnexal mass.  Left ovary  Measurements: 2.5 x 2.2 x 2.3 cm. There is a 1.8 x 1.5 x 1.8 cm simple cyst within the left ovary.  Other findings  No free fluid.  IMPRESSION: No acute process within the pelvis.   Electronically Signed   By: Annia Belt M.D.   On: 08/08/2014 14:06     EKG Interpretation None      MDM   Final diagnoses:  RLQ abdominal pain    No cyst noted. Doubt appendicitis based on length of time. Pt given  hydrocodone for pain.    Teressa Lower, NP 08/08/14 1514  Benjiman Core, MD 08/10/14 [phone]

## 2014-08-08 NOTE — ED Notes (Signed)
Patient transported to Ultrasound 

## 2014-08-09 LAB — GC/CHLAMYDIA PROBE AMP (~~LOC~~) NOT AT ARMC
Chlamydia: NEGATIVE
Neisseria Gonorrhea: NEGATIVE

## 2014-08-09 LAB — HIV ANTIBODY (ROUTINE TESTING W REFLEX): HIV Screen 4th Generation wRfx: NONREACTIVE

## 2014-09-04 ENCOUNTER — Inpatient Hospital Stay (HOSPITAL_COMMUNITY): Admission: EM | Admit: 2014-09-04 | Payer: Medicaid Other | Source: Intra-hospital | Admitting: Psychiatry

## 2014-09-04 ENCOUNTER — Encounter (HOSPITAL_COMMUNITY): Payer: Self-pay | Admitting: Emergency Medicine

## 2014-09-04 ENCOUNTER — Emergency Department (HOSPITAL_COMMUNITY)
Admission: EM | Admit: 2014-09-04 | Discharge: 2014-09-04 | Disposition: A | Discharge status: Home / Self Care | Payer: Medicaid Other | Attending: Emergency Medicine | Admitting: Emergency Medicine

## 2014-09-04 DIAGNOSIS — Z720 Tobacco use: Secondary | ICD-10-CM | POA: Diagnosis not present

## 2014-09-04 DIAGNOSIS — F329 Major depressive disorder, single episode, unspecified: Secondary | ICD-10-CM | POA: Insufficient documentation

## 2014-09-04 DIAGNOSIS — Z8742 Personal history of other diseases of the female genital tract: Secondary | ICD-10-CM | POA: Insufficient documentation

## 2014-09-04 DIAGNOSIS — Z880 Allergy status to penicillin: Secondary | ICD-10-CM | POA: Insufficient documentation

## 2014-09-04 DIAGNOSIS — R45851 Suicidal ideations: Secondary | ICD-10-CM | POA: Diagnosis present

## 2014-09-04 DIAGNOSIS — Z79899 Other long term (current) drug therapy: Secondary | ICD-10-CM | POA: Insufficient documentation

## 2014-09-04 DIAGNOSIS — F32A Depression, unspecified: Secondary | ICD-10-CM

## 2014-09-04 LAB — CBC
HEMATOCRIT: 40.8 % (ref 36.0–46.0)
Hemoglobin: 13.4 g/dL (ref 12.0–15.0)
MCH: 30.9 pg (ref 26.0–34.0)
MCHC: 32.8 g/dL (ref 30.0–36.0)
MCV: 94.2 fL (ref 78.0–100.0)
Platelets: 208 10*3/uL (ref 150–400)
RBC: 4.33 MIL/uL (ref 3.87–5.11)
RDW: 12.3 % (ref 11.5–15.5)
WBC: 6.9 10*3/uL (ref 4.0–10.5)

## 2014-09-04 LAB — I-STAT BETA HCG BLOOD, ED (MC, WL, AP ONLY)

## 2014-09-04 LAB — COMPREHENSIVE METABOLIC PANEL
ALBUMIN: 4.2 g/dL (ref 3.5–5.0)
ALK PHOS: 49 U/L (ref 38–126)
ALT: 13 U/L — AB (ref 14–54)
AST: 14 U/L — ABNORMAL LOW (ref 15–41)
Anion gap: 8 (ref 5–15)
BUN: 10 mg/dL (ref 6–20)
CALCIUM: 8.7 mg/dL — AB (ref 8.9–10.3)
CO2: 23 mmol/L (ref 22–32)
Chloride: 109 mmol/L (ref 101–111)
Creatinine, Ser: 0.64 mg/dL (ref 0.44–1.00)
GFR calc Af Amer: >60 mL/min (ref >60)
GFR calc non Af Amer: >60 mL/min (ref >60)
Glucose, Bld: 94 mg/dL (ref 65–99)
Potassium: 3.5 mmol/L (ref 3.5–5.1)
Sodium: 140 mmol/L (ref 135–145)
Total Bilirubin: 0.6 mg/dL (ref 0.3–1.2)
Total Protein: 7.1 g/dL (ref 6.5–8.1)

## 2014-09-04 LAB — RAPID URINE DRUG SCREEN, HOSP PERFORMED
Amphetamines: NOT DETECTED
Barbiturates: NOT DETECTED
Benzodiazepines: NOT DETECTED
Cocaine: NOT DETECTED
OPIATES: NOT DETECTED
Tetrahydrocannabinol: NOT DETECTED

## 2014-09-04 LAB — SALICYLATE LEVEL: Salicylate Lvl: <4 mg/dL (ref 2.8–30.0)

## 2014-09-04 LAB — ACETAMINOPHEN LEVEL: Acetaminophen (Tylenol), Serum: <10 ug/mL — ABNORMAL LOW (ref 10–30)

## 2014-09-04 LAB — ETHANOL

## 2014-09-04 MED ORDER — LORAZEPAM 1 MG PO TABS
1.0000 mg | ORAL_TABLET | Freq: Three times a day (TID) | ORAL | Status: DC | PRN
  Administered 2014-09-04: 1 mg via ORAL
  Filled 2014-09-04: qty 1

## 2014-09-04 MED ORDER — IBUPROFEN 200 MG PO TABS: 600.0000 mg | ORAL_TABLET | Freq: Three times a day (TID) | ORAL | Status: DC | PRN

## 2014-09-04 MED ORDER — ACETAMINOPHEN 325 MG PO TABS: 650.0000 mg | ORAL_TABLET | ORAL | Status: DC | PRN

## 2014-09-04 MED ORDER — ONDANSETRON HCL 4 MG PO TABS: 4.0000 mg | ORAL_TABLET | Freq: Three times a day (TID) | ORAL | Status: DC | PRN

## 2014-09-04 MED ORDER — ZOLPIDEM TARTRATE 5 MG PO TABS: 5.0000 mg | ORAL_TABLET | Freq: Every evening | ORAL | Status: DC | PRN

## 2014-09-04 NOTE — ED Notes (Signed)
Dominga Ferry called to request that pt be released in her care.  She is requesting to speak with TTS Berna Spare, she can be reached at [phone].  Marcus notified.

## 2014-09-04 NOTE — ED Notes (Signed)
Pt reports SI related to loss of temporary custody of children.

## 2014-09-04 NOTE — ED Notes (Signed)
Pt being eval by TTS Berna Spare at present, prior to RN eval.
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
## 2014-09-04 NOTE — ED Notes (Signed)
PA at bedside.
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
## 2014-09-04 NOTE — ED Notes (Signed)
Bed: [facility] Expected date:  Expected time:  Means of arrival:  Comments: Triage 3 

## 2014-09-04 NOTE — Discharge Instructions (Signed)
Return to the Emergency Department if you develop thoughts of hurting self or others.  Depression Depression refers to feeling sad, low, down in the dumps, blue, gloomy, or empty. In general, there are two kinds of depression: 1. Normal sadness or normal grief. This kind of depression is one that we all feel from time to time after upsetting life experiences, such as the loss of a job or the ending of a relationship. This kind of depression is considered normal, is short lived, and resolves within a few days to 2 weeks. Depression experienced after the loss of a loved one (bereavement) often lasts longer than 2 weeks but normally gets better with time. 2. Clinical depression. This kind of depression lasts longer than normal sadness or normal grief or interferes with your ability to function at home, at work, and in school. It also interferes with your personal relationships. It affects almost every aspect of your life. Clinical depression is an illness. Symptoms of depression can also be caused by conditions other than those mentioned above, such as:  Physical illness. Some physical illnesses, including underactive thyroid gland (hypothyroidism), severe anemia, specific types of cancer, diabetes, uncontrolled seizures, heart and lung problems, strokes, and chronic pain are commonly associated with symptoms of depression.  Side effects of some prescription medicine. In some people, certain types of medicine can cause symptoms of depression.  Substance abuse. Abuse of alcohol and illicit drugs can cause symptoms of depression. SYMPTOMS Symptoms of normal sadness and normal grief include the following:  Feeling sad or crying for short periods of time.  Not caring about anything (apathy).  Difficulty sleeping or sleeping too much.  No longer able to enjoy the things you used to enjoy.  Desire to be by oneself all the time (social isolation).  Lack of energy or motivation.  Difficulty  concentrating or remembering.  Change in appetite or weight.  Restlessness or agitation. Symptoms of clinical depression include the same symptoms of normal sadness or normal grief and also the following symptoms:  Feeling sad or crying all the time.  Feelings of guilt or worthlessness.  Feelings of hopelessness or helplessness.  Thoughts of suicide or the desire to harm yourself (suicidal ideation).  Loss of touch with reality (psychotic symptoms). Seeing or hearing things that are not real (hallucinations) or having false beliefs about your life or the people around you (delusions and paranoia). DIAGNOSIS  The diagnosis of clinical depression is usually based on how bad the symptoms are and how long they have lasted. Your health care provider will also ask you questions about your medical history and substance use to find out if physical illness, use of prescription medicine, or substance abuse is causing your depression. Your health care provider may also order blood tests. TREATMENT  Often, normal sadness and normal grief do not require treatment. However, sometimes antidepressant medicine is given for bereavement to ease the depressive symptoms until they resolve. The treatment for clinical depression depends on how bad the symptoms are but often includes antidepressant medicine, counseling with a mental health professional, or both. Your health care provider will help to determine what treatment is best for you. Depression caused by physical illness usually goes away with appropriate medical treatment of the illness. If prescription medicine is causing depression, talk with your health care provider about stopping the medicine, decreasing the dose, or changing to another medicine. Depression caused by the abuse of alcohol or illicit drugs goes away when you stop using these substances. Some  adults need professional help in order to stop drinking or using drugs. SEEK IMMEDIATE MEDICAL  CARE IF:  You have thoughts about hurting yourself or others.  You lose touch with reality (have psychotic symptoms).  You are taking medicine for depression and have a serious side effect. FOR MORE INFORMATION  National Alliance on Mental Illness: www.nami.[employer] of Mental Health: http://www.maynard.net/ Document Released: 01/18/2000 Document Revised: 06/06/2013 Document Reviewed: 04/21/2011 [facility] Patient Information 2015 [place], [place]. This information is not intended to replace advice given to you by your health care provider. Make sure you discuss any questions you have with your health care provider.

## 2014-10-28 ENCOUNTER — Emergency Department (HOSPITAL_COMMUNITY)
Admission: EM | Admit: 2014-10-28 | Discharge: 2014-10-29 | Disposition: A | Discharge status: Home / Self Care | Payer: No Typology Code available for payment source | Attending: Emergency Medicine | Admitting: Emergency Medicine

## 2014-10-28 ENCOUNTER — Emergency Department (HOSPITAL_COMMUNITY): Payer: No Typology Code available for payment source

## 2014-10-28 ENCOUNTER — Encounter (HOSPITAL_COMMUNITY): Payer: Self-pay | Admitting: Oncology

## 2014-10-28 DIAGNOSIS — Z79899 Other long term (current) drug therapy: Secondary | ICD-10-CM | POA: Diagnosis not present

## 2014-10-28 DIAGNOSIS — T148XXA Other injury of unspecified body region, initial encounter: Secondary | ICD-10-CM

## 2014-10-28 DIAGNOSIS — Z880 Allergy status to penicillin: Secondary | ICD-10-CM | POA: Diagnosis not present

## 2014-10-28 DIAGNOSIS — Y998 Other external cause status: Secondary | ICD-10-CM | POA: Insufficient documentation

## 2014-10-28 DIAGNOSIS — Y9389 Activity, other specified: Secondary | ICD-10-CM | POA: Insufficient documentation

## 2014-10-28 DIAGNOSIS — S40021A Contusion of right upper arm, initial encounter: Secondary | ICD-10-CM | POA: Diagnosis not present

## 2014-10-28 DIAGNOSIS — S0083XA Contusion of other part of head, initial encounter: Secondary | ICD-10-CM | POA: Insufficient documentation

## 2014-10-28 DIAGNOSIS — Z720 Tobacco use: Secondary | ICD-10-CM | POA: Insufficient documentation

## 2014-10-28 DIAGNOSIS — Z8742 Personal history of other diseases of the female genital tract: Secondary | ICD-10-CM | POA: Insufficient documentation

## 2014-10-28 DIAGNOSIS — R780 Finding of alcohol in blood: Secondary | ICD-10-CM

## 2014-10-28 DIAGNOSIS — Y92410 Unspecified street and highway as the place of occurrence of the external cause: Secondary | ICD-10-CM | POA: Insufficient documentation

## 2014-10-28 DIAGNOSIS — S0990XA Unspecified injury of head, initial encounter: Secondary | ICD-10-CM | POA: Diagnosis not present

## 2014-10-28 HISTORY — DX: Fibromyalgia: M79.7

## 2014-10-28 HISTORY — DX: Reserved for concepts with insufficient information to code with codable children: IMO0002

## 2014-10-28 LAB — CBC
HEMATOCRIT: 42.4 % (ref 36.0–46.0)
Hemoglobin: 14.1 g/dL (ref 12.0–15.0)
MCH: 31.1 pg (ref 26.0–34.0)
MCHC: 33.3 g/dL (ref 30.0–36.0)
MCV: 93.6 fL (ref 78.0–100.0)
Platelets: 217 10*3/uL (ref 150–400)
RBC: 4.53 MIL/uL (ref 3.87–5.11)
RDW: 12.6 % (ref 11.5–15.5)
WBC: 5.5 10*3/uL (ref 4.0–10.5)

## 2014-10-28 LAB — BASIC METABOLIC PANEL
Anion gap: 9 (ref 5–15)
BUN: 8 mg/dL (ref 6–20)
CHLORIDE: 109 mmol/L (ref 101–111)
CO2: 23 mmol/L (ref 22–32)
Calcium: 9.2 mg/dL (ref 8.9–10.3)
Creatinine, Ser: 0.61 mg/dL (ref 0.44–1.00)
GFR calc non Af Amer: >60 mL/min (ref >60)
Glucose, Bld: 90 mg/dL (ref 65–99)
Potassium: 3.9 mmol/L (ref 3.5–5.1)
Sodium: 141 mmol/L (ref 135–145)

## 2014-10-28 LAB — ETHANOL: ALCOHOL ETHYL (B): 179 mg/dL — AB (ref <5)

## 2014-10-28 LAB — CBG MONITORING, ED: Glucose-Capillary: 86 mg/dL (ref 65–99)

## 2014-10-28 LAB — I-STAT BETA HCG BLOOD, ED (MC, WL, AP ONLY): I-stat hCG, quantitative: <5 m[IU]/mL (ref <5)

## 2014-10-28 MED ORDER — IBUPROFEN 800 MG PO TABS
800.0000 mg | ORAL_TABLET | Freq: Once | ORAL | Status: AC
  Administered 2014-10-28: 800 mg via ORAL
  Filled 2014-10-28: qty 1

## 2014-10-28 NOTE — Discharge Instructions (Signed)
Take tylenol or motrin for any soreness over the next couple of days Abrasion An abrasion is a cut or scrape of the skin. Abrasions do not extend through all layers of the skin and most heal within 10 days. It is important to care for your abrasion properly to prevent infection. CAUSES  Most abrasions are caused by falling on, or gliding across, the ground or other surface. When your skin rubs on something, the outer and inner layer of skin rubs off, causing an abrasion. DIAGNOSIS  Your caregiver will be able to diagnose an abrasion during a physical exam.  TREATMENT  Your treatment depends on how large and deep the abrasion is. Generally, your abrasion will be cleaned with water and a mild soap to remove any dirt or debris. An antibiotic ointment may be put over the abrasion to prevent an infection. A bandage (dressing) may be wrapped around the abrasion to keep it from getting dirty.  You may need a tetanus shot if:  You cannot remember when you had your last tetanus shot.  You have never had a tetanus shot.  The injury broke your skin. If you get a tetanus shot, your arm may swell, get red, and feel warm to the touch. This is common and not a problem. If you need a tetanus shot and you choose not to have one, there is a rare chance of getting tetanus. Sickness from tetanus can be serious.  HOME CARE INSTRUCTIONS   If a dressing was applied, change it at least once a day or as directed by your caregiver. If the bandage sticks, soak it off with warm water.   Wash the area with water and a mild soap to remove all the ointment 2 times a day. Rinse off the soap and pat the area dry with a clean towel.   Reapply any ointment as directed by your caregiver. This will help prevent infection and keep the bandage from sticking. Use gauze over the wound and under the dressing to help keep the bandage from sticking.   Change your dressing right away if it becomes wet or dirty.   Only take  over-the-counter or prescription medicines for pain, discomfort, or fever as directed by your caregiver.   Follow up with your caregiver within 24-48 hours for a wound check, or as directed. If you were not given a wound-check appointment, look closely at your abrasion for redness, swelling, or pus. These are signs of infection. SEEK IMMEDIATE MEDICAL CARE IF:   You have increasing pain in the wound.   You have redness, swelling, or tenderness around the wound.   You have pus coming from the wound.   You have a fever or persistent symptoms for more than 2-3 days.  You have a fever and your symptoms suddenly get worse.  You have a bad smell coming from the wound or dressing.  MAKE SURE YOU:   Understand these instructions.  Will watch your condition.  Will get help right away if you are not doing well or get worse. Document Released: 10/30/2004 Document Revised: 01/07/2012 Document Reviewed: 12/24/2010 [facility] Patient Information 2015 [place], [place]. This information is not intended to replace advice given to you by your health care provider. Make sure you discuss any questions you have with your health care provider.  Head Injury You have received a head injury. It does not appear serious at this time. Headaches and vomiting are common following head injury. It should be easy to awaken from sleeping.  Sometimes it is necessary for you to stay in the emergency department for a while for observation. Sometimes admission to the hospital may be needed. After injuries such as yours, most problems occur within the first 24 hours, but side effects may occur up to 7-10 days after the injury. It is important for you to carefully monitor your condition and contact your health care provider or seek immediate medical care if there is a change in your condition. WHAT ARE THE TYPES OF HEAD INJURIES? Head injuries can be as minor as a bump. Some head injuries can be more severe. More severe head  injuries include:  A jarring injury to the brain (concussion).  A bruise of the brain (contusion). This mean there is bleeding in the brain that can cause swelling.  A cracked skull (skull fracture).  Bleeding in the brain that collects, clots, and forms a bump (hematoma). WHAT CAUSES A HEAD INJURY? A serious head injury is most likely to happen to someone who is in a car wreck and is not wearing a seat belt. Other causes of major head injuries include bicycle or motorcycle accidents, sports injuries, and falls. HOW ARE HEAD INJURIES DIAGNOSED? A complete history of the event leading to the injury and your current symptoms will be helpful in diagnosing head injuries. Many times, pictures of the brain, such as CT or MRI are needed to see the extent of the injury. Often, an overnight hospital stay is necessary for observation.  WHEN SHOULD I SEEK IMMEDIATE MEDICAL CARE?  You should get help right away if:  You have confusion or drowsiness.  You feel sick to your stomach (nauseous) or have continued, forceful vomiting.  You have dizziness or unsteadiness that is getting worse.  You have severe, continued headaches not relieved by medicine. Only take over-the-counter or prescription medicines for pain, fever, or discomfort as directed by your health care provider.  You do not have normal function of the arms or legs or are unable to walk.  You notice changes in the black spots in the center of the colored part of your eye (pupil).  You have a clear or bloody fluid coming from your nose or ears.  You have a loss of vision. During the next 24 hours after the injury, you must stay with someone who can watch you for the warning signs. This person should contact local emergency services (911 in the [place] if you have seizures, you become unconscious, or you are unable to wake up. HOW CAN I PREVENT A HEAD INJURY IN THE FUTURE? The most important factor for preventing major head injuries is  avoiding motor vehicle accidents. To minimize the potential for damage to your head, it is crucial to wear seat belts while riding in motor vehicles. Wearing helmets while bike riding and playing collision sports (like football) is also helpful. Also, avoiding dangerous activities around the house will further help reduce your risk of head injury.  WHEN CAN I RETURN TO NORMAL ACTIVITIES AND ATHLETICS? You should be reevaluated by your health care provider before returning to these activities. If you have any of the following symptoms, you should not return to activities or contact sports until 1 week after the symptoms have stopped:  Persistent headache.  Dizziness or vertigo.  Poor attention and concentration.  Confusion.  Memory problems.  Nausea or vomiting.  Fatigue or tire easily.  Irritability.  Intolerant of bright lights or loud noises.  Anxiety or depression.  Disturbed sleep. MAKE SURE YOU:  Understand these instructions.  Will watch your condition.  Will get help right away if you are not doing well or get worse. Document Released: 01/20/2005 Document Revised: 01/25/2013 Document Reviewed: 09/27/2012 [facility] Patient Information 2015 [place], [place]. This information is not intended to replace advice given to you by your health care provider. Make sure you discuss any questions you have with your health care provider.

## 2014-10-28 NOTE — ED Notes (Signed)
Bed: WA05 Expected date:  Expected time:  Means of arrival:  Comments: Triage 2 
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
## 2014-12-16 ENCOUNTER — Emergency Department (HOSPITAL_COMMUNITY)
Admission: EM | Admit: 2014-12-16 | Discharge: 2014-12-16 | Disposition: A | Discharge status: Home / Self Care | Payer: Medicaid Other | Attending: Emergency Medicine | Admitting: Emergency Medicine

## 2014-12-16 ENCOUNTER — Encounter (HOSPITAL_COMMUNITY): Payer: Self-pay

## 2014-12-16 ENCOUNTER — Emergency Department (HOSPITAL_COMMUNITY): Payer: Medicaid Other

## 2014-12-16 DIAGNOSIS — F17210 Nicotine dependence, cigarettes, uncomplicated: Secondary | ICD-10-CM | POA: Insufficient documentation

## 2014-12-16 DIAGNOSIS — Z880 Allergy status to penicillin: Secondary | ICD-10-CM | POA: Insufficient documentation

## 2014-12-16 DIAGNOSIS — W540XXA Bitten by dog, initial encounter: Secondary | ICD-10-CM | POA: Insufficient documentation

## 2014-12-16 DIAGNOSIS — Z8742 Personal history of other diseases of the female genital tract: Secondary | ICD-10-CM | POA: Insufficient documentation

## 2014-12-16 DIAGNOSIS — S61552A Open bite of left wrist, initial encounter: Secondary | ICD-10-CM | POA: Diagnosis present

## 2014-12-16 DIAGNOSIS — Y9389 Activity, other specified: Secondary | ICD-10-CM | POA: Diagnosis not present

## 2014-12-16 DIAGNOSIS — Z23 Encounter for immunization: Secondary | ICD-10-CM | POA: Insufficient documentation

## 2014-12-16 DIAGNOSIS — Z79899 Other long term (current) drug therapy: Secondary | ICD-10-CM | POA: Diagnosis not present

## 2014-12-16 DIAGNOSIS — S61532A Puncture wound without foreign body of left wrist, initial encounter: Secondary | ICD-10-CM | POA: Insufficient documentation

## 2014-12-16 DIAGNOSIS — Y92099 Unspecified place in other non-institutional residence as the place of occurrence of the external cause: Secondary | ICD-10-CM | POA: Insufficient documentation

## 2014-12-16 DIAGNOSIS — Y998 Other external cause status: Secondary | ICD-10-CM | POA: Insufficient documentation

## 2014-12-16 DIAGNOSIS — L089 Local infection of the skin and subcutaneous tissue, unspecified: Secondary | ICD-10-CM

## 2014-12-16 DIAGNOSIS — S61452A Open bite of left hand, initial encounter: Secondary | ICD-10-CM

## 2014-12-16 LAB — CBC WITH DIFFERENTIAL/PLATELET
BASOS ABS: 0 10*3/uL (ref 0.0–0.1)
BASOS PCT: 0 %
EOS PCT: 1 %
Eosinophils Absolute: 0.1 10*3/uL (ref 0.0–0.7)
HCT: 42.8 % (ref 36.0–46.0)
Hemoglobin: 14 g/dL (ref 12.0–15.0)
LYMPHS PCT: 27 %
Lymphs Abs: 2.4 10*3/uL (ref 0.7–4.0)
MCH: 31 pg (ref 26.0–34.0)
MCHC: 32.7 g/dL (ref 30.0–36.0)
MCV: 94.7 fL (ref 78.0–100.0)
MONO ABS: 0.5 10*3/uL (ref 0.1–1.0)
Monocytes Relative: 6 %
Neutro Abs: 5.9 10*3/uL (ref 1.7–7.7)
Neutrophils Relative %: 66 %
PLATELETS: 184 10*3/uL (ref 150–400)
RBC: 4.52 MIL/uL (ref 3.87–5.11)
RDW: 13.1 % (ref 11.5–15.5)
WBC: 8.9 10*3/uL (ref 4.0–10.5)

## 2014-12-16 MED ORDER — RABIES IMMUNE GLOBULIN 150 UNIT/ML IM INJ
20.0000 [IU]/kg | INJECTION | Freq: Once | INTRAMUSCULAR | Status: AC
  Administered 2014-12-16: 1425 [IU] via INTRAMUSCULAR
  Filled 2014-12-16: qty 9.5

## 2014-12-16 MED ORDER — HYDROCODONE-ACETAMINOPHEN 5-325 MG PO TABS: 1.0000 | ORAL_TABLET | Freq: Four times a day (QID) | ORAL | Status: DC | PRN

## 2014-12-16 MED ORDER — CLINDAMYCIN HCL 150 MG PO CAPS: 300.0000 mg | ORAL_CAPSULE | Freq: Three times a day (TID) | ORAL | Status: DC

## 2014-12-16 MED ORDER — NAPROXEN 500 MG PO TABS: 500.0000 mg | ORAL_TABLET | Freq: Two times a day (BID) | ORAL | Status: DC

## 2014-12-16 MED ORDER — RABIES VACCINE, PCEC IM SUSR
1.0000 mL | Freq: Once | INTRAMUSCULAR | Status: AC
  Administered 2014-12-16: 1 mL via INTRAMUSCULAR
  Filled 2014-12-16: qty 1

## 2014-12-16 MED ORDER — CLINDAMYCIN PHOSPHATE 600 MG/50ML IV SOLN
600.0000 mg | Freq: Once | INTRAVENOUS | Status: AC
  Administered 2014-12-16: 600 mg via INTRAVENOUS
  Filled 2014-12-16: qty 50

## 2014-12-16 NOTE — Discharge Instructions (Signed)
Take the medication as directed. Return tomorrow for recheck of the wound infection.

## 2014-12-16 NOTE — ED Provider Notes (Signed)
CSN: [id number]     Arrival date & time 12/16/14  1519 History  By signing my name below, I, Monique Lyons, attest that this documentation has been prepared under the direction and in the presence of [facility], [place]. Electronically Signed: Elon Lyons ED Scribe. 12/16/2014. 4:13 PM.    Chief Complaint  Patient presents with  . Animal Bite   Patient is a 29 y.o. female presenting with animal bite. The history is provided by the patient. No language interpreter was used.  Animal Bite Contact animal:  Dog Location:  Hand Hand injury location:  L hand Time since incident:  21 hours Pain details:    Severity:  Moderate   Timing:  Constant Incident location:  Outside Provoked: provoked   Notifications:  None Animal's rabies vaccination status:  Up to date Tetanus status:  Up to date Relieved by:  Nothing Worsened by:  Nothing tried Ineffective treatments: hydrogen peroxide.  HPI Comments: Monique Lyons is a 29 y.o. right-handed female who presents to the Emergency Department complaining of a provoked dog bite on the left wrist and palm sustained by an unknown rottweiler last night at 7:00 pm.  Associated symptoms include redness around the bites, moderate pain that is worse with ROM, and hand swelling (onset today).  The patient reports she was told by the dog owner that the dog was friendly, but, when she went to pet the dog, he bit the patient. Per patient, the owner of the dog reported his vaccinations were UTD.  The patient cleaned the wound with an entire bottle of peroxide.  Last tetanus was two years ago.  She denies fever, chills, red-streaking, other bites/injuries.    Past Medical History  Diagnosis Date  . Ovarian cyst   . No pertinent past medical history   . Bilateral posterior subcapsular polar cataract   . Fibromyalgia    Past Surgical History  Procedure Laterality Date  . Cesarean section      first was c-section; second vbac  . Cesarean section  December 28, 2003  . Tubal ligation Bilateral 10/17/2012    Procedure: POST PARTUM TUBAL LIGATION bilateral;  Surgeon: Lazaro Arms, MD;  Location: [facility];  Service: Gynecology;  Laterality: Bilateral;   Family History  Problem Relation Age of Onset  . Endometriosis Mother   . Heart murmur Mother    Social History  Substance Use Topics  . Smoking status: Current Every Day Smoker -- 0.50 packs/day    Types: Cigarettes  . Smokeless tobacco: Never Used  . Alcohol Use: Yes     Comment: occas   OB History    Gravida Para Term Preterm AB TAB SAB Ectopic Multiple Living   Review of Systems  Musculoskeletal: Positive for joint swelling.       Left hand pain, redness and swelling Puncture wounds from dog bite  Skin: Positive for color change and wound.  All other systems reviewed and are negative.     Allergies  Amoxicillin and Penicillins  Home Medications   Prior to Admission medications   Medication Sig Start Date End Date Taking? Authorizing Provider  acetaminophen (TYLENOL) 500 MG tablet Take 1,000 mg by mouth every 6 (six) hours as needed for headache (pain).    Historical Provider, MD  carbamazepine (TEGRETOL) 200 MG tablet Take 200 mg by mouth at bedtime.     Historical Provider, MD  clindamycin (CLEOCIN)  150 MG capsule Take 2 capsules (300 mg total) by mouth 3 (three) times daily. 12/16/14   Staley Budzinski Orlene OchM Tiny Chaudhary, NP  gabapentin (NEURONTIN) 300 MG capsule Take 300 mg by mouth 3 (three) times daily.    Historical Provider, MD  HYDROcodone-acetaminophen (NORCO) 5-325 MG tablet Take 1 tablet by mouth every 6 (six) hours as needed. 12/16/14   Dudley Cooley Orlene OchM Nakshatra Klose, NP  naproxen (NAPROSYN) 500 MG tablet Take 1 tablet (500 mg total) by mouth 2 (two) times daily. 12/16/14   Breccan Galant Orlene OchM Josephyne Tarter, NP   BP 111/59 mmHg  Pulse 85  Temp(Src) 99.2 F (37.3 C) (Oral)  Resp 16  Ht 5\' 9"  (1.753 m)  Wt 154 lb 6.4 oz (70.035 kg)  BMI 22.79 kg/m2  SpO2 99%  LMP 11/15/2014 Physical Exam   Constitutional: She appears well-developed and well-nourished.  HENT:  Head: Normocephalic and atraumatic.  Eyes: Conjunctivae and EOM are normal.  Neck: Normal range of motion.  Cardiovascular: Normal rate.   Pulmonary/Chest: Effort normal.  Abdominal: Soft. Bowel sounds are normal. There is no tenderness.  Musculoskeletal:       Left hand: She exhibits tenderness and swelling. She exhibits normal capillary refill. Decreased range of motion: due to pain. Normal sensation noted. Normal strength noted.       Hands: Neurological: She is alert.  Skin: Skin is warm and dry.  Adequate circulation.  Good touch sensation.  Radial pulse 2+.  There are puncture sites to the dorsum of the wrist at the radial side.  There are puncture wounds to the thenar area.  There is ecchymosis and erythema noted.  Tenderness with any ROM of the thumb and index finger.    Psychiatric: She has a normal mood and affect. Her behavior is normal.  Nursing note and vitals reviewed.   ED Course  Procedures (including critical care time)  Labs, x-ray  IV Clindamycin since patient allergic to Penicillin  Rabies vaccine  Hand consult  Wound care, soaked, washed, irrigated with NSS Nonadherent Dressing Splint  DIAGNOSTIC STUDIES: Oxygen Saturation is 100% on RA, normal by my interpretation.    COORDINATION OF CARE:  4:08 PM Will consult hand surgeon.  Patient acknowledges and agrees with plan.    5:58 PM Call returned by hand surgeon, Dr. Orlan Leavensrtman.  Will send him pictures of the wounds.   6:10 PM After review of pictures, Dr. Orlan Leavensrtman recommended that for non-adhesive dressing be applied and the patient be sent home on clindamycin.  Ortho tech will apply thumb spika splint . She will return tomorrow for recheck of the wound  7:03 PM Patient reports the dog owner is unknown to her and she has no way of contacting him.  Therefore, there is no way to know if the dog is UTD on its vaccinations.  Will start rabies  vaccine.   Labs Review Results for orders placed or performed during the hospital encounter of 12/16/14 (from the past 24 hour(s))  CBC with Differential/Platelet     Status: None   Collection Time: 12/16/14  4:28 PM  Result Value Ref Range   WBC 8.9 4.0 - 10.5 K/uL   RBC 4.52 3.87 - 5.11 MIL/uL   Hemoglobin 14.0 12.0 - 15.0 g/dL   HCT 96.042.8 45.436.0 - 09.846.0 %   MCV 94.7 78.0 - 100.0 fL   MCH 31.0 26.0 - 34.0 pg   MCHC 32.7 30.0 - 36.0 g/dL   RDW 11.913.1 14.711.5 - 82.915.5 %   Platelets 184 150 - 400  K/uL   Neutrophils Relative % 66 %   Neutro Abs 5.9 1.7 - 7.7 K/uL   Lymphocytes Relative 27 %   Lymphs Abs 2.4 0.7 - 4.0 K/uL   Monocytes Relative 6 %   Monocytes Absolute 0.5 0.1 - 1.0 K/uL   Eosinophils Relative 1 %   Eosinophils Absolute 0.1 0.0 - 0.7 K/uL   Basophils Relative 0 %   Basophils Absolute 0.0 0.0 - 0.1 K/uL    Imaging Review Dg Hand Complete Left  12/16/2014  CLINICAL DATA:  Bit by dog last night, puncture wound on anterior left hand. Redness, unable to move fingers well, edema. EXAM: LEFT HAND - COMPLETE 3+ VIEW COMPARISON:  None. FINDINGS: No osseous fracture or dislocation. No cortical irregularity or osseous lesion. Suspect some soft tissue edema anteriorly. No soft tissue defect appreciated. No abnormal soft tissue gas or foreign body seen. IMPRESSION: Soft tissue swelling/edema. No soft tissue gas or foreign body identified. No osseous abnormality seen. Electronically Signed   By: Bary Richard M.D.   On: 12/16/2014 18:00   I have personally reviewed and evaluated these images and lab results as part of my medical decision-making.   MDM  29 y.o. female with puncture wounds, redness with streaking and swelling of the left hand s/p dog bite yesterday. Stable for d/c without focal neuro deficits. Patient to return tomorrow for recheck and if no improvement Dr. Melvyn Novas will be notified.   Final diagnoses:  Infected dog bite of hand, left, initial encounter   I personally  performed the services described in this documentation, which was scribed in my presence. The recorded information has been reviewed and is accurate.    [address] [place], [place] 12/17/14 1610  Margarita Grizzle, MD 12/17/14 [phone]

## 2014-12-16 NOTE — Progress Notes (Signed)
Orthopedic Tech Progress Note Patient Details:  Monique AtesJanet D Lyons 17-Jun-1985 [id number]  Ortho Devices Type of Ortho Device: Ace wrap, Thumb spica splint Splint Material: Fiberglass Ortho Device/Splint Location: LUE Ortho Device/Splint Interventions: Ordered, Application   Jennye MoccasinHughes, Wilgus Deyton Craig 12/16/2014, 6:44 PM

## 2014-12-16 NOTE — ED Notes (Signed)
Pt reports she was out walking and reached down to pet a rotweiller dog.  Dog bit her on left hand x 2 bite sites.  Pt does not know person who owns dog.  This person reported to pt that dogs vaccines up to date.  Registration will talk with pt to get reported to animal control.

## 2014-12-16 NOTE — ED Notes (Signed)
Lt. Lateral wrist and palm was bitten by a Rotweiller.  Pt. Reports that it happened last night.  She reports that she cleansed it with Peroxide.  The owner of the animal reported to pt. That the dog was updated with Rabies.

## 2014-12-17 ENCOUNTER — Emergency Department (HOSPITAL_COMMUNITY)
Admission: EM | Admit: 2014-12-17 | Discharge: 2014-12-17 | Disposition: A | Discharge status: Home / Self Care | Payer: Medicaid Other | Attending: Physician Assistant | Admitting: Physician Assistant

## 2014-12-17 ENCOUNTER — Encounter (HOSPITAL_COMMUNITY): Payer: Self-pay | Admitting: Emergency Medicine

## 2014-12-17 DIAGNOSIS — W540XXD Bitten by dog, subsequent encounter: Secondary | ICD-10-CM | POA: Insufficient documentation

## 2014-12-17 DIAGNOSIS — Z5189 Encounter for other specified aftercare: Secondary | ICD-10-CM

## 2014-12-17 DIAGNOSIS — F17210 Nicotine dependence, cigarettes, uncomplicated: Secondary | ICD-10-CM | POA: Diagnosis not present

## 2014-12-17 DIAGNOSIS — M79642 Pain in left hand: Secondary | ICD-10-CM

## 2014-12-17 DIAGNOSIS — Z792 Long term (current) use of antibiotics: Secondary | ICD-10-CM | POA: Insufficient documentation

## 2014-12-17 DIAGNOSIS — Z791 Long term (current) use of non-steroidal anti-inflammatories (NSAID): Secondary | ICD-10-CM | POA: Diagnosis not present

## 2014-12-17 DIAGNOSIS — W540XXA Bitten by dog, initial encounter: Secondary | ICD-10-CM

## 2014-12-17 DIAGNOSIS — Z4800 Encounter for change or removal of nonsurgical wound dressing: Secondary | ICD-10-CM | POA: Diagnosis present

## 2014-12-17 DIAGNOSIS — Z79899 Other long term (current) drug therapy: Secondary | ICD-10-CM | POA: Insufficient documentation

## 2014-12-17 DIAGNOSIS — Z8742 Personal history of other diseases of the female genital tract: Secondary | ICD-10-CM | POA: Diagnosis not present

## 2014-12-17 DIAGNOSIS — Z880 Allergy status to penicillin: Secondary | ICD-10-CM | POA: Insufficient documentation

## 2014-12-17 DIAGNOSIS — S61452D Open bite of left hand, subsequent encounter: Secondary | ICD-10-CM | POA: Diagnosis not present

## 2014-12-17 NOTE — ED Provider Notes (Signed)
CSN: [id number]     Arrival date & time 12/17/14  1105 History  By signing my name below, I, Elon SpannerGarrett Cook, attest that this documentation has been prepared under the direction and in the presence of Mandell Pangborn Camprubi-Soms, PA-C. Electronically Signed: Elon SpannerGarrett Cook ED Scribe. 12/17/2014. 11:47 AM.    Chief Complaint  Patient presents with  . Follow-up   Patient is a 29 y.o. female presenting with animal bite. The history is provided by the patient. No language interpreter was used.  Animal Bite Contact animal:  Dog Location:  Hand Hand injury location:  L wrist and L palm Time since incident:  19 hours Pain details:    Quality:  Aching   Severity:  Moderate   Timing:  Constant   Progression:  Improving Incident location:  Outside Provoked: provoked   Notifications:  None Animal's rabies vaccination status:  Unknown Animal in possession: no   Tetanus status:  Up to date Relieved by:  None tried Worsened by:  Activity (ROM) Ineffective treatments:  None tried Associated symptoms: swelling   Associated symptoms: no fever and no numbness     HPI Comments: Monique Lyons is a 29 y.o. female who presents to the Emergency Department for f/u of an animal bite on the left hand that occurred last night.  She was seen in the ED where she had normal imaging, wound was cleaned/dressed (she has not removed it), she was given IV clindamycin, and prescribed clindamycin.  She has not taken her prescribed antibiotic or any other medications because she has not filled the prescription because she was discharged late last night.  She was told to f/u in the ED today to have her wound redressed.  She complains currently of constant, improving, 5/10, sore/aching, non-radiating left hand pain that is worse with moving, with no tx tried PTA, as well as improving swelling. She denies drainage, bleeding through dressing, fever, chills, numbness, tingling, weakness, CP, SOB, abdominal pain, n/v/d/c , dysuria,  hematuria.     Past Medical History  Diagnosis Date  . Ovarian cyst   . No pertinent past medical history   . Bilateral posterior subcapsular polar cataract   . Fibromyalgia    Past Surgical History  Procedure Laterality Date  . Cesarean section      first was c-section; second vbac  . Cesarean section  December 28, 2003  . Tubal ligation Bilateral 10/17/2012    Procedure: POST PARTUM TUBAL LIGATION bilateral;  Surgeon: Lazaro ArmsLuther H Eure, MD;  Location: [facility];  Service: Gynecology;  Laterality: Bilateral;   Family History  Problem Relation Age of Onset  . Endometriosis Mother   . Heart murmur Mother    Social History  Substance Use Topics  . Smoking status: Current Every Day Smoker -- 0.50 packs/day    Types: Cigarettes  . Smokeless tobacco: Never Used  . Alcohol Use: Yes     Comment: occas   OB History    Gravida Para Term Preterm AB TAB SAB Ectopic Multiple Living   5 4 4  1     4      Review of Systems  Constitutional: Negative for fever and chills.  Respiratory: Negative for shortness of breath.   Cardiovascular: Negative for chest pain.  Gastrointestinal: Negative for nausea, vomiting, abdominal pain, diarrhea and constipation.  Genitourinary: Negative for dysuria and hematuria.  Musculoskeletal: Positive for joint swelling (improved) and arthralgias (improving).  Skin: Positive for wound.  Allergic/Immunologic: Negative for immunocompromised state.  Neurological: Negative for  weakness and numbness.  All other systems reviewed and are negative. 10 Systems reviewed and all are negative for acute change except as noted in the HPI.  Allergies  Amoxicillin and Penicillins  Home Medications   Prior to Admission medications   Medication Sig Start Date End Date Taking? Authorizing Provider  acetaminophen (TYLENOL) 500 MG tablet Take 1,000 mg by mouth every 6 (six) hours as needed for headache (pain).    Historical Provider, MD  carbamazepine (TEGRETOL) 200 MG tablet  Take 200 mg by mouth at bedtime.     Historical Provider, MD  clindamycin (CLEOCIN) 150 MG capsule Take 2 capsules (300 mg total) by mouth 3 (three) times daily. 12/16/14   Hope Orlene Och, NP  gabapentin (NEURONTIN) 300 MG capsule Take 300 mg by mouth 3 (three) times daily.    Historical Provider, MD  HYDROcodone-acetaminophen (NORCO) 5-325 MG tablet Take 1 tablet by mouth every 6 (six) hours as needed. 12/16/14   Hope Orlene Och, NP  naproxen (NAPROSYN) 500 MG tablet Take 1 tablet (500 mg total) by mouth 2 (two) times daily. 12/16/14   Hope Orlene Och, NP   BP 127/83 mmHg  Pulse 100  Temp(Src) 98.4 F (36.9 C) (Oral)  Resp 16  SpO2 100%  LMP 11/15/2014 Physical Exam  Constitutional: She is oriented to person, place, and time. Vital signs are normal. She appears well-developed and well-nourished.  Non-toxic appearance. No distress.  Afebrile, nontoxic, NAD  HENT:  Head: Normocephalic and atraumatic.  Mouth/Throat: Mucous membranes are normal.  Eyes: Conjunctivae and EOM are normal. Right eye exhibits no discharge. Left eye exhibits no discharge.  Neck: Normal range of motion. Neck supple.  Cardiovascular: Normal rate and intact distal pulses.   Pulmonary/Chest: Effort normal. No respiratory distress.  Abdominal: Normal appearance. She exhibits no distension.  Musculoskeletal:       Left hand: She exhibits decreased range of motion (due to pain), tenderness, laceration and swelling. She exhibits normal capillary refill and no deformity. Normal sensation noted. Decreased strength (due to pain) noted.       Hands: Left hand with limited ROM due to pain although FROM intact at MCP, DIP, and PIP in all fingers. +tenderness of the thenar eminence over where there is a puncture wound with no surrounding erythema, no drainage, no warmth, but with slight swelling in the surrounding area. Strength somewhat limited due to pain, but sensation grossly intact. Distal pulses intact. Cap refill brisk and present.   Left wrist with small puncture wound, also with no drainage, erythema, or warmth.   Neurological: She is alert and oriented to person, place, and time. She has normal strength. No sensory deficit.  Skin: Skin is warm and dry. No rash noted.  Left hand puncture wounds as noted above.   Psychiatric: She has a normal mood and affect. Her behavior is normal.  Nursing note and vitals reviewed.   ED Course  Procedures (including critical care time)  DIAGNOSTIC STUDIES: Oxygen Saturation is 99% on RA, normal by my interpretation.    COORDINATION OF CARE:  11:42 AM Patient advised to start antibiotic prescribed yesterday in ED.  Will redress complaint.  Patient should f/u with Dr. Orlan Leavens. Patient acknowledges and agrees with plan.     Labs Review Labs Reviewed - No data to display  Imaging Review Dg Hand Complete Left  12/16/2014  CLINICAL DATA:  Bit by dog last night, puncture wound on anterior left hand. Redness, unable to move fingers well, edema. EXAM: LEFT  HAND - COMPLETE 3+ VIEW COMPARISON:  None. FINDINGS: No osseous fracture or dislocation. No cortical irregularity or osseous lesion. Suspect some soft tissue edema anteriorly. No soft tissue defect appreciated. No abnormal soft tissue gas or foreign body seen. IMPRESSION: Soft tissue swelling/edema. No soft tissue gas or foreign body identified. No osseous abnormality seen. Electronically Signed   By: Bary Richard M.D.   On: 12/16/2014 18:00   I have personally reviewed and evaluated these images and lab results as part of my medical decision-making.   EKG Interpretation None      MDM   Final diagnoses:  Visit for wound check  Dog bite  Left hand pain    29 y.o. female here for wound check. Dog bite yesterday. xrays neg yesterday. Tetanus UTD. Given IV clinda yesterday, told to start PO clinda. Hasn't picked it up yet, but will when she leaves today. Pain and swelling improving, no ongoing erythema. No drainage. Dr Melvyn Novas  contacted yesterday, instructed to splint and start on abx, then f/up today for recheck. Doubt need for reconsultation today. Will have her f/up with Dr. Melvyn Novas this week in clinic. Re-wrapped splinting. Ice, elevation, and pain meds from yesterday for pain. I explained the diagnosis and have given explicit precautions to return to the ER including for any other new or worsening symptoms. The patient understands and accepts the medical plan as it's been dictated and I have answered their questions. Discharge instructions concerning home care and prescriptions have been given. The patient is STABLE and is discharged to home in good condition.   I personally performed the services described in this documentation, which was scribed in my presence. The recorded information has been reviewed and is accurate.  BP 127/83 mmHg  Pulse 100  Temp(Src) 98.4 F (36.9 C) (Oral)  Resp 16  SpO2 100%  LMP 11/15/2014  No orders of the defined types were placed in this encounter.      Avrum Kimball Camprubi-Soms, PA-C 12/17/14 1154  Courteney Randall An, MD 12/17/14 1637

## 2014-12-17 NOTE — Discharge Instructions (Signed)
Keep wound clean with mild soap and water. Keep area covered with a topical antibiotic ointment and bandage, keep bandage dry, and wear splint until you see the orthopedist. Ice and elevate for additional pain relief and swelling. Alternate between Naprosyn and norco prescribed for you yesterday for additional pain relief but don't drive while taking norco. Follow up with the hand specialist this week, call on Monday to schedule an appointment for wound recheck and ongoing management. Make sure you start taking the antibiotic you were prescribed. Follow up as instructed yesterday for your rabies shots. Monitor area for signs of infection to include, but not limited to: increasing pain, spreading redness, drainage/pus, worsening swelling, or fevers. Return to emergency department for emergent changing or worsening symptoms.

## 2014-12-17 NOTE — ED Notes (Signed)
Patient states was here yesterday after a dog bite.  Patient states she is here to see hand surgeon and get resplinted.   Patient states no problems.

## 2015-08-27 ENCOUNTER — Encounter (HOSPITAL_COMMUNITY): Payer: Self-pay | Admitting: Emergency Medicine

## 2015-08-27 ENCOUNTER — Ambulatory Visit (HOSPITAL_COMMUNITY)
Admission: EM | Admit: 2015-08-27 | Discharge: 2015-08-27 | Disposition: A | Discharge status: Home / Self Care | Payer: No Typology Code available for payment source | Source: Ambulatory Visit | Attending: Emergency Medicine | Admitting: Emergency Medicine

## 2015-08-27 ENCOUNTER — Emergency Department (HOSPITAL_COMMUNITY)
Admission: EM | Admit: 2015-08-27 | Discharge: 2015-08-28 | Disposition: A | Discharge status: Home / Self Care | Payer: Medicaid Other | Attending: Emergency Medicine | Admitting: Emergency Medicine

## 2015-08-27 ENCOUNTER — Emergency Department (HOSPITAL_COMMUNITY): Payer: Medicaid Other

## 2015-08-27 DIAGNOSIS — R1012 Left upper quadrant pain: Secondary | ICD-10-CM | POA: Diagnosis not present

## 2015-08-27 DIAGNOSIS — M797 Fibromyalgia: Secondary | ICD-10-CM | POA: Insufficient documentation

## 2015-08-27 DIAGNOSIS — R1032 Left lower quadrant pain: Secondary | ICD-10-CM | POA: Diagnosis not present

## 2015-08-27 DIAGNOSIS — S30811A Abrasion of abdominal wall, initial encounter: Secondary | ICD-10-CM | POA: Insufficient documentation

## 2015-08-27 DIAGNOSIS — Z880 Allergy status to penicillin: Secondary | ICD-10-CM | POA: Insufficient documentation

## 2015-08-27 DIAGNOSIS — Y999 Unspecified external cause status: Secondary | ICD-10-CM | POA: Diagnosis not present

## 2015-08-27 DIAGNOSIS — F17210 Nicotine dependence, cigarettes, uncomplicated: Secondary | ICD-10-CM | POA: Insufficient documentation

## 2015-08-27 DIAGNOSIS — Z0441 Encounter for examination and observation following alleged adult rape: Secondary | ICD-10-CM | POA: Diagnosis not present

## 2015-08-27 DIAGNOSIS — Y9339 Activity, other involving climbing, rappelling and jumping off: Secondary | ICD-10-CM | POA: Diagnosis not present

## 2015-08-27 DIAGNOSIS — IMO0002 Reserved for concepts with insufficient information to code with codable children: Secondary | ICD-10-CM

## 2015-08-27 DIAGNOSIS — Y9289 Other specified places as the place of occurrence of the external cause: Secondary | ICD-10-CM | POA: Diagnosis not present

## 2015-08-27 LAB — URINALYSIS, ROUTINE W REFLEX MICROSCOPIC
GLUCOSE, UA: NEGATIVE mg/dL
Ketones, ur: 15 mg/dL — AB
NITRITE: NEGATIVE
PH: 5.5 (ref 5.0–8.0)
Protein, ur: 30 mg/dL — AB
SPECIFIC GRAVITY, URINE: 1.032 — AB (ref 1.005–1.030)

## 2015-08-27 LAB — COMPREHENSIVE METABOLIC PANEL
ALBUMIN: 3.5 g/dL (ref 3.5–5.0)
ALK PHOS: 51 U/L (ref 38–126)
ALT: 22 U/L (ref 14–54)
ANION GAP: 6 (ref 5–15)
AST: 24 U/L (ref 15–41)
BUN: 7 mg/dL (ref 6–20)
CALCIUM: 8.8 mg/dL — AB (ref 8.9–10.3)
CO2: 27 mmol/L (ref 22–32)
Chloride: 107 mmol/L (ref 101–111)
Creatinine, Ser: 0.75 mg/dL (ref 0.44–1.00)
GFR calc Af Amer: >60 mL/min (ref >60)
GFR calc non Af Amer: >60 mL/min (ref >60)
GLUCOSE: 103 mg/dL — AB (ref 65–99)
POTASSIUM: 3.3 mmol/L — AB (ref 3.5–5.1)
SODIUM: 140 mmol/L (ref 135–145)
TOTAL PROTEIN: 5.8 g/dL — AB (ref 6.5–8.1)
Total Bilirubin: 0.6 mg/dL (ref 0.3–1.2)

## 2015-08-27 LAB — URINE MICROSCOPIC-ADD ON

## 2015-08-27 LAB — CBC WITH DIFFERENTIAL/PLATELET
Basophils Absolute: 0 10*3/uL (ref 0.0–0.1)
Basophils Relative: 0 %
Eosinophils Absolute: 0 10*3/uL (ref 0.0–0.7)
Eosinophils Relative: 0 %
HEMATOCRIT: 44.7 % (ref 36.0–46.0)
HEMOGLOBIN: 14.5 g/dL (ref 12.0–15.0)
LYMPHS ABS: 1.6 10*3/uL (ref 0.7–4.0)
LYMPHS PCT: 25 %
MCH: 32.4 pg (ref 26.0–34.0)
MCHC: 32.4 g/dL (ref 30.0–36.0)
MCV: 99.8 fL (ref 78.0–100.0)
MONOS PCT: 6 %
Monocytes Absolute: 0.4 10*3/uL (ref 0.1–1.0)
NEUTROS PCT: 69 %
Neutro Abs: 4.3 10*3/uL (ref 1.7–7.7)
Platelets: 197 10*3/uL (ref 150–400)
RBC: 4.48 MIL/uL (ref 3.87–5.11)
RDW: 13.1 % (ref 11.5–15.5)
WBC: 6.3 10*3/uL (ref 4.0–10.5)

## 2015-08-27 LAB — PREGNANCY, URINE: Preg Test, Ur: NEGATIVE

## 2015-08-27 LAB — LIPASE, BLOOD: Lipase: 18 U/L (ref 11–51)

## 2015-08-27 MED ORDER — ACETAMINOPHEN 325 MG PO TABS
650.0000 mg | ORAL_TABLET | Freq: Once | ORAL | Status: AC
  Administered 2015-08-27: 650 mg via ORAL
  Filled 2015-08-27: qty 2

## 2015-08-27 MED ORDER — METRONIDAZOLE 500 MG PO TABS
2000.0000 mg | ORAL_TABLET | Freq: Once | ORAL | Status: AC
Start: 2015-08-27 — End: 2015-08-27
  Administered 2015-08-27: 2000 mg via ORAL
  Filled 2015-08-27: qty 4

## 2015-08-27 MED ORDER — LIDOCAINE HCL (PF) 1 % IJ SOLN: 0.9000 mL | Freq: Once | INTRAMUSCULAR | Status: DC

## 2015-08-27 MED ORDER — ULIPRISTAL ACETATE 30 MG PO TABS
30.0000 mg | ORAL_TABLET | Freq: Once | ORAL | Status: AC
  Administered 2015-08-27: 30 mg via ORAL
  Filled 2015-08-27: qty 1

## 2015-08-27 MED ORDER — CEFTRIAXONE SODIUM 250 MG IJ SOLR: 250.0000 mg | Freq: Once | INTRAMUSCULAR | Status: DC

## 2015-08-27 MED ORDER — PROMETHAZINE HCL 25 MG PO TABS
25.0000 mg | ORAL_TABLET | Freq: Four times a day (QID) | ORAL | Status: DC | PRN
  Administered 2015-08-27: 25 mg via ORAL
  Filled 2015-08-27: qty 1

## 2015-08-27 MED ORDER — AZITHROMYCIN 250 MG PO TABS
1000.0000 mg | ORAL_TABLET | Freq: Once | ORAL | Status: AC
  Administered 2015-08-27: 1000 mg via ORAL

## 2015-08-27 MED ORDER — POTASSIUM CHLORIDE CRYS ER 20 MEQ PO TBCR
20.0000 meq | EXTENDED_RELEASE_TABLET | Freq: Once | ORAL | Status: AC
  Administered 2015-08-27: 20 meq via ORAL
  Filled 2015-08-27 (×2): qty 1

## 2015-08-27 MED ORDER — IOPAMIDOL (ISOVUE-300) INJECTION 61%
INTRAVENOUS | Status: AC
  Administered 2015-08-27: 100 mL
  Filled 2015-08-27: qty 100
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
## 2015-08-27 NOTE — Discharge Instructions (Signed)
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 agitated; fever, chills, cough, sore throat or body aches; vomiting within one hour of taking the medicine.  Report only if these become bothersome:  Diarrhea, dizziness, headache, stomach upset or vomiting, tooth discoloration, vaginal irritation, or numbness in part of your body.  Precautions:  Your healthcare provider (HCP) needs to know if you have any  of the following conditions:  Kidney disease, liver disease, irregular heartbeat or heart disease, an unusual or allergic reaction to any medications, foods, dyes, preservatives, or if you are pregnant or trying to get pregnant, or are breastfeeding.  Tell your HCP if your symptoms do not improve.  Do not treat diarrhea with over-the-counter products.  Contact your HCP if you have diarrhea that lasts more than 2 days or if it is severe and watery.

## 2015-08-27 NOTE — ED Notes (Signed)
Sane nurse at bedside 
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
## 2015-08-27 NOTE — ED Notes (Signed)
SANE Nurse paged via Diplomatic Services operational officer.

## 2015-08-28 MED ORDER — CEFIXIME 400 MG PO CAPS
400.0000 mg | ORAL_CAPSULE | Freq: Once | ORAL | Status: AC
  Administered 2015-08-28: 400 mg via ORAL
  Filled 2015-08-28: qty 1
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
## 2015-08-28 NOTE — SANE Note (Signed)
Addendum: Patient declined Rocephin. SANE spoke with Monique Lyons, [place] and pharmacy. Cefixime 400mg  po ordered. Patient did receive. Reviewed discharge orders with patient. She verbalized understanding. Reviewed exam with Monique Lyons, [place]. Patient discharged to home--stated she felt much better.

## 2015-08-29 ENCOUNTER — Encounter: Payer: Self-pay | Admitting: Obstetrics and Gynecology

## 2015-09-12 ENCOUNTER — Ambulatory Visit: Payer: Self-pay

## 2016-03-11 ENCOUNTER — Encounter (HOSPITAL_COMMUNITY): Payer: Self-pay | Admitting: Emergency Medicine

## 2016-03-11 DIAGNOSIS — Z5321 Procedure and treatment not carried out due to patient leaving prior to being seen by health care provider: Secondary | ICD-10-CM | POA: Diagnosis not present

## 2016-03-11 DIAGNOSIS — R42 Dizziness and giddiness: Secondary | ICD-10-CM | POA: Diagnosis present

## 2016-03-11 NOTE — ED Triage Notes (Signed)
Per pt, she has felt dizzy "for months"  States she came in tonight because she felt "more dizzy" and "I don't think it is b/c we drank a little tonight.  Pt reports 2 beers and 1 mixed drink.  Pt is concerned that she may have diabetes.

## 2016-03-12 ENCOUNTER — Emergency Department (HOSPITAL_COMMUNITY)
Admission: EM | Admit: 2016-03-12 | Discharge: 2016-03-12 | Disposition: A | Discharge status: Home / Self Care | Payer: Medicaid Other | Attending: Emergency Medicine | Admitting: Emergency Medicine

## 2016-03-12 NOTE — ED Notes (Signed)
Pt called for in lobby for vital sign reassessment x3, No answer.

## 2016-03-12 NOTE — ED Notes (Signed)
Called for patient in lobby x 3 to collect urine sample.  No response

## 2016-03-12 NOTE — ED Notes (Signed)
Pt called for in waiting room for vital sign reassessment. No answer. 

## 2016-08-26 ENCOUNTER — Emergency Department (HOSPITAL_COMMUNITY): Payer: Medicaid Other

## 2016-08-26 ENCOUNTER — Emergency Department (HOSPITAL_COMMUNITY)
Admission: EM | Admit: 2016-08-26 | Discharge: 2016-08-26 | Disposition: A | Discharge status: Home / Self Care | Payer: Medicaid Other | Attending: Emergency Medicine | Admitting: Emergency Medicine

## 2016-08-26 ENCOUNTER — Encounter (HOSPITAL_COMMUNITY): Payer: Self-pay | Admitting: *Deleted

## 2016-08-26 DIAGNOSIS — N9489 Other specified conditions associated with female genital organs and menstrual cycle: Secondary | ICD-10-CM | POA: Diagnosis not present

## 2016-08-26 DIAGNOSIS — F17210 Nicotine dependence, cigarettes, uncomplicated: Secondary | ICD-10-CM | POA: Insufficient documentation

## 2016-08-26 DIAGNOSIS — F329 Major depressive disorder, single episode, unspecified: Secondary | ICD-10-CM | POA: Diagnosis not present

## 2016-08-26 DIAGNOSIS — N938 Other specified abnormal uterine and vaginal bleeding: Secondary | ICD-10-CM | POA: Diagnosis not present

## 2016-08-26 DIAGNOSIS — N939 Abnormal uterine and vaginal bleeding, unspecified: Secondary | ICD-10-CM | POA: Diagnosis present

## 2016-08-26 DIAGNOSIS — R1031 Right lower quadrant pain: Secondary | ICD-10-CM | POA: Insufficient documentation

## 2016-08-26 DIAGNOSIS — F32A Depression, unspecified: Secondary | ICD-10-CM

## 2016-08-26 LAB — URINALYSIS, ROUTINE W REFLEX MICROSCOPIC
Bilirubin Urine: NEGATIVE
GLUCOSE, UA: NEGATIVE mg/dL
Ketones, ur: NEGATIVE mg/dL
Nitrite: NEGATIVE
PH: 6 (ref 5.0–8.0)
Protein, ur: 100 mg/dL — AB
SPECIFIC GRAVITY, URINE: 1.026 (ref 1.005–1.030)

## 2016-08-26 LAB — WET PREP, GENITAL
SPERM: NONE SEEN
Trich, Wet Prep: NONE SEEN
WBC, Wet Prep HPF POC: NONE SEEN
YEAST WET PREP: NONE SEEN

## 2016-08-26 LAB — COMPREHENSIVE METABOLIC PANEL
ALBUMIN: 3.7 g/dL (ref 3.5–5.0)
ALK PHOS: 62 U/L (ref 38–126)
ALT: 18 U/L (ref 14–54)
ANION GAP: 6 (ref 5–15)
AST: 21 U/L (ref 15–41)
BUN: 10 mg/dL (ref 6–20)
CALCIUM: 8.8 mg/dL — AB (ref 8.9–10.3)
CHLORIDE: 107 mmol/L (ref 101–111)
CO2: 28 mmol/L (ref 22–32)
Creatinine, Ser: 0.96 mg/dL (ref 0.44–1.00)
GFR calc Af Amer: >60 mL/min (ref >60)
GFR calc non Af Amer: >60 mL/min (ref >60)
GLUCOSE: 114 mg/dL — AB (ref 65–99)
Potassium: 3.4 mmol/L — ABNORMAL LOW (ref 3.5–5.1)
SODIUM: 141 mmol/L (ref 135–145)
Total Bilirubin: 0.7 mg/dL (ref 0.3–1.2)
Total Protein: 6.2 g/dL — ABNORMAL LOW (ref 6.5–8.1)

## 2016-08-26 LAB — CBC
HEMATOCRIT: 40.8 % (ref 36.0–46.0)
HEMOGLOBIN: 13.3 g/dL (ref 12.0–15.0)
MCH: 31.3 pg (ref 26.0–34.0)
MCHC: 32.6 g/dL (ref 30.0–36.0)
MCV: 96 fL (ref 78.0–100.0)
Platelets: 211 10*3/uL (ref 150–400)
RBC: 4.25 MIL/uL (ref 3.87–5.11)
RDW: 12.4 % (ref 11.5–15.5)
WBC: 6.9 10*3/uL (ref 4.0–10.5)

## 2016-08-26 LAB — LIPASE, BLOOD: LIPASE: 26 U/L (ref 11–51)

## 2016-08-26 LAB — RAPID URINE DRUG SCREEN, HOSP PERFORMED
AMPHETAMINES: NOT DETECTED
BARBITURATES: NOT DETECTED
Benzodiazepines: NOT DETECTED
Cocaine: POSITIVE — AB
Opiates: NOT DETECTED
TETRAHYDROCANNABINOL: POSITIVE — AB

## 2016-08-26 LAB — PREGNANCY, URINE: PREG TEST UR: NEGATIVE

## 2016-08-26 MED ORDER — KETOROLAC TROMETHAMINE 30 MG/ML IJ SOLN
30.0000 mg | Freq: Once | INTRAMUSCULAR | Status: AC
  Administered 2016-08-26: 30 mg via INTRAVENOUS
  Filled 2016-08-26: qty 1

## 2016-08-26 MED ORDER — IOPAMIDOL (ISOVUE-300) INJECTION 61%
100.0000 mL | Freq: Once | INTRAVENOUS | Status: AC | PRN
  Administered 2016-08-26: 100 mL via INTRAVENOUS

## 2016-08-26 NOTE — ED Provider Notes (Signed)
[facility]-EMERGENCY DEPT Provider Note   CSN: [id number] Arrival date & time: 08/26/16  1404     History   Chief Complaint Chief Complaint  Patient presents with  . Vaginal Bleeding     HPI  Blood pressure (!) 140/93, pulse 74, temperature 98.9 F (37.2 C), temperature source Oral, resp. rate 18, last menstrual period 08/07/2016, SpO2 100 %.  Monique Lyons is a 31 y.o. female with multiple complaints including vaginal bleeding. Patient states that she started menstruating 2 days ago, 3 pads per day, states that she had her regular 4 day. On July 5 and her menses are normally regular. It's associated with the right lower quadrant abdominal pain which she describes as severe. She states that she's had this pain for 6 months but it's increased in severity. She states that she had a temperature of 101 x2 days ago. She also notes that she feels that she passed a mucous plug when she urinated. She states that she is not sure if she has any abnormal vaginal discharge. This patient also is quite tearful and states that she is drinking heavily, drinking daily and she feels that this is in compensation for self to seeing her Latuda and Prozac one year ago. She was following at [facility] but she hasn't checked in with them recently. She states that she is not sleeping well and she feels that she is very depressed because her children were taken out of her custody 2 years ago. She denies any suicidal ideation, homicidal ideation, auditory or visual hallucinations. She states that she doesn't feel that she is addicted to alcohol but she feels that it is a problem and that she is self-medicating because of the lack of psychiatric medication.    Past Medical History:  Diagnosis Date  . Bilateral posterior subcapsular polar cataract   . Fibromyalgia   . No pertinent past medical history   . Ovarian cyst     Patient Active Problem List   Diagnosis Date Noted  . Dysuria 11/15/2012  . Smoker 09/07/2012    . History of C-section 01/04/2011    Past Surgical History:  Procedure Laterality Date  . CESAREAN SECTION     first was c-section; second vbac  . CESAREAN SECTION  December 28, 2003  . TUBAL LIGATION Bilateral 10/17/2012   Procedure: POST PARTUM TUBAL LIGATION bilateral;  Surgeon: Lazaro Arms, MD;  Location: [facility];  Service: Gynecology;  Laterality: Bilateral;    OB History    Gravida Para Term Preterm AB Living   5 4 4   1 4    SAB TAB Ectopic Multiple Live Births           4       Home Medications    Prior to Admission medications   Medication Sig Start Date End Date Taking? Authorizing Provider  acetaminophen (TYLENOL) 500 MG tablet Take 1,000 mg by mouth every 6 (six) hours as needed for headache (pain).   Yes [provider]  Multiple Vitamin (MULTIVITAMIN WITH MINERALS) TABS tablet Take 1 tablet by mouth daily.   Yes [provider]  omega-3 acid ethyl esters (LOVAZA) 1 g capsule Take 1 g by mouth daily.   Yes [provider]    Family History Family History  Problem Relation Age of Onset  . Endometriosis Mother   . Heart murmur Mother     Social History Social History  Substance Use Topics  . Smoking status: Current Every Day Smoker  Packs/day: 0.50    Types: Cigarettes  . Smokeless tobacco: Never Used  . Alcohol use 1.8 oz/week    2 Cans of beer, 1 Standard drinks or equivalent per week     Comment: occas     Allergies   Amoxicillin and Penicillins   Review of Systems Review of Systems  A complete review of systems was obtained and all systems are negative except as noted in the HPI and PMH.    Physical Exam Updated Vital Signs BP (!) 134/91   Pulse 66   Temp 98.9 F (37.2 C) (Oral)   Resp 18   LMP 08/07/2016 (Approximate)   SpO2 100%   Physical Exam  Constitutional: She is oriented to person, place, and time. She appears well-developed and well-nourished. No distress.  HENT:  Head: Normocephalic and  atraumatic.  Mouth/Throat: Oropharynx is clear and moist.  Eyes: Pupils are equal, round, and reactive to light. Conjunctivae and EOM are normal.  Neck: Normal range of motion.  Cardiovascular: Normal rate, regular rhythm and intact distal pulses.   Pulmonary/Chest: Effort normal and breath sounds normal.  Abdominal: Soft. She exhibits no distension and no mass. There is no tenderness. There is no rebound and no guarding. No hernia.  Normal active bowel sounds, no significant tenderness palpation of any quadrant.  Pelvic exam a chaperoned by RN: No rashes or lesions, no cervical motion or adnexal tenderness, there is a scant amount of dark blood pooled in the posterior fornix.   Musculoskeletal: Normal range of motion.  Neurological: She is alert and oriented to person, place, and time.  Skin: She is not diaphoretic.  Psychiatric: She has a normal mood and affect.  Nursing note and vitals reviewed.    ED Treatments / Results  Labs (all labs ordered are listed, but only abnormal results are displayed) Labs Reviewed  WET PREP, GENITAL - Abnormal; Notable for the following:       Result Value   Clue Cells Wet Prep HPF POC PRESENT (*)    All other components within normal limits  COMPREHENSIVE METABOLIC PANEL - Abnormal; Notable for the following:    Potassium 3.4 (*)    Glucose, Bld 114 (*)    Calcium 8.8 (*)    Total Protein 6.2 (*)    All other components within normal limits  URINALYSIS, ROUTINE W REFLEX MICROSCOPIC - Abnormal; Notable for the following:    APPearance CLOUDY (*)    Hgb urine dipstick LARGE (*)    Protein, ur 100 (*)    Leukocytes, UA TRACE (*)    Bacteria, UA RARE (*)    Squamous Epithelial / LPF 6-30 (*)    All other components within normal limits  RAPID URINE DRUG SCREEN, HOSP PERFORMED - Abnormal; Notable for the following:    Cocaine POSITIVE (*)    Tetrahydrocannabinol POSITIVE (*)    All other components within normal limits  LIPASE, BLOOD  CBC    PREGNANCY, URINE  RPR  HIV ANTIBODY (ROUTINE TESTING)  GC/CHLAMYDIA PROBE AMP ([place]) NOT AT [facility]    EKG  EKG Interpretation None       Radiology [place] Ob Transvaginal  Result Date: 08/26/2016 CLINICAL DATA:  Initial evaluation for acute right lower quadrant pain. EXAM: TRANSABDOMINAL AND TRANSVAGINAL ULTRASOUND OF PELVIS DOPPLER ULTRASOUND OF OVARIES TECHNIQUE: Both transabdominal and transvaginal ultrasound examinations of the pelvis were performed. Transabdominal technique was performed for global imaging of the pelvis including uterus, ovaries, adnexal regions, and pelvic cul-de-sac. It was  necessary to proceed with endovaginal exam following the transabdominal exam to visualize the uterus and ovaries. Color and duplex Doppler ultrasound was utilized to evaluate blood flow to the ovaries. COMPARISON:  None. FINDINGS: Uterus Measurements: 6.8 x 3.1 x 4.9 cm. No fibroids or other mass visualized. Endometrium Thickness: 1.8 mm.  No focal abnormality visualized. Right ovary Measurements: 1.5 x 2.7 x 1.5 cm. Normal appearance/no adnexal mass. Left ovary Measurements: 2.6 x 1.4 x 1.6 cm. Normal appearance/no adnexal mass. Pulsed Doppler evaluation of both ovaries demonstrates normal low-resistance arterial and venous waveforms. Other findings No abnormal free fluid. IMPRESSION: Normal pelvic ultrasound.  No acute abnormality identified. Electronically Signed   By: Rise Mu M.D.   On: 08/26/2016 20:54   US Pelvis Complete  Result Date: 08/26/2016 CLINICAL DATA:  Initial evaluation for acute right lower quadrant pain. EXAM: TRANSABDOMINAL AND TRANSVAGINAL ULTRASOUND OF PELVIS DOPPLER ULTRASOUND OF OVARIES TECHNIQUE: Both transabdominal and transvaginal ultrasound examinations of the pelvis were performed. Transabdominal technique was performed for global imaging of the pelvis including uterus, ovaries, adnexal regions, and pelvic cul-de-sac. It was necessary to proceed with  endovaginal exam following the transabdominal exam to visualize the uterus and ovaries. Color and duplex Doppler ultrasound was utilized to evaluate blood flow to the ovaries. COMPARISON:  None. FINDINGS: Uterus Measurements: 6.8 x 3.1 x 4.9 cm. No fibroids or other mass visualized. Endometrium Thickness: 1.8 mm.  No focal abnormality visualized. Right ovary Measurements: 1.5 x 2.7 x 1.5 cm. Normal appearance/no adnexal mass. Left ovary Measurements: 2.6 x 1.4 x 1.6 cm. Normal appearance/no adnexal mass. Pulsed Doppler evaluation of both ovaries demonstrates normal low-resistance arterial and venous waveforms. Other findings No abnormal free fluid. IMPRESSION: Normal pelvic ultrasound.  No acute abnormality identified. Electronically Signed   By: Rise Mu M.D.   On: 08/26/2016 20:54   Ct Abdomen Pelvis W Contrast  Result Date: 08/26/2016 CLINICAL DATA:  Ct a/p iso 300, Pt c/o RLQ abd pain with vaginal bleeding x 2 days worse with urination, vomiting, diarrhea ^187mL ISOVUE-300 IOPAMIDOL (ISOVUE-300) INJECTION 61% EXAM: CT ABDOMEN AND PELVIS WITH CONTRAST TECHNIQUE: Multidetector CT imaging of the abdomen and pelvis was performed using the standard protocol following bolus administration of intravenous contrast. CONTRAST:  ISOVUE-300 IOPAMIDOL (ISOVUE-300) INJECTION 61% COMPARISON:  08/27/2015 FINDINGS: Lower chest: No acute abnormality. Hepatobiliary: No focal liver abnormality is seen. No gallstones, gallbladder wall thickening, or biliary dilatation. Pancreas: Unremarkable. No pancreatic ductal dilatation or surrounding inflammatory changes. Spleen: Normal in size without focal abnormality. Adrenals/Urinary Tract: Adrenal glands are normal. No renal mass or hydronephrosis. Urinary bladder is unremarkable in appearance. Stomach/Bowel: Stomach and small bowel loops are normal in appearance. The appendix is well seen and has a normal appearance. Colon is normal in appearance.  Vascular/Lymphatic: No evidence for aortic aneurysm. No retroperitoneal or mesenteric adenopathy. Reproductive: Uterus is present. No adnexal mass. There are numerous slightly prominent pelvic veins, raising the question of pelvic congestion syndrome. Other: No free pelvic fluid. Anterior abdominal wall is unremarkable. Musculoskeletal: No acute or significant osseous findings. IMPRESSION: 1. Prominent pelvic veins, raising question of pelvic congestion syndrome. No adnexal mass. 2. No bowel obstruction.  Normal appendix. Electronically Signed   By: Norva Pavlov M.D.   On: 08/26/2016 20:10   [place] Art/ven Flow Abd Pelv Doppler  Result Date: 08/26/2016 CLINICAL DATA:  Initial evaluation for acute right lower quadrant pain. EXAM: TRANSABDOMINAL AND TRANSVAGINAL ULTRASOUND OF PELVIS DOPPLER ULTRASOUND OF OVARIES TECHNIQUE: Both transabdominal and transvaginal ultrasound examinations  of the pelvis were performed. Transabdominal technique was performed for global imaging of the pelvis including uterus, ovaries, adnexal regions, and pelvic cul-de-sac. It was necessary to proceed with endovaginal exam following the transabdominal exam to visualize the uterus and ovaries. Color and duplex Doppler ultrasound was utilized to evaluate blood flow to the ovaries. COMPARISON:  None. FINDINGS: Uterus Measurements: 6.8 x 3.1 x 4.9 cm. No fibroids or other mass visualized. Endometrium Thickness: 1.8 mm.  No focal abnormality visualized. Right ovary Measurements: 1.5 x 2.7 x 1.5 cm. Normal appearance/no adnexal mass. Left ovary Measurements: 2.6 x 1.4 x 1.6 cm. Normal appearance/no adnexal mass. Pulsed Doppler evaluation of both ovaries demonstrates normal low-resistance arterial and venous waveforms. Other findings No abnormal free fluid. IMPRESSION: Normal pelvic ultrasound.  No acute abnormality identified. Electronically Signed   By: Rise Mu M.D.   On: 08/26/2016 20:54    Procedures Procedures (including  critical care time)  Medications Ordered in ED Medications  ketorolac (TORADOL) 30 MG/ML injection 30 mg (30 mg Intravenous Given 08/26/16 1937)  iopamidol (ISOVUE-300) 61 % injection 100 mL (100 mLs Intravenous Contrast Given 08/26/16 1946)     Initial Impression / Assessment and Plan / ED Course  I have reviewed the triage vital signs and the nursing notes.  Pertinent labs & imaging results that were available during my care of the patient were reviewed by me and considered in my medical decision making (see chart for details).     Vitals:   08/26/16 1423 08/26/16 1657 08/26/16 1900 08/26/16 1930  BP: (!) 152/93 (!) 140/93 (!) 141/92 (!) 134/91  Pulse: 92 74 74 66  Resp: 17 18    Temp: 99.1 F (37.3 C) 98.9 F (37.2 C)    TempSrc: Oral Oral    SpO2: 100% 100% 100% 100%    Medications  ketorolac (TORADOL) 30 MG/ML injection 30 mg (30 mg Intravenous Given 08/26/16 1937)  iopamidol (ISOVUE-300) 61 % injection 100 mL (100 mLs Intravenous Contrast Given 08/26/16 1946)    Monique Lyons is 31 y.o. female presenting with Right lower quadrant abdominal pain, abnormal vaginal bleeding. This patient is also depressed with no suicidal ideation, homicidal ideation, auditory or visual hallucinations. I think this patient would meet inpatient criteria however TTS is consulted at this patient's request.  Pelvic exam reassuring. CT and pelvic ultrasound also reassuring however they do note a dilated pelvic vein consistent with pelvic congestion syndrome. This is likely the cause of her discomfort. Patient is given referral to OB/GYN. Patient states that she does not want to wait for TTS consult but instead will see [facility] in the morning. I think she is safe for discharge home. I also encouraged her to follow closely with her primary care.  Evaluation does not show pathology that would require ongoing emergent intervention or inpatient treatment. Pt is hemodynamically stable and mentating  appropriately. Discussed findings and plan with patient/guardian, who agrees with care plan. All questions answered. Return precautions discussed and outpatient follow up given.      Final Clinical Impressions(s) / ED Diagnoses   Final diagnoses:  RLQ abdominal pain  DUB (dysfunctional uterine bleeding)  Pelvic congestion syndrome  Depression, unspecified depression type    New Prescriptions New Prescriptions   No medications on file     Kaylyn Lim 08/26/16 2255    Nira Conn, MD 08/27/16 [phone]

## 2016-08-26 NOTE — ED Notes (Signed)
BH assessment called to notify that pt is ready for TTS

## 2016-08-26 NOTE — ED Notes (Signed)
Pelvic cart @ bedside.  

## 2016-08-26 NOTE — ED Triage Notes (Signed)
Pt c/o RLQ abd pain with vaginal bleeding x 2 days worse with urination, pt states, "I had mucus when I wiped yesterday." pt reports recent ETOH use, pt reports n/v/d, pt reports x 5 diarrhea episodes with x6 vomiting episodes, A&O x4

## 2016-08-26 NOTE — ED Notes (Signed)
Patient transported to Ultrasound 

## 2016-08-26 NOTE — Discharge Instructions (Signed)
[facility].  Please follow with your primary care doctor in the next 2 days for a check-up. They must obtain records for further management.   Do not hesitate to return to the Emergency Department for any new, worsening or concerning symptoms.   For pain control you may take:  800mg  of ibuprofen (that is usually 4 over the counter pills)  3 times a day (take with food) and acetaminophen 975mg  (this is 3 over the counter pills) four times a day. Do not drink alcohol or combine with other medications that have acetaminophen as an ingredient (Read the labels!).  For breakthrough pain you may take Tramadol. Do not drink alcohol drive or operate heavy machinery when taking Tramadol.

## 2016-08-26 NOTE — ED Notes (Signed)
Patient transported to CT 

## 2016-08-26 NOTE — BH Assessment (Signed)
Attempted to consult; however, client is getting an ultrasound.  Nurse was informed the clinician will follow up later. EW 8:20 pm

## 2016-08-27 LAB — RPR: RPR Ser Ql: NONREACTIVE

## 2016-08-27 LAB — GC/CHLAMYDIA PROBE AMP (~~LOC~~) NOT AT ARMC
Chlamydia: NEGATIVE
NEISSERIA GONORRHEA: NEGATIVE

## 2016-08-27 LAB — HIV ANTIBODY (ROUTINE TESTING W REFLEX): HIV SCREEN 4TH GENERATION: NONREACTIVE

## 2016-10-27 ENCOUNTER — Inpatient Hospital Stay (HOSPITAL_COMMUNITY): Payer: Self-pay

## 2016-10-27 ENCOUNTER — Inpatient Hospital Stay (HOSPITAL_COMMUNITY)
Admission: AD | Admit: 2016-10-27 | Discharge: 2016-10-28 | Disposition: A | Discharge status: Home / Self Care | Payer: Self-pay | Source: Ambulatory Visit | Attending: Family Medicine | Admitting: Family Medicine

## 2016-10-27 ENCOUNTER — Encounter (HOSPITAL_COMMUNITY): Payer: Self-pay | Admitting: *Deleted

## 2016-10-27 DIAGNOSIS — G8929 Other chronic pain: Secondary | ICD-10-CM | POA: Insufficient documentation

## 2016-10-27 DIAGNOSIS — R102 Pelvic and perineal pain unspecified side: Secondary | ICD-10-CM

## 2016-10-27 DIAGNOSIS — Z880 Allergy status to penicillin: Secondary | ICD-10-CM | POA: Insufficient documentation

## 2016-10-27 DIAGNOSIS — F17210 Nicotine dependence, cigarettes, uncomplicated: Secondary | ICD-10-CM | POA: Insufficient documentation

## 2016-10-27 HISTORY — DX: Essential (primary) hypertension: I10

## 2016-10-27 LAB — COMPREHENSIVE METABOLIC PANEL
ALBUMIN: 3.8 g/dL (ref 3.5–5.0)
ALT: 14 U/L (ref 14–54)
ANION GAP: 7 (ref 5–15)
AST: 15 U/L (ref 15–41)
Alkaline Phosphatase: 48 U/L (ref 38–126)
BUN: 10 mg/dL (ref 6–20)
CHLORIDE: 103 mmol/L (ref 101–111)
CO2: 27 mmol/L (ref 22–32)
Calcium: 8.8 mg/dL — ABNORMAL LOW (ref 8.9–10.3)
Creatinine, Ser: 0.66 mg/dL (ref 0.44–1.00)
GFR calc non Af Amer: >60 mL/min (ref >60)
GLUCOSE: 99 mg/dL (ref 65–99)
POTASSIUM: 3.8 mmol/L (ref 3.5–5.1)
SODIUM: 137 mmol/L (ref 135–145)
Total Bilirubin: 0.3 mg/dL (ref 0.3–1.2)
Total Protein: 6.9 g/dL (ref 6.5–8.1)

## 2016-10-27 LAB — URINALYSIS, ROUTINE W REFLEX MICROSCOPIC
BILIRUBIN URINE: NEGATIVE
Glucose, UA: NEGATIVE mg/dL
Hgb urine dipstick: NEGATIVE
Ketones, ur: NEGATIVE mg/dL
Nitrite: NEGATIVE
PH: 5 (ref 5.0–8.0)
Protein, ur: NEGATIVE mg/dL
SPECIFIC GRAVITY, URINE: 1.023 (ref 1.005–1.030)

## 2016-10-27 LAB — LIPASE, BLOOD: Lipase: 33 U/L (ref 11–51)

## 2016-10-27 LAB — CBC
HEMATOCRIT: 39.7 % (ref 36.0–46.0)
HEMOGLOBIN: 13 g/dL (ref 12.0–15.0)
MCH: 31.2 pg (ref 26.0–34.0)
MCHC: 32.7 g/dL (ref 30.0–36.0)
MCV: 95.2 fL (ref 78.0–100.0)
Platelets: 218 10*3/uL (ref 150–400)
RBC: 4.17 MIL/uL (ref 3.87–5.11)
RDW: 13.2 % (ref 11.5–15.5)
WBC: 8.3 10*3/uL (ref 4.0–10.5)

## 2016-10-27 LAB — WET PREP, GENITAL
SPERM: NONE SEEN
Trich, Wet Prep: NONE SEEN
Yeast Wet Prep HPF POC: NONE SEEN

## 2016-10-27 LAB — POCT PREGNANCY, URINE: Preg Test, Ur: NEGATIVE

## 2016-10-27 MED ORDER — KETOROLAC TROMETHAMINE 60 MG/2ML IM SOLN
60.0000 mg | Freq: Once | INTRAMUSCULAR | Status: AC
Start: 2016-10-27 — End: 2016-10-27
  Administered 2016-10-27: 60 mg via INTRAMUSCULAR
  Filled 2016-10-27: qty 2

## 2016-10-27 MED ORDER — CEFTRIAXONE SODIUM 250 MG IJ SOLR
250.0000 mg | Freq: Once | INTRAMUSCULAR | Status: AC
  Administered 2016-10-27: 250 mg via INTRAMUSCULAR
  Filled 2016-10-27: qty 250

## 2016-10-27 MED ORDER — AZITHROMYCIN 250 MG PO TABS
1000.0000 mg | ORAL_TABLET | Freq: Once | ORAL | Status: AC
Start: 2016-10-27 — End: 2016-10-27
  Administered 2016-10-27: 1000 mg via ORAL
  Filled 2016-10-27: qty 4

## 2016-10-27 NOTE — MAU Note (Signed)
C/o pelvic pain for the past few days. Went to [facility] and told she had pelvic congestion.. Period stopped 4 days ago, Started having some vaginal bleeding yesterday/. Abd pain is much worse can bearly move.

## 2016-10-27 NOTE — MAU Note (Signed)
PT  SAYS SHE HAS VAG  BLEEDING    WHEN  SHE WIPES -    STARTED    IN July-  WENT  TO  [facility]-  TOLD  HER  IT WAS PELVIC CONGESTION -  HAS FIBROIDS.      TODAY - NO PAIN MED.

## 2016-10-27 NOTE — Discharge Instructions (Signed)
In late 2019, the [facility] will be moving to the [place] campus. At that time, the MAU will no longer serve non-pregnant patients. We encourage you to establish care with a provider before that time, so that you can be seen with any GYN concerns, like vaginal discharge, urinary tract infection, etc.. in a timely manner. In order to make the office visit more convenient, the [facility] will be offering evening hours from 4pm-8pm on Mondays starting 10/13/16. There will be same-day appointments, walk-in appointments and scheduled appointments available during this time.   ° °[facility] - [phone] ° °For urgent needs, [facility] is also available for management of urgent GYN complaints such as vaginal discharge.  ° °[employer] extends eligibility for family planning services to reduce unintended pregnancies and improve the well-being of children and families. ° ° Eligible individuals whose income is at or below 195% of the federal poverty level and who are:  °- [place] citizens, documented immigrants or qualified aliens;  °- Residents of [place];  °- Not incarcerated; and  °- Not pregnant.  ° °[employer] Contact Information:  °Medical Assistance Clinical Section °Phone: [phone] °Email: dma.besmart@[facility].[place].gov  ° ° ° °

## 2016-10-27 NOTE — MAU Provider Note (Signed)
Chief Complaint: Abdominal Pain   First Provider Initiated Contact with Patient 10/27/16 2121      SUBJECTIVE HPI: Monique Lyons is a 31 y.o. [id number] who presents to maternity admissions reporting acute episode of a recurring RLQ/pelvic pain x 1 year.  This episode started 3-4 days ago and has been worsening. It is constant pain in her right lower abdomen and pelvis that is sharp and feels like something is exploding inside. She is tearful and reports the pain is too much to walk and she cannot work.  She was last seen for similar pain on 08/26/16 at [facility] and had normal pelvic US. She reports she was told she had a large uterine fibroid at that visit but did not follow up with [facility] office as directed. She reports her periods are light and irregular, and are associated with an increase in this pain but the pain occurs without menstrual bleeding, as this episode is not associated with any bleeding. She tried Tylenol and Aleve yesterday but has not taken any medications today.  Nothing makes this pain better.  There are no other associated symptoms. She denies vaginal bleeding, vaginal itching/burning, urinary symptoms, h/a, dizziness, n/v, or fever/chills.     HPI  Past Medical History:  Diagnosis Date  . Bilateral posterior subcapsular polar cataract   . Fibromyalgia   . Hypertension    notified of elevated BP in Jluly 2018  . No pertinent past medical history   . Ovarian cyst    Past Surgical History:  Procedure Laterality Date  . CESAREAN SECTION     first was c-section; second vbac  . CESAREAN SECTION  December 28, 2003  . TUBAL LIGATION Bilateral 10/17/2012   Procedure: POST PARTUM TUBAL LIGATION bilateral;  Surgeon: Lazaro Arms, MD;  Location: [facility];  Service: Gynecology;  Laterality: Bilateral;   Social History   Social History  . Marital status: Single    Spouse name: N/A  . Number of children: N/A  . Years of education: N/A   Occupational History  . Not on file.    Social History Main Topics  . Smoking status: Current Every Day Smoker    Packs/day: 0.50    Types: Cigarettes  . Smokeless tobacco: Never Used  . Alcohol use 1.8 oz/week    2 Cans of beer, 1 Standard drinks or equivalent per week     Comment: occas  . Drug use: No  . Sexual activity: Yes    Birth control/ protection: Surgical   Other Topics Concern  . Not on file   Social History Narrative  . No narrative on file   No current facility-administered medications on file prior to encounter.    Current Outpatient Prescriptions on File Prior to Encounter  Medication Sig Dispense Refill  . acetaminophen (TYLENOL) 500 MG tablet Take 1,000 mg by mouth every 6 (six) hours as needed for headache (pain).    . Multiple Vitamin (MULTIVITAMIN WITH MINERALS) TABS tablet Take 1 tablet by mouth daily.    Marland Kitchen omega-3 acid ethyl esters (LOVAZA) 1 g capsule Take 1 g by mouth daily.     Allergies  Allergen Reactions  . Amoxicillin Other (See Comments)    Patient believes that her reaction may be a rash, but not sure.  Her mom told her that she had a reaction to this medication.  Marland Kitchen Penicillins Rash        ROS:  Review of Systems  Constitutional: Negative for chills, fatigue and  fever.  Respiratory: Negative for shortness of breath.   Cardiovascular: Negative for chest pain.  Gastrointestinal: Positive for abdominal pain. Negative for nausea and vomiting.  Genitourinary: Positive for pelvic pain and vaginal discharge. Negative for difficulty urinating, dysuria, flank pain, vaginal bleeding and vaginal pain.  Neurological: Negative for dizziness and headaches.  Psychiatric/Behavioral: Negative.      I have reviewed patient's Past Medical Hx, Surgical Hx, Family Hx, Social Hx, medications and allergies.   Physical Exam   Patient Vitals for the past 24 hrs:  BP Temp Temp src Pulse Resp Height Weight  10/27/16 2348 134/86 98.5 F (36.9 C) Oral 64 (!) 64 - -  10/27/16 2034 (!) 142/94 - -  - - - -  10/27/16 2031 (!) 125/96 - - 88 16 - -  10/27/16 1546 131/79 98.1 F (36.7 C) - 83 18  (1.753 m) 160 lb (72.6 kg)   Constitutional: Well-developed, well-nourished female in no acute distress.  Cardiovascular: normal rate Respiratory: normal effort GI: Abd soft, non-tender. Pos BS x 4 MS: Extremities nontender, no edema, normal ROM Neurologic: Alert and oriented x 4.  GU: Neg CVAT.  PELVIC EXAM: Cervix pink, visually closed, without lesion,moderate thick yellow discharge, vaginal walls and external genitalia normal Bimanual exam: Cervix 0/long/high, firm, anterior, mild CMT, uterus nontender, nonenlarged, adnexa with significant tenderness on right side, none on left, no enlargement or mass bilaterally   LAB RESULTS Results for orders placed or performed during the hospital encounter of 10/27/16 (from the past 24 hour(s))  Urinalysis, Routine w reflex microscopic     Status: Abnormal   Collection Time: 10/27/16  3:47 PM  Result Value Ref Range   Color, Urine YELLOW YELLOW   APPearance CLOUDY (A) CLEAR   Specific Gravity, Urine 1.023 1.005 - 1.030   pH 5.0 5.0 - 8.0   Glucose, UA NEGATIVE NEGATIVE mg/dL   Hgb urine dipstick NEGATIVE NEGATIVE   Bilirubin Urine NEGATIVE NEGATIVE   Ketones, ur NEGATIVE NEGATIVE mg/dL   Protein, ur NEGATIVE NEGATIVE mg/dL   Nitrite NEGATIVE NEGATIVE   Leukocytes, UA LARGE (A) NEGATIVE   RBC / HPF 0-5 0 - 5 RBC/hpf   WBC, UA TOO NUMEROUS TO COUNT 0 - 5 WBC/hpf   Bacteria, UA RARE (A) NONE SEEN   Squamous Epithelial / LPF 6-30 (A) NONE SEEN   WBC Clumps PRESENT    Mucus PRESENT   Pregnancy, urine POC     Status: None   Collection Time: 10/27/16  4:11 PM  Result Value Ref Range   Preg Test, Ur NEGATIVE NEGATIVE  CBC     Status: None   Collection Time: 10/27/16  9:12 PM  Result Value Ref Range   WBC 8.3 4.0 - 10.5 K/uL   RBC 4.17 3.87 - 5.11 MIL/uL   Hemoglobin 13.0 12.0 - 15.0 g/dL   HCT 82.9 56.2 - 13.0 %   MCV 95.2 78.0 -  100.0 fL   MCH 31.2 26.0 - 34.0 pg   MCHC 32.7 30.0 - 36.0 g/dL   RDW 86.5 78.4 - 69.6 %   Platelets 218 150 - 400 K/uL  Comprehensive metabolic panel     Status: Abnormal   Collection Time: 10/27/16  9:12 PM  Result Value Ref Range   Sodium 137 135 - 145 mmol/L   Potassium 3.8 3.5 - 5.1 mmol/L   Chloride 103 101 - 111 mmol/L   CO2 27 22 - 32 mmol/L   Glucose, Bld 99 65 - 99  mg/dL   BUN 10 6 - 20 mg/dL   Creatinine, Ser 1.61 0.44 - 1.00 mg/dL   Calcium 8.8 (L) 8.9 - 10.3 mg/dL   Total Protein 6.9 6.5 - 8.1 g/dL   Albumin 3.8 3.5 - 5.0 g/dL   AST 15 15 - 41 U/L   ALT 14 14 - 54 U/L   Alkaline Phosphatase 48 38 - 126 U/L   Total Bilirubin 0.3 0.3 - 1.2 mg/dL   GFR calc non Af Amer >60 >60 mL/min   GFR calc Af Amer >60 >60 mL/min   Anion gap 7 5 - 15  Lipase, blood     Status: None   Collection Time: 10/27/16  9:12 PM  Result Value Ref Range   Lipase 33 11 - 51 U/L  Wet prep, genital     Status: Abnormal   Collection Time: 10/27/16  9:30 PM  Result Value Ref Range   Yeast Wet Prep HPF POC NONE SEEN NONE SEEN   Trich, Wet Prep NONE SEEN NONE SEEN   Clue Cells Wet Prep HPF POC PRESENT (A) NONE SEEN   WBC, Wet Prep HPF POC MANY (A) NONE SEEN   Sperm NONE SEEN        IMAGING US Pelvis Transvanginal Non-ob (tv Only)  Result Date: 10/27/2016 CLINICAL DATA:  Initial evaluation for acute pelvic pain for 4 days. EXAM: TRANSABDOMINAL AND TRANSVAGINAL ULTRASOUND OF PELVIS TECHNIQUE: Both transabdominal and transvaginal ultrasound examinations of the pelvis were performed. Transabdominal technique was performed for global imaging of the pelvis including uterus, ovaries, adnexal regions, and pelvic cul-de-sac. It was necessary to proceed with endovaginal exam following the transabdominal exam to visualize the uterus and ovaries. COMPARISON:  Prior ultrasound from 08/26/2016. FINDINGS: Uterus Measurements: 7.7 x 3.4 x 4.5 cm. No fibroids or other mass visualized. Endometrium Thickness: 6.3  mm.  No focal abnormality visualized. Right ovary Measurements: 2.6 x 1.3 x 2.6 cm. Normal appearance/no adnexal mass. Left ovary Measurements: 2.8 x 1.8 x 2.4 cm. Normal appearance/no adnexal mass. Other findings Trace free fluid within the pelvis, likely physiologic. IMPRESSION: Normal pelvic ultrasound.  No acute abnormality identified. Electronically Signed   By: Rise Mu M.D.   On: 10/27/2016 22:15    MAU Management/MDM: Ordered labs and reviewed results. No evidence of ovarian torsion on normal pelvic US.  Many WBCs on wet prep and CMT so will treat for PID today with Rocephin and azithromycin. Second dose of azithromycin sent as Rx to complete lower cost alternative treatment. Flagyl 500 mg BID x 7 days also.  Pain is chronic so consider other differential diagnoses including endometriosis.  Pt to follow up in [facility] for further evaluation of pain.  Toradol 60 mg IM x 1 dose in MAU with reduction of pain from 10/10 to 2/10. Rx for Naproxen 550 BID PRN.  Pt stable at time of discharge.  ASSESSMENT 1. Chronic pelvic pain in female   2. Acute pelvic pain, female     PLAN Discharge home Allergies as of 10/28/2016      Reactions   Amoxicillin Other (See Comments)   Patient believes that her reaction may be a rash, but not sure.  Her mom told her that she had a reaction to this medication.   Penicillins Rash         Medication List    TAKE these medications   acetaminophen 500 MG tablet Commonly known as:  TYLENOL Take 1,000 mg by mouth every 6 (six) hours as  needed for headache (pain).   azithromycin 500 MG tablet Commonly known as:  ZITHROMAX Take 1000 mg dose all at one time on 11/03/16, one week from your dose in MAU   metroNIDAZOLE 500 MG tablet Commonly known as:  FLAGYL Take 1 tablet (500 mg total) by mouth 2 (two) times daily.   multivitamin with minerals Tabs tablet Take 1 tablet by mouth daily.   naproxen sodium 550 MG tablet Commonly known as:  ANAPROX  DS Take 1 tablet (550 mg total) by mouth 2 (two) times daily with a meal.   omega-3 acid ethyl esters 1 g capsule Commonly known as:  LOVAZA Take 1 g by mouth daily.            Discharge Care Instructions        Start     Ordered   10/28/16 0000  metroNIDAZOLE (FLAGYL) 500 MG tablet  2 times daily    Question:  Supervising Provider  Answer:  Tilda Burrow   10/28/16 0001   10/28/16 0000  azithromycin (ZITHROMAX) 500 MG tablet    Question:  Supervising Provider  Answer:  Tilda Burrow   10/28/16 0001   10/28/16 0000  Discharge patient    Question Answer Comment  Discharge disposition 01-Home or Self Care   Discharge patient date 10/28/2016      10/28/16 0001   10/27/16 0000  naproxen sodium (ANAPROX DS) 550 MG tablet  2 times daily with meals    Question:  Supervising Provider  Answer:  Tilda Burrow   10/28/16 0001     Follow-up Information    [facility].   Specialty:  Obstetrics and Gynecology Why:  The office will call you with an appointment. Return to MAU as needed for emergencies. Contact information: [address] [place] [address] [phone]          Sharen Counter Certified Nurse-Midwife 10/28/2016  6:34 AM

## 2016-10-28 DIAGNOSIS — G8929 Other chronic pain: Secondary | ICD-10-CM

## 2016-10-28 DIAGNOSIS — R102 Pelvic and perineal pain: Secondary | ICD-10-CM

## 2016-10-28 LAB — GC/CHLAMYDIA PROBE AMP (~~LOC~~) NOT AT ARMC
CHLAMYDIA, DNA PROBE: NEGATIVE
NEISSERIA GONORRHEA: POSITIVE — AB

## 2016-10-28 MED ORDER — NAPROXEN SODIUM 550 MG PO TABS: 550.0000 mg | ORAL_TABLET | Freq: Two times a day (BID) | ORAL | 2 refills | Status: DC

## 2016-10-28 MED ORDER — AZITHROMYCIN 500 MG PO TABS: ORAL_TABLET | ORAL | 1 refills | Status: DC

## 2016-10-28 MED ORDER — METRONIDAZOLE 500 MG PO TABS: 500.0000 mg | ORAL_TABLET | Freq: Two times a day (BID) | ORAL | 0 refills | Status: DC

## 2016-10-29 LAB — URINE CULTURE: Culture: <10000 — AB

## 2016-11-19 ENCOUNTER — Ambulatory Visit: Payer: Self-pay | Admitting: Obstetrics and Gynecology

## 2016-11-19 ENCOUNTER — Encounter: Payer: Self-pay | Admitting: *Deleted

## 2016-11-19 NOTE — Progress Notes (Signed)
Monique LundJanet did not keep her scheduled appointment today for abd pain. Per Dr. Alysia PennaErvin do not need to call her; may reschedule if she calls.

## 2016-11-21 ENCOUNTER — Encounter (HOSPITAL_COMMUNITY): Payer: Self-pay | Admitting: Emergency Medicine

## 2016-11-21 ENCOUNTER — Emergency Department (HOSPITAL_COMMUNITY)
Admission: EM | Admit: 2016-11-21 | Discharge: 2016-11-21 | Disposition: A | Discharge status: Home / Self Care | Payer: No Typology Code available for payment source | Attending: Emergency Medicine | Admitting: Emergency Medicine

## 2016-11-21 DIAGNOSIS — Z5321 Procedure and treatment not carried out due to patient leaving prior to being seen by health care provider: Secondary | ICD-10-CM | POA: Insufficient documentation

## 2016-11-21 MED ORDER — PREDNISONE 20 MG PO TABS
60.0000 mg | ORAL_TABLET | Freq: Once | ORAL | Status: AC
  Administered 2016-11-21: 60 mg via ORAL
  Filled 2016-11-21: qty 3

## 2016-11-21 MED ORDER — DIPHENHYDRAMINE HCL 25 MG PO CAPS
25.0000 mg | ORAL_CAPSULE | Freq: Once | ORAL | Status: AC
  Administered 2016-11-21: 25 mg via ORAL
  Filled 2016-11-21: qty 1

## 2016-11-21 NOTE — ED Triage Notes (Signed)
Pt reports bee sting to upper R arm yesterday, reports general malaise worsening.  Pt appears anxious, denies itching o swelling to tongue, Iips, face.  Resp e/u.

## 2017-10-07 ENCOUNTER — Emergency Department (HOSPITAL_COMMUNITY)
Admission: EM | Admit: 2017-10-07 | Discharge: 2017-10-07 | Disposition: A | Discharge status: Home / Self Care | Payer: No Typology Code available for payment source | Attending: Emergency Medicine | Admitting: Emergency Medicine

## 2017-10-07 ENCOUNTER — Encounter (HOSPITAL_COMMUNITY): Payer: Self-pay

## 2017-10-07 DIAGNOSIS — S61512A Laceration without foreign body of left wrist, initial encounter: Secondary | ICD-10-CM | POA: Insufficient documentation

## 2017-10-07 DIAGNOSIS — X789XXA Intentional self-harm by unspecified sharp object, initial encounter: Secondary | ICD-10-CM

## 2017-10-07 DIAGNOSIS — F17210 Nicotine dependence, cigarettes, uncomplicated: Secondary | ICD-10-CM | POA: Insufficient documentation

## 2017-10-07 DIAGNOSIS — I10 Essential (primary) hypertension: Secondary | ICD-10-CM | POA: Insufficient documentation

## 2017-10-07 DIAGNOSIS — Z79899 Other long term (current) drug therapy: Secondary | ICD-10-CM | POA: Insufficient documentation

## 2017-10-07 DIAGNOSIS — Y998 Other external cause status: Secondary | ICD-10-CM | POA: Insufficient documentation

## 2017-10-07 DIAGNOSIS — Y939 Activity, unspecified: Secondary | ICD-10-CM | POA: Insufficient documentation

## 2017-10-07 DIAGNOSIS — Y929 Unspecified place or not applicable: Secondary | ICD-10-CM | POA: Insufficient documentation

## 2017-10-07 MED ORDER — CLINDAMYCIN HCL 150 MG PO CAPS: 300.0000 mg | ORAL_CAPSULE | Freq: Four times a day (QID) | ORAL | 0 refills | Status: DC

## 2017-10-07 MED ORDER — CLINDAMYCIN HCL 150 MG PO CAPS
300.0000 mg | ORAL_CAPSULE | Freq: Once | ORAL | Status: AC
  Administered 2017-10-07: 300 mg via ORAL
  Filled 2017-10-07: qty 2

## 2017-10-07 NOTE — Discharge Instructions (Signed)
Please read and follow all provided instructions.  Your diagnoses today include:  1. Self-inflicted laceration of wrist, left, initial encounter     Tests performed today include:  Vital signs. See below for your results today.   Medications prescribed:   Clindamycin - antibiotic  You have been prescribed an antibiotic medicine: take the entire course of medicine even if you are feeling better. Stopping early can cause the antibiotic not to work.  Take any prescribed medications only as directed.   Home care instructions:  Follow any educational materials and wound care instructions contained in this packet.   Keep affected area above the level of your heart when possible to minimize swelling. Wash area gently twice a day with warm soapy water. Do not apply alcohol or hydrogen peroxide. Cover the area if it draining or weeping.   Return instructions:  Return to the Emergency Department if you have:  Fever  Worsening pain  Worsening swelling of the wound  Pus draining from the wound  Redness of the skin that moves away from the wound, especially if it streaks away from the affected area   Any other emergent concerns  Your vital signs today were: BP (!) 138/95 (BP Location: Right Arm)    Pulse 79    Temp 98.8 F (37.1 C) (Oral)    Resp 18    SpO2 98%  If your blood pressure (BP) was elevated above 135/85 this visit, please have this repeated by your doctor within one month. --------------

## 2017-10-07 NOTE — ED Notes (Signed)
PA noted with patient.

## 2017-10-07 NOTE — ED Notes (Signed)
Wound cleaned with saline, guaze and dressed with non stickj dressing and polysporin. Pt tolerates well.

## 2017-10-07 NOTE — ED Notes (Addendum)
Pt noted as having changed from burgandy scrubs to personal clothing. PA sttes he told her she could do same. Pt alert and denies thoughts of self harm. Wound noted at left wrist without redness swelling drainage or bleeding. Wound appears several days old.
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
## 2018-03-18 ENCOUNTER — Emergency Department (HOSPITAL_COMMUNITY)
Admission: EM | Admit: 2018-03-18 | Discharge: 2018-03-18 | Disposition: A | Discharge status: Home / Self Care | Payer: Self-pay | Attending: Emergency Medicine | Admitting: Emergency Medicine

## 2018-03-18 ENCOUNTER — Emergency Department (HOSPITAL_COMMUNITY): Payer: Self-pay

## 2018-03-18 ENCOUNTER — Other Ambulatory Visit: Payer: Self-pay

## 2018-03-18 ENCOUNTER — Encounter (HOSPITAL_COMMUNITY): Payer: Self-pay | Admitting: *Deleted

## 2018-03-18 DIAGNOSIS — Y999 Unspecified external cause status: Secondary | ICD-10-CM | POA: Insufficient documentation

## 2018-03-18 DIAGNOSIS — I10 Essential (primary) hypertension: Secondary | ICD-10-CM | POA: Insufficient documentation

## 2018-03-18 DIAGNOSIS — Y9389 Activity, other specified: Secondary | ICD-10-CM | POA: Insufficient documentation

## 2018-03-18 DIAGNOSIS — Y92098 Other place in other non-institutional residence as the place of occurrence of the external cause: Secondary | ICD-10-CM | POA: Insufficient documentation

## 2018-03-18 DIAGNOSIS — W010XXA Fall on same level from slipping, tripping and stumbling without subsequent striking against object, initial encounter: Secondary | ICD-10-CM | POA: Insufficient documentation

## 2018-03-18 DIAGNOSIS — S93402A Sprain of unspecified ligament of left ankle, initial encounter: Secondary | ICD-10-CM | POA: Insufficient documentation

## 2018-03-18 DIAGNOSIS — F17210 Nicotine dependence, cigarettes, uncomplicated: Secondary | ICD-10-CM | POA: Insufficient documentation

## 2018-03-18 DIAGNOSIS — R55 Syncope and collapse: Secondary | ICD-10-CM | POA: Insufficient documentation

## 2018-03-18 LAB — BASIC METABOLIC PANEL
Anion gap: 7 (ref 5–15)
BUN: 8 mg/dL (ref 6–20)
CALCIUM: 9.2 mg/dL (ref 8.9–10.3)
CO2: 25 mmol/L (ref 22–32)
Chloride: 106 mmol/L (ref 98–111)
Creatinine, Ser: 0.74 mg/dL (ref 0.44–1.00)
GFR calc Af Amer: >60 mL/min (ref >60)
GFR calc non Af Amer: >60 mL/min (ref >60)
Glucose, Bld: 105 mg/dL — ABNORMAL HIGH (ref 70–99)
Potassium: 4.3 mmol/L (ref 3.5–5.1)
Sodium: 138 mmol/L (ref 135–145)

## 2018-03-18 LAB — CBC
HEMATOCRIT: 42.2 % (ref 36.0–46.0)
HEMOGLOBIN: 13.9 g/dL (ref 12.0–15.0)
MCH: 31.3 pg (ref 26.0–34.0)
MCHC: 32.9 g/dL (ref 30.0–36.0)
MCV: 95 fL (ref 80.0–100.0)
NRBC: 0 % (ref 0.0–0.2)
Platelets: 260 10*3/uL (ref 150–400)
RBC: 4.44 MIL/uL (ref 3.87–5.11)
RDW: 13 % (ref 11.5–15.5)
WBC: 8.5 10*3/uL (ref 4.0–10.5)

## 2018-03-18 LAB — I-STAT BETA HCG BLOOD, ED (MC, WL, AP ONLY): I-stat hCG, quantitative: <5 m[IU]/mL (ref <5)

## 2018-03-18 MED ORDER — SODIUM CHLORIDE 0.9 % IV BOLUS
1000.0000 mL | Freq: Once | INTRAVENOUS | Status: AC
  Administered 2018-03-18: 1000 mL via INTRAVENOUS

## 2018-03-18 NOTE — ED Notes (Signed)
Patient transported to X-ray 
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
## 2018-03-18 NOTE — ED Notes (Signed)
ED Provider at bedside. 

## 2018-03-18 NOTE — ED Provider Notes (Signed)
[facility] EMERGENCY DEPARTMENT Provider Note   CSN: [id number] Arrival date & time: 03/18/18  1450     History   Chief Complaint Chief Complaint  Patient presents with  . Ankle Pain    HPI Monique Lyons is a 33 y.o. female.  HPI Patient presents after a fall last night.  States she was at her grandparents home and felt lightheaded.  States she thinks it is from her pelvic congestion syndrome in the room being too hot.  States that she stood up and was more lightheaded and then passed out.  She rolled over her left ankle it happened.  Now complaining of pain in left foot.  States she felt her heart racing yesterday.  No fevers.  Denies pregnancy. Past Medical History:  Diagnosis Date  . Bilateral posterior subcapsular polar cataract   . Fibromyalgia   . Hypertension    notified of elevated BP in Jluly 2018  . No pertinent past medical history   . Ovarian cyst     Patient Active Problem List   Diagnosis Date Noted  . Chronic pelvic pain in female 10/27/2016  . Dysuria 11/15/2012  . Smoker 09/07/2012  . History of C-section 01/04/2011    Past Surgical History:  Procedure Laterality Date  . CESAREAN SECTION     first was c-section; second vbac  . CESAREAN SECTION  December 28, 2003  . TUBAL LIGATION Bilateral 10/17/2012   Procedure: POST PARTUM TUBAL LIGATION bilateral;  Surgeon: Lazaro Arms, MD;  Location: [facility];  Service: Gynecology;  Laterality: Bilateral;     OB History    Gravida  5   Para  4   Term  4   Preterm      AB  1   Living  4     SAB      TAB      Ectopic      Multiple      Live Births  4            Home Medications    Prior to Admission medications   Medication Sig Start Date End Date Taking? Authorizing Provider  acetaminophen (TYLENOL) 500 MG tablet Take 1,000 mg by mouth every 6 (six) hours as needed for headache (pain).    [provider]  azithromycin (ZITHROMAX) 500 MG tablet Take 1000  mg dose all at one time on 11/03/16, one week from your dose in MAU 10/28/16   Leftwich-Kirby, Misty Stanley A, CNM  clindamycin (CLEOCIN) 150 MG capsule Take 2 capsules (300 mg total) by mouth every 6 (six) hours. 10/07/17   Renne Crigler, PA-C  metroNIDAZOLE (FLAGYL) 500 MG tablet Take 1 tablet (500 mg total) by mouth 2 (two) times daily. 10/28/16   Leftwich-Kirby, Wilmer Floor, CNM  Multiple Vitamin (MULTIVITAMIN WITH MINERALS) TABS tablet Take 1 tablet by mouth daily.    [provider]  naproxen sodium (ANAPROX DS) 550 MG tablet Take 1 tablet (550 mg total) by mouth 2 (two) times daily with a meal. 10/27/16   Leftwich-Kirby, Wilmer Floor, CNM  omega-3 acid ethyl esters (LOVAZA) 1 g capsule Take 1 g by mouth daily.    [provider]    Family History Family History  Problem Relation Age of Onset  . Endometriosis Mother   . Heart murmur Mother     Social History Social History   Tobacco Use  . Smoking status: Current Every Day Smoker    Packs/day: 0.50  Types: Cigarettes  . Smokeless tobacco: Never Used  Substance Use Topics  . Alcohol use: Yes    Alcohol/week: 3.0 standard drinks    Types: 2 Cans of beer, 1 Standard drinks or equivalent per week    Comment: occas  . Drug use: No     Allergies   Amoxicillin and Penicillins   Review of Systems Review of Systems  Constitutional: Negative for appetite change and fever.  HENT: Negative for congestion.   Respiratory: Negative for shortness of breath.   Cardiovascular: Negative for chest pain.  Gastrointestinal: Negative for abdominal pain.  Genitourinary: Positive for pelvic pain.  Musculoskeletal: Negative for back pain.       Left ankle pain.  Skin: Negative for rash and wound.  Neurological: Positive for syncope.  Psychiatric/Behavioral: Negative for confusion.     Physical Exam Updated Vital Signs BP (!) 134/97   Pulse 76   Temp 98.4 F (36.9 C) (Oral)   Resp 17   Ht 5\' 5"  (1.651 m)   LMP 03/18/2018  (Approximate)   SpO2 98%   BMI 26.63 kg/m   Physical Exam HENT:     Head: Atraumatic.     Mouth/Throat:     Mouth: Mucous membranes are moist.  Neck:     Musculoskeletal: Neck supple.  Cardiovascular:     Rate and Rhythm: Regular rhythm.  Pulmonary:     Effort: Pulmonary effort is normal.  Abdominal:     Tenderness: There is no abdominal tenderness.  Musculoskeletal:     Comments: Tenderness over left ankle both laterally and medially and over left midfoot.  Sensation intact.  Pain with movement at ankle.  Skin:    Capillary Refill: Capillary refill takes less than 2 seconds.  Neurological:     General: No focal deficit present.     Mental Status: She is alert.      ED Treatments / Results  Labs (all labs ordered are listed, but only abnormal results are displayed) Labs Reviewed  BASIC METABOLIC PANEL - Abnormal; Notable for the following components:      Result Value   Glucose, Bld 105 (*)    All other components within normal limits  CBC  I-STAT BETA HCG BLOOD, ED ([facility], [facility], [facility] ONLY)    EKG EKG Interpretation  Date/Time:  Thursday March 18 2018 17:14:01 EST Ventricular Rate:  69 PR Interval:  148 QRS Duration: 80 QT Interval:  386 QTC Calculation: 413 R Axis:   83 Text Interpretation:  Sinus rhythm Otherwise normal ECG Confirmed by Benjiman CorePickering, Sheria Rosello [id number]) on 03/18/2018 5:22:12 PM   Radiology Dg Ankle Complete Left  Result Date: 03/18/2018 CLINICAL DATA:  Pt said she has "pelvic congestion" and does not get adequate blood flow to her left leg. Pt woke up in the middle of the night and passed out and fell when she stood up. Pain to dorsal foot/ankle. Slight swelling of the LEFT ankle. No prior surgery of the ankle. EXAM: LEFT ANKLE COMPLETE - 3+ VIEW COMPARISON:  None. FINDINGS: There is no evidence of fracture, dislocation, or joint effusion. There is no evidence of arthropathy or other focal bone abnormality. Soft tissues are unremarkable. IMPRESSION:  Negative. Electronically Signed   By: Norva PavlovElizabeth  Brown M.D.   On: 03/18/2018 16:08   Dg Foot Complete Left  Result Date: 03/18/2018 CLINICAL DATA:  Patient fell today now complaining of left lateral foot pain. EXAM: LEFT FOOT - COMPLETE 3+ VIEW COMPARISON:  None. FINDINGS: No fracture or bone lesion.  Joints normally spaced and aligned.  No arthropathic changes. Small plantar calcaneal spur. Soft tissues are unremarkable. IMPRESSION: No fracture or dislocation. Electronically Signed   By: Amie Portlandavid  Ormond M.D.   On: 03/18/2018 17:25    Procedures Procedures (including critical care time)  Medications Ordered in ED Medications  sodium chloride 0.9 % bolus 1,000 mL (1,000 mLs Intravenous New Bag/Given 03/18/18 1817)     Patient with syncopal episode at home.  Somewhat orthostatic here.  Fluid bolus given.  Lab work reassuring.  Tenderness over foot and ankle.  X-rays reassuring.  Discharge home after fluid bolus.  Final Clinical Impressions(s) / ED Diagnoses   Final diagnoses:  Syncope, unspecified syncope type  Sprain of left ankle, unspecified ligament, initial encounter    ED Discharge Orders    None       Benjiman CorePickering, Phylicia Mcgaugh, MD 03/18/18 1820

## 2018-03-18 NOTE — ED Notes (Signed)
Pt is back from X ray.

## 2018-03-18 NOTE — Discharge Instructions (Addendum)
Work-up today is reassuring.  You have an ankle sprain, but your x-ray showed no evidence of fracture.  Follow-up with your regular doctor.  Make sure you are drinking plenty of fluids.  Tylenol and ibuprofen as needed for pain issue.  If ankle sprain is not improving follow-up with your primary care doctor or orthopedics.

## 2018-07-14 ENCOUNTER — Encounter: Payer: Self-pay | Admitting: *Deleted

## 2018-11-05 ENCOUNTER — Other Ambulatory Visit: Payer: Self-pay

## 2018-11-05 ENCOUNTER — Emergency Department (HOSPITAL_COMMUNITY)
Admission: EM | Admit: 2018-11-05 | Discharge: 2018-11-05 | Disposition: A | Discharge status: Home / Self Care | Payer: HRSA Program | Attending: Emergency Medicine | Admitting: Emergency Medicine

## 2018-11-05 DIAGNOSIS — F17210 Nicotine dependence, cigarettes, uncomplicated: Secondary | ICD-10-CM | POA: Diagnosis not present

## 2018-11-05 DIAGNOSIS — Z20822 Contact with and (suspected) exposure to covid-19: Secondary | ICD-10-CM

## 2018-11-05 DIAGNOSIS — Z20828 Contact with and (suspected) exposure to other viral communicable diseases: Secondary | ICD-10-CM | POA: Diagnosis not present

## 2018-11-05 DIAGNOSIS — Z79899 Other long term (current) drug therapy: Secondary | ICD-10-CM | POA: Diagnosis not present

## 2018-11-05 DIAGNOSIS — R590 Localized enlarged lymph nodes: Secondary | ICD-10-CM | POA: Diagnosis not present

## 2018-11-05 DIAGNOSIS — I10 Essential (primary) hypertension: Secondary | ICD-10-CM | POA: Diagnosis not present

## 2018-11-05 DIAGNOSIS — M542 Cervicalgia: Secondary | ICD-10-CM | POA: Diagnosis present

## 2018-11-05 LAB — GROUP A STREP BY PCR: Group A Strep by PCR: NOT DETECTED

## 2018-11-05 NOTE — ED Provider Notes (Signed)
[facility] EMERGENCY [facility]PART[facility] Provider Note   CSN: [id number] Arrival date & time: 11/05/18  1557     History   Chief Complaint No chief complaint on file.   HPI Monique Lyons is a 33 y.o. female with history of chronic pelvic pain presents to the ER for exposure COVID-19 at work yesterday.  Patient states her coworker who works within [employer] feet of her recently came back from [place] and they were working together yesterday.  Her job notified her today that this person tested positive for COVID-19.  States for the last couple of days she is noticed swelling in her lymph nodes in her neck.  She has associated mild tenderness with palpation of the lymph nodes.  She has no frank sore throat.  Her chest feels cold but no pain.  No associated fevers, congestion, cough, shortness of breath, vomiting, diarrhea, abdominal pain, headaches, myalgias.  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  States "while she is here" she would like to get her right ovary checked out.  States this has been going on for several years.  Admits that her primary concern today is her exposure to COVID-19.    HPI  Past Medical History:  Diagnosis Date  . Bilateral posterior subcapsular polar cataract   . Fibromyalgia   . Hypertension    notified of elevated BP in Jluly 2018  . No pertinent past medical history   . Ovarian cyst     Patient Active Problem List   Diagnosis Date Noted  . Chronic pelvic pain in female 10/27/2016  . Dysuria 11/15/2012  . Smoker 09/07/2012  . History of C-section 01/04/2011    Past Surgical History:  Procedure Laterality Date  . CESAREAN SECTION     first was c-section; second vbac  . CESAREAN SECTION  December 28, 2003  . TUBAL LIGATION Bilateral 10/17/2012   Procedure: POST PARTUM TUBAL LIGATION bilateral;  Surgeon: Lazaro ArmsLuther H Eure, MD;  Location: [facility];  Service:  Gynecology;  Laterality: Bilateral;     OB History    Gravida  5   Para  4   Term  4   Preterm      AB  1   Living  4     SAB      TAB      Ectopic      Multiple      Live Births  4            Home Medications    Prior to Admission medications   Medication Sig Start Date End Date Taking? Authorizing Provider  acetaminophen (TYLENOL) 500 MG tablet Take 1,000 mg by mouth every 6 (six) hours as needed for headache (pain).    [provider]  azithromycin (ZITHROMAX) 500 MG tablet Take 1000 mg dose all at one time on 11/03/16, one week from your dose in MAU 10/28/16   Leftwich-Kirby, Misty StanleyLisa A, CNM  clindamycin (CLEOCIN) 150 MG capsule Take 2 capsules (300 mg total) by mouth every 6 (six) hours. 10/07/17   Renne CriglerGeiple, Joshua, PA-C  metroNIDAZOLE (FLAGYL) 500 MG tablet Take 1 tablet (500 mg total) by mouth 2 (two) times daily. 10/28/16   Leftwich-Kirby, Wilmer FloorLisa A, CNM  Multiple Vitamin (MULTIVITAMIN WITH MINERALS) TABS tablet Take 1 tablet by mouth daily.    [provider]  naproxen sodium (ANAPROX DS) 550 MG tablet Take 1  tablet (550 mg total) by mouth 2 (two) times daily with a meal. 10/27/16   Leftwich-Kirby, Kathie Dike, CNM  omega-3 acid ethyl esters (LOVAZA) 1 g capsule Take 1 g by mouth daily.    [provider]    Family History Family History  Problem Relation Age of Onset  . Endometriosis Mother   . Heart murmur Mother     Social History Social History   Tobacco Use  . Smoking status: Current Every Day Smoker    Packs/day: 0.50    Types: Cigarettes  . Smokeless tobacco: Never Used  Substance Use Topics  . Alcohol use: Yes    Alcohol/week: 3.0 standard drinks    Types: 2 Cans of beer, 1 Standard drinks or equivalent per week    Comment: occas  . Drug use: No     Allergies   Amoxicillin and Penicillins   Review of Systems Review of Systems  HENT:       Lymph node swelling in neck  All other systems reviewed and are negative.     Physical Exam Updated Vital Signs BP (!) 139/99 (BP Location: Left Arm)   Pulse 85   Temp 98.4 F (36.9 C) (Oral)   Resp 18   SpO2 100%   Physical Exam Vitals signs and nursing note reviewed.  Constitutional:      General: She is not in acute distress.    Appearance: She is well-developed.     Comments: NAD.  HENT:     Head: Normocephalic and atraumatic.     Right Ear: External ear normal.     Left Ear: External ear normal.     Nose: Nose normal.     Mouth/Throat:     Comments: Very mild oropharyngeal erythema.  Tonsils normal without hypertrophy, asymmetry, exudates, petechiae.  Soft palate is flat.  Uvula is midline.  Normal sublingual space.  Normal tongue protrusion.  Normal phonation.  Tolerating secretions. Eyes:     General: No scleral icterus.    Conjunctiva/sclera: Conjunctivae normal.  Neck:     Musculoskeletal: Normal range of motion and neck supple.     Comments: Mild, symmetric submandibular enlarged lymph nodes.  Trachea is midline.  No asymmetric anterior or lateral neck edema.  Full range of motion of the neck without any rigidity or pain. Cardiovascular:     Rate and Rhythm: Normal rate and regular rhythm.     Heart sounds: Normal heart sounds. No murmur.  Pulmonary:     Effort: Pulmonary effort is normal.     Breath sounds: Normal breath sounds. No wheezing.  Musculoskeletal: Normal range of motion.        General: No deformity.  Lymphadenopathy:     Cervical: Cervical adenopathy present.  Skin:    General: Skin is warm and dry.     Capillary Refill: Capillary refill takes less than 2 seconds.  Neurological:     Mental Status: She is alert and oriented to person, place, and time.  Psychiatric:        Behavior: Behavior normal.        Thought Content: Thought content normal.        Judgment: Judgment normal.      ED Treatments / Results  Labs (all labs ordered are listed, but only abnormal results are displayed) Labs Reviewed  GROUP A STREP  BY PCR  NOVEL CORONAVIRUS, NAA (HOSP ORDER, SEND-OUT TO REF LAB; TAT 18-24 HRS)    EKG None  Radiology No results found.  Procedures Procedures (including critical care time)  Medications Ordered in ED Medications - No data to display   Initial Impression / Assessment and Plan / ED Course  I have reviewed the triage vital signs and the nursing notes.  Pertinent labs & imaging results that were available during my care of the patient were reviewed by me and considered in my medical decision making (see chart for details).  33 year old here for COVID-19 testing after close exposure to person who tested positive for COVID-19.  Only symptoms today are mildly tender and enlarged submandibular lymph nodes.  Given symptomatology highest on DDX is viral pharyngitis, strep pharyngitis versus other viral URI/pharyngitis possibly COVID-19 infection.  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  Given her level of concern, cervical lymphadenopathy and family situation we will test her here for COVID-19.  Exam is otherwise benign.  Strep test is negative.  No asymmetry to tonsillar hypertrophy, uvula deviation, hot potato voice, trismus, or drooling to raise suspicion for deep neck space infection such as RPA, ludwig's or PTA. Doubt epiglottitis in this well appearing fully vaccinated patient.  Will discharge with symptomatic management. I have discussed signs and symptoms that would warrant immediate return to ER and patient verbalized understanding. She is tolerating secretions and PO in ER. COVID test pending at discharge. Symptomatic treatment, self isolation education and return precautions discussed with patient.   Final Clinical Impressions(s) / ED Diagnoses   Final diagnoses:  Cervical lymphadenopathy  Close exposure to COVID-19 virus    ED Discharge Orders    None       Liberty Handy, PA-C 11/05/18 2102    Rolan Bucco, MD 11/06/18  0003

## 2018-11-05 NOTE — Discharge Instructions (Addendum)
Strep test today is negative.  We tested you for COVID-19 and results will come back in 48 to 72 hours.  Results will be posted on MyChart.  It is also possible you could have other viral upper respiratory infection from another virus.    Treatment of your illness and symptoms will include self-isolation, monitoring of symptoms and supportive care with over-the-counter medicines  Return to the ED if there is increased work of breathing, shortness of breath, inability to tolerate fluids, weakness, chest pain, worsening sore throat, neck pain or stiffness, difficulty swallowing, drooling, muffled voice   If your test results are POSITIVE, the following isolation requirements need to be met to return to work and resume essential activities: At least 14 days since symptom onset  72 hours of absence of fever without antifever medicine (ibuprofen, acetaminophen). A fever is temperature of 100.66F or greater. Improvement of respiratory symptoms  If your test is NEGATIVE, you may return to work and essential activities as long as your symptoms have improved and you do not have a fever for a total of 3 days.  Call your job and notify them that your test result was negative to see if they will allow you to return to work.   Stay well-hydrated. Rest. You can use over the counter medications to help with symptoms: 600 mg ibuprofen (motrin, aleve, advil) or acetaminophen (tylenol) every 6 hours, around the clock to help with associated fevers, sore throat, headaches, generalized body aches and malaise.  Oxymetazoline (afrin) intranasal spray once daily for no more than 3 days to help with congestion, after 3 days you can switch to another over-the-counter nasal steroid spray such as fluticasone (flonase) Allergy medication (loratadine, cetirizine, etc) and phenylephrine (sudafed) help with nasal congestion, runny nose and postnasal drip.   Dextromethorphan (Delsym) to suppress dry cough. Frequent coughing is  likely causing your chest wall pain Wash your hands often to prevent spread.    Infection Prevention Recommendations for Individuals Confirmed to have, or Being Evaluated for, or have symptoms of 2019 Novel Coronavirus (COVID-19) Infection Who Receive Care at Home  Individuals who are confirmed to have, or are being evaluated for, COVID-19 should follow the prevention steps below until a healthcare provider or local or state health department says they can return to normal activities.  Stay home except to get medical care You should restrict activities outside your home, except for getting medical care. Do not go to work, school, or public areas, and do not use public transportation or taxis.  Call ahead before visiting your doctor Before your medical appointment, call the healthcare provider and tell them that you have, or are being evaluated for, COVID-19 infection. This will help the healthcare providers office take steps to keep other people from getting infected. Ask your healthcare provider to call the local or state health department.  Monitor your symptoms Seek prompt medical attention if your illness is worsening (e.g., difficulty breathing). Before going to your medical appointment, call the healthcare provider and tell them that you have, or are being evaluated for, COVID-19 infection. Ask your healthcare provider to call the local or state health department.  Wear a facemask You should wear a facemask that covers your nose and mouth when you are in the same room with other people and when you visit a healthcare provider. People who live with or visit you should also wear a facemask while they are in the same room with you.  Separate yourself from other people in  your home As much as possible, you should stay in a different room from other people in your home. Also, you should use a separate bathroom, if available.  Avoid sharing household items You should not share dishes,  drinking glasses, cups, eating utensils, towels, bedding, or other items with other people in your home. After using these items, you should wash them thoroughly with soap and water.  Cover your coughs and sneezes Cover your mouth and nose with a tissue when you cough or sneeze, or you can cough or sneeze into your sleeve. Throw used tissues in a lined trash can, and immediately wash your hands with soap and water for at least 20 seconds or use an alcohol-based hand rub.  Wash your [facility] your hands often and thoroughly with soap and water for at least 20 seconds. You can use an alcohol-based hand sanitizer if soap and water are not available and if your hands are not visibly dirty. Avoid touching your eyes, nose, and mouth with unwashed hands.   Prevention Steps for Caregivers and Household Members of Individuals Confirmed to have, or Being Evaluated for, or have symptoms of 2019 Novel Coronavirus (COVID-19) Infection Being Cared for in the Home  If you live with, or provide care at home for, a person confirmed to have, or being evaluated for, COVID-19 infection please follow these guidelines to prevent infection:  Follow healthcare providers instructions Make sure that you understand and can help the patient follow any healthcare provider instructions for all care.  Provide for the patients basic needs You should help the patient with basic needs in the home and provide support for getting groceries, prescriptions, and other personal needs.  Monitor the patients symptoms If they are getting sicker, call his or her medical provider and tell them that the patient has, or is being evaluated for, COVID-19 infection. This will help the healthcare providers office take steps to keep other people from getting infected. Ask the healthcare provider to call the local or state health department.  Limit the number of people who have contact with the patient If possible, have only one  caregiver for the patient. Other household members should stay in another home or place of residence. If this is not possible, they should stay in another room, or be separated from the patient as much as possible. Use a separate bathroom, if available. Restrict visitors who do not have an essential need to be in the home.  Keep older adults, very young children, and other sick people away from the patient Keep older adults, very young children, and those who have compromised immune systems or chronic health conditions away from the patient. This includes people with chronic heart, lung, or kidney conditions, diabetes, and cancer.  Ensure good ventilation Make sure that shared spaces in the home have good air flow, such as from an air conditioner or an opened window, weather permitting.  Wash your hands often Wash your hands often and thoroughly with soap and water for at least 20 seconds. You can use an alcohol based hand sanitizer if soap and water are not available and if your hands are not visibly dirty. Avoid touching your eyes, nose, and mouth with unwashed hands. Use disposable paper towels to dry your hands. If not available, use dedicated cloth towels and replace them when they become wet.  Wear a facemask and gloves Wear a disposable facemask at all times in the room and gloves when you touch or have contact with the  patients blood, body fluids, and/or secretions or excretions, such as sweat, saliva, sputum, nasal mucus, vomit, urine, or feces.  Ensure the mask fits over your nose and mouth tightly, and do not touch it during use. Throw out disposable facemasks and gloves after using them. Do not reuse. Wash your hands immediately after removing your facemask and gloves. If your personal clothing becomes contaminated, carefully remove clothing and launder. Wash your hands after handling contaminated clothing. Place all used disposable facemasks, gloves, and other waste in a lined  container before disposing them with other household waste. Remove gloves and wash your hands immediately after handling these items.  Do not share dishes, glasses, or other household items with the patient Avoid sharing household items. You should not share dishes, drinking glasses, cups, eating utensils, towels, bedding, or other items with a patient who is confirmed to have, or being evaluated for, COVID-19 infection. After the person uses these items, you should wash them thoroughly with soap and water.  Wash laundry thoroughly Immediately remove and wash clothes or bedding that have blood, body fluids, and/or secretions or excretions, such as sweat, saliva, sputum, nasal mucus, vomit, urine, or feces, on them. Wear gloves when handling laundry from the patient. Read and follow directions on labels of laundry or clothing items and detergent. In general, wash and dry with the warmest temperatures recommended on the label.  Clean all areas the individual has used often Clean all touchable surfaces, such as counters, tabletops, doorknobs, bathroom fixtures, toilets, phones, keyboards, tablets, and bedside tables, every day. Also, clean any surfaces that may have blood, body fluids, and/or secretions or excretions on them. Wear gloves when cleaning surfaces the patient has come in contact with. Use a diluted bleach solution (e.g., dilute bleach with 1 part bleach and 10 parts water) or a household disinfectant with a label that says EPA-registered for coronaviruses. To make a bleach solution at home, add 1 tablespoon of bleach to 1 quart (4 cups) of water. For a larger supply, add  cup of bleach to 1 gallon (16 cups) of water. Read labels of cleaning products and follow recommendations provided on product labels. Labels contain instructions for safe and effective use of the cleaning product including precautions you should take when applying the product, such as wearing gloves or eye protection and  making sure you have good ventilation during use of the product. Remove gloves and wash hands immediately after cleaning.  Monitor yourself for signs and symptoms of illness Caregivers and household members are considered close contacts, should monitor their health, and will be asked to limit movement outside of the home to the extent possible. Follow the monitoring steps for close contacts listed on the symptom monitoring form.  ? If you have additional questions, contact your local health department or call the epidemiologist on call at [phone] (available 24/7). ? This guidance is subject to change. For the most up-to-date guidance from [employer], please refer to their website: YouBlogs.pl

## 2018-11-05 NOTE — ED Triage Notes (Signed)
Pt here for covid test, sent from work bc one of her coworkers tested positive. Pt endorses sore throat. Pt also sts "while I'm here I'd like to get my R ovary checked out because it's been hurting a lot."

## 2018-11-07 LAB — NOVEL CORONAVIRUS, NAA (HOSP ORDER, SEND-OUT TO REF LAB; TAT 18-24 HRS): SARS-CoV-2, NAA: NOT DETECTED

## 2019-06-04 ENCOUNTER — Inpatient Hospital Stay (HOSPITAL_COMMUNITY)
Admission: AD | Admit: 2019-06-04 | Discharge: 2019-06-04 | Disposition: A | Discharge status: Home / Self Care | Payer: Medicaid Other | Attending: Obstetrics and Gynecology | Admitting: Obstetrics and Gynecology

## 2019-06-04 ENCOUNTER — Ambulatory Visit (HOSPITAL_COMMUNITY)
Admission: EM | Admit: 2019-06-04 | Discharge: 2019-06-04 | Disposition: A | Discharge status: Home / Self Care | Payer: Medicaid Other

## 2019-06-04 ENCOUNTER — Encounter (HOSPITAL_COMMUNITY): Payer: Self-pay

## 2019-06-04 ENCOUNTER — Other Ambulatory Visit: Payer: Self-pay

## 2019-06-04 DIAGNOSIS — R109 Unspecified abdominal pain: Secondary | ICD-10-CM

## 2019-06-04 DIAGNOSIS — Z3202 Encounter for pregnancy test, result negative: Secondary | ICD-10-CM | POA: Insufficient documentation

## 2019-06-04 DIAGNOSIS — R102 Pelvic and perineal pain: Secondary | ICD-10-CM

## 2019-06-04 DIAGNOSIS — R079 Chest pain, unspecified: Secondary | ICD-10-CM | POA: Insufficient documentation

## 2019-06-04 DIAGNOSIS — R1030 Lower abdominal pain, unspecified: Secondary | ICD-10-CM | POA: Insufficient documentation

## 2019-06-04 DIAGNOSIS — M545 Low back pain, unspecified: Secondary | ICD-10-CM

## 2019-06-04 DIAGNOSIS — I7300 Raynaud's syndrome without gangrene: Secondary | ICD-10-CM

## 2019-06-04 DIAGNOSIS — M25551 Pain in right hip: Secondary | ICD-10-CM

## 2019-06-04 LAB — POCT URINALYSIS DIP (DEVICE)
Bilirubin Urine: NEGATIVE
Glucose, UA: NEGATIVE mg/dL
Hgb urine dipstick: NEGATIVE
Ketones, ur: NEGATIVE mg/dL
Leukocytes,Ua: NEGATIVE
Nitrite: NEGATIVE
Protein, ur: NEGATIVE mg/dL
Specific Gravity, Urine: 1.015 (ref 1.005–1.030)
Urobilinogen, UA: 0.2 mg/dL (ref 0.0–1.0)
pH: 7 (ref 5.0–8.0)

## 2019-06-04 LAB — POCT PREGNANCY, URINE: Preg Test, Ur: NEGATIVE

## 2019-06-04 MED ORDER — NAPROXEN 500 MG PO TABS: 500.0000 mg | ORAL_TABLET | Freq: Two times a day (BID) | ORAL | 0 refills | Status: DC

## 2019-06-04 MED ORDER — CYCLOBENZAPRINE HCL 10 MG PO TABS: 10.0000 mg | ORAL_TABLET | Freq: Two times a day (BID) | ORAL | 0 refills | Status: DC | PRN

## 2019-06-04 MED ORDER — PREDNISONE 10 MG (21) PO TBPK: ORAL_TABLET | Freq: Every day | ORAL | 0 refills | Status: AC

## 2019-06-04 NOTE — ED Triage Notes (Signed)
Pt c/o RLQ and right lumbar pain for approx 1 month with nausea and reports pain to right leg and reports first two right toes feel "burning".  Also reports urinary frequency, pressure with urination. Reports h/o "pelvic congestion".  Reports that she was seen at [facility] hospital today for same c/c; and had pregnancy test completed (negative.) Denies fever, chills, v/d. Also c/o "swollen glands" under neck bilaterally. Has been taking Aleve, ibuprofen, Goody's for pain routinely.

## 2019-06-04 NOTE — ED Provider Notes (Signed)
[facility]   [id number] 06/04/19 Arrival Time: 0254   SUBJECTIVE: History from: patient. Monique Lyons is a 34 y.o. female complains of right lower pain that began one month ago.  Denies a precipitating event or specific injury.  Localizes the pain to the R low back that radiates to R hip and R leg.  Describes the pain as intermittent and achy in character.  Has tried OTC medications without relief.  Symptoms are made worse with being up on her feet.  Denies similar symptoms in the past.  Denies fever, chills, erythema, ecchymosis, effusion, weakness, numbness and tingling, saddle paresthesias, loss of bowel or bladder function.      ROS: As per HPI.  All other pertinent ROS negative.     Past Medical History:  Diagnosis Date  . Bilateral posterior subcapsular polar cataract   . Fibromyalgia   . Hypertension    notified of elevated BP in Jluly 2018  . No pertinent past medical history   . Ovarian cyst    Past Surgical History:  Procedure Laterality Date  . CESAREAN SECTION     first was c-section; second vbac  . CESAREAN SECTION  December 28, 2003  . TUBAL LIGATION Bilateral 10/17/2012   Procedure: POST PARTUM TUBAL LIGATION bilateral;  Surgeon: Florian Buff, MD;  Location: [facility];  Service: Gynecology;  Laterality: Bilateral;    No current facility-administered medications on file prior to encounter.   Current Outpatient Medications on File Prior to Encounter  Medication Sig Dispense Refill  . diphenhydrAMINE-APAP, sleep, (GOODYS PM PO) Take by mouth.    Marland Kitchen ibuprofen (ADVIL) 400 MG tablet Take 400 mg by mouth every 6 (six) hours as needed.    . naproxen sodium (ALEVE) 220 MG tablet Take 220 mg by mouth 2 (two) times daily as needed.    . Omega-3 Fatty Acids (FISH OIL) 500 MG CAPS Take by mouth.    Marland Kitchen acetaminophen (TYLENOL) 500 MG tablet Take 1,000 mg by mouth every 6 (six) hours as needed for headache (pain).    . Multiple Vitamin (MULTIVITAMIN WITH MINERALS) TABS  tablet Take 1 tablet by mouth daily.    Marland Kitchen omega-3 acid ethyl esters (LOVAZA) 1 g capsule Take 1 g by mouth daily.     Social History   Socioeconomic History  . Marital status: Single    Spouse name: Not on file  . Number of children: Not on file  . Years of education: Not on file  . Highest education level: Not on file  Occupational History  . Not on file  Tobacco Use  . Smoking status: Current Every Day Smoker    Packs/day: 0.50    Types: Cigarettes  . Smokeless tobacco: Never Used  Substance and Sexual Activity  . Alcohol use: Yes    Alcohol/week: 3.0 standard drinks    Types: 2 Cans of beer, 1 Standard drinks or equivalent per week    Comment: occas  . Drug use: No  . Sexual activity: Yes    Birth control/protection: Surgical  Other Topics Concern  . Not on file  Social History Narrative  . Not on file   Social Determinants of Health   Financial Resource Strain:   . Difficulty of Paying Living Expenses:   Food Insecurity:   . Worried About Charity fundraiser in the Last Year:   . Arboriculturist in the Last Year:   Transportation Needs:   . Film/video editor (Medical):   Marland Kitchen  Lack of Transportation (Non-Medical):   Physical Activity:   . Days of Exercise per Week:   . Minutes of Exercise per Session:   Stress:   . Feeling of Stress :   Social Connections:   . Frequency of Communication with Friends and Family:   . Frequency of Social Gatherings with Friends and Family:   . Attends Religious Services:   . Active Member of Clubs or Organizations:   . Attends Banker Meetings:   Marland Kitchen Marital Status:   Intimate Partner Violence:   . Fear of Current or Ex-Partner:   . Emotionally Abused:   Marland Kitchen Physically Abused:   . Sexually Abused:    Family History  Problem Relation Age of Onset  . Endometriosis Mother   . Heart murmur Mother     OBJECTIVE:  Vitals:   06/04/19 1639  BP: (!) 123/93  Pulse: 68  Resp: 18  Temp: 98.2 F (36.8 C)    TempSrc: Oral  SpO2: 100%    General appearance: ALERT; in no acute distress.  Head: NCAT Lungs: Normal respiratory effort CV: pulses 2+ bilaterally. Cap refill < 2 seconds Musculoskeletal: Positive straight leg raise on R side, negative straight leg raise on the L side.  Inspection: Skin warm, dry, clear and intact without obvious erythema, effusion, or ecchymosis.  Palpation: Nontender to palpation ROM: FROM active and passive Strength: 5/5 shld abduction, 5/5 shld adduction, 5/5 elbow flexion, 5/5 elbow extension, 5/5 grip strength, 5/5 hip flexion, 5/5 knee abduction, 5/5 knee adduction, 5/5 knee flexion, 5/5 knee extension, 5/5 dorsiflexion, 5/5 plantar flexion  Skin: warm and dry Neurologic: Ambulates without difficulty; Sensation intact about the upper/ lower extremities Psychological: alert and cooperative; normal mood and affect  DIAGNOSTIC STUDIES:  No results found.   ASSESSMENT & PLAN:  1. Pelvic pain in female   2. Right hip pain   3. Low back pain radiating to right lower extremity   4. Raynaud's phenomenon without gangrene      Meds ordered this encounter  Medications  . predniSONE (STERAPRED UNI-PAK 21 TAB) 10 MG (21) TBPK tablet    Sig: Take by mouth daily for 6 days. Take 6 tablets on day 1, 5 tablets on day 2, 4 tablets on day 3, 3 tablets on day 4, 2 tablets on day 5, 1 tablet on day 6    Dispense:  21 tablet    Refill:  0    Order Specific Question:   Supervising Provider    Answer:   Merrilee Jansky [id number]  . naproxen (NAPROSYN) 500 MG tablet    Sig: Take 1 tablet (500 mg total) by mouth 2 (two) times daily.    Dispense:  30 tablet    Refill:  0    Order Specific Question:   Supervising Provider    Answer:   Merrilee Jansky [id number]  . cyclobenzaprine (FLEXERIL) 10 MG tablet    Sig: Take 1 tablet (10 mg total) by mouth 2 (two) times daily as needed for muscle spasms.    Dispense:  20 tablet    Refill:  0    Order Specific Question:    Supervising Provider    Answer:   Merrilee Jansky [id number]    Continue conservative management of rest, ice, and gentle stretches Take naproxen as needed for pain relief (may cause abdominal discomfort, ulcers, and GI bleeds avoid taking with other NSAIDs) Take cyclobenzaprine at nighttime for symptomatic relief. Avoid driving or operating heavy machinery  while using medication. Also prescribed prednisone taper. Discussed with patient how to take this medication. UA in office not concerning for infection. Follow up with PCP if symptoms persist Return or go to the ER if you have any new or worsening symptoms (fever, chills, chest pain, abdominal pain, changes in bowel or bladder habits, pain radiating into lower legs, etc...)    Reviewed expectations re: course of current medical issues. Questions answered. Outlined signs and symptoms indicating need for more acute intervention. Patient verbalized understanding. After Visit Summary given.       Moshe Cipro, NP 06/07/19 2130

## 2019-06-04 NOTE — MAU Provider Note (Signed)
First Provider Initiated Contact with Patient 06/04/19 1513      S Ms. Monique Lyons is a 34 y.o. [id number] non-pregnant female who presents to MAU today with complaint of abdominal pain & chest pain. Symptoms started last night. Reports substernal chest burning & states she has a history of heartburn. Nauseated last night. Abdominal pain throughout lower abdomen. Denies fever/chills, vaginal bleeding, SOB, or cough.    O BP 127/81 (BP Location: Right Arm)   Pulse 79   Temp 98.4 F (36.9 C) (Oral)   Resp 16   Ht 5\' 11"  (1.803 m)   Wt 79.7 kg   LMP 05/14/2019   SpO2 99% Comment: room air  BMI 24.52 kg/m  Physical Exam  Nursing note and vitals reviewed. Constitutional: She appears well-developed and well-nourished. No distress.  HENT:  Head: Normocephalic and atraumatic.  Respiratory: Effort normal. No respiratory distress.  Skin: She is not diaphoretic.  Psychiatric: She has a normal mood and affect. Her behavior is normal. Judgment and thought content normal.    A Non pregnant female Medical screening exam complete 1. Negative pregnancy test   2. Abdominal pain in female      P Discharge from MAU in stable condition Patient given the option of transfer to [facility] for further evaluation or seek care in outpatient facility of choice List of options for follow-up given  Warning signs for worsening condition that would warrant emergency follow-up discussed Patient may return to MAU as needed for pregnancy related complaints  [facility], NP 06/04/2019 3:13 PM

## 2019-06-04 NOTE — MAU Note (Signed)
Monique Lyons is a 34 y.o. here in MAU reporting: lower abdominal pain since last night. Also having a burning sensation in her chest. Also having a white discharge.  LMP: 4/10  Onset of complaint: yesterday  Pain score: chest 7/10, abdominal 5/10  Vitals:   06/04/19 1500  BP: 127/81  Pulse: 79  Resp: 16  Temp: 98.4 F (36.9 C)  SpO2: 99%     Lab orders placed from triage: upt
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
## 2019-06-04 NOTE — Discharge Instructions (Signed)
Take the prescribed ibuprofen as needed for your pain.  Take the muscle relaxer Flexeril as needed for muscle spasm; do not drive, operate machinery, or drink alcohol with this medication as it may make you drowsy.    Follow up with an orthopedist if your pain is not improving.  Go to the emergency department if you have worsening pain or develop new symptoms such as difficulty with urination, weakness, numbness, loss of control of your bladder or bowels, fever, chills or other concerns.  

## 2021-05-01 ENCOUNTER — Other Ambulatory Visit: Payer: Self-pay

## 2021-05-01 ENCOUNTER — Inpatient Hospital Stay (HOSPITAL_COMMUNITY)
Admission: AD | Admit: 2021-05-01 | Payer: Federal, State, Local not specified - Other | Source: Home / Self Care | Admitting: Psychiatry

## 2021-05-01 ENCOUNTER — Inpatient Hospital Stay (HOSPITAL_COMMUNITY)
Admission: AD | Admit: 2021-05-01 | Discharge: 2021-05-06 | DRG: 885 | Disposition: A | Discharge status: Home / Self Care | Payer: Federal, State, Local not specified - Other | Attending: Psychiatry | Admitting: Psychiatry

## 2021-05-01 ENCOUNTER — Encounter (HOSPITAL_COMMUNITY): Payer: Self-pay | Admitting: Student

## 2021-05-01 DIAGNOSIS — Y906 Blood alcohol level of 120-199 mg/100 ml: Secondary | ICD-10-CM | POA: Diagnosis present

## 2021-05-01 DIAGNOSIS — F17210 Nicotine dependence, cigarettes, uncomplicated: Secondary | ICD-10-CM | POA: Diagnosis present

## 2021-05-01 DIAGNOSIS — Z23 Encounter for immunization: Secondary | ICD-10-CM

## 2021-05-01 DIAGNOSIS — F332 Major depressive disorder, recurrent severe without psychotic features: Principal | ICD-10-CM | POA: Diagnosis present

## 2021-05-01 DIAGNOSIS — Z20822 Contact with and (suspected) exposure to covid-19: Secondary | ICD-10-CM | POA: Diagnosis present

## 2021-05-01 DIAGNOSIS — K5900 Constipation, unspecified: Secondary | ICD-10-CM | POA: Diagnosis present

## 2021-05-01 DIAGNOSIS — X789XXA Intentional self-harm by unspecified sharp object, initial encounter: Secondary | ICD-10-CM | POA: Diagnosis present

## 2021-05-01 DIAGNOSIS — F4010 Social phobia, unspecified: Secondary | ICD-10-CM | POA: Diagnosis present

## 2021-05-01 DIAGNOSIS — G4700 Insomnia, unspecified: Secondary | ICD-10-CM | POA: Diagnosis present

## 2021-05-01 DIAGNOSIS — Z79899 Other long term (current) drug therapy: Secondary | ICD-10-CM | POA: Diagnosis not present

## 2021-05-01 DIAGNOSIS — Z9151 Personal history of suicidal behavior: Secondary | ICD-10-CM | POA: Diagnosis not present

## 2021-05-01 DIAGNOSIS — R600 Localized edema: Secondary | ICD-10-CM | POA: Diagnosis present

## 2021-05-01 DIAGNOSIS — F10239 Alcohol dependence with withdrawal, unspecified: Secondary | ICD-10-CM | POA: Diagnosis present

## 2021-05-01 DIAGNOSIS — F411 Generalized anxiety disorder: Secondary | ICD-10-CM | POA: Diagnosis present

## 2021-05-01 DIAGNOSIS — T1491XA Suicide attempt, initial encounter: Secondary | ICD-10-CM

## 2021-05-01 DIAGNOSIS — Z5900 Homelessness unspecified: Secondary | ICD-10-CM | POA: Diagnosis not present

## 2021-05-01 DIAGNOSIS — Z789 Other specified health status: Secondary | ICD-10-CM

## 2021-05-01 DIAGNOSIS — K30 Functional dyspepsia: Secondary | ICD-10-CM | POA: Diagnosis present

## 2021-05-01 LAB — TSH: TSH: 1.04 u[IU]/mL (ref 0.350–4.500)

## 2021-05-01 LAB — COMPREHENSIVE METABOLIC PANEL
ALT: 22 U/L (ref 0–44)
AST: 20 U/L (ref 15–41)
Albumin: 4 g/dL (ref 3.5–5.0)
Alkaline Phosphatase: 62 U/L (ref 38–126)
Anion gap: 6 (ref 5–15)
BUN: 12 mg/dL (ref 6–20)
CO2: 26 mmol/L (ref 22–32)
Calcium: 9 mg/dL (ref 8.9–10.3)
Chloride: 106 mmol/L (ref 98–111)
Creatinine, Ser: 0.75 mg/dL (ref 0.44–1.00)
GFR, Estimated: >60 mL/min (ref >60)
Glucose, Bld: 107 mg/dL — ABNORMAL HIGH (ref 70–99)
Potassium: 4.3 mmol/L (ref 3.5–5.1)
Sodium: 138 mmol/L (ref 135–145)
Total Bilirubin: 0.4 mg/dL (ref 0.3–1.2)
Total Protein: 7 g/dL (ref 6.5–8.1)

## 2021-05-01 LAB — CBC
HCT: 39.7 % (ref 36.0–46.0)
Hemoglobin: 13.2 g/dL (ref 12.0–15.0)
MCH: 32.4 pg (ref 26.0–34.0)
MCHC: 33.2 g/dL (ref 30.0–36.0)
MCV: 97.5 fL (ref 80.0–100.0)
Platelets: 262 10*3/uL (ref 150–400)
RBC: 4.07 MIL/uL (ref 3.87–5.11)
RDW: 12.5 % (ref 11.5–15.5)
WBC: 7.2 10*3/uL (ref 4.0–10.5)
nRBC: 0 % (ref 0.0–0.2)

## 2021-05-01 LAB — PREGNANCY, URINE: Preg Test, Ur: NEGATIVE

## 2021-05-01 LAB — RAPID URINE DRUG SCREEN, HOSP PERFORMED
Amphetamines: POSITIVE — AB
Barbiturates: NOT DETECTED
Benzodiazepines: POSITIVE — AB
Cocaine: POSITIVE — AB
Opiates: NOT DETECTED
Tetrahydrocannabinol: POSITIVE — AB

## 2021-05-01 LAB — HEMOGLOBIN A1C
Hgb A1c MFr Bld: 4.7 % — ABNORMAL LOW (ref 4.8–5.6)
Mean Plasma Glucose: 88.19 mg/dL

## 2021-05-01 LAB — LIPID PANEL
Cholesterol: 126 mg/dL (ref 0–200)
HDL: 65 mg/dL (ref >40)
LDL Cholesterol: 51 mg/dL (ref 0–99)
Total CHOL/HDL Ratio: 1.9 RATIO
Triglycerides: 52 mg/dL (ref <150)
VLDL: 10 mg/dL (ref 0–40)

## 2021-05-01 LAB — RESP PANEL BY RT-PCR (FLU A&B, COVID) ARPGX2
Influenza A by PCR: NEGATIVE
Influenza B by PCR: NEGATIVE
SARS Coronavirus 2 by RT PCR: NEGATIVE

## 2021-05-01 MED ORDER — ALUM & MAG HYDROXIDE-SIMETH 200-200-20 MG/5ML PO SUSP: 30.0000 mL | ORAL | Status: DC | PRN

## 2021-05-01 MED ORDER — HYDROXYZINE HCL 25 MG PO TABS
25.0000 mg | ORAL_TABLET | Freq: Four times a day (QID) | ORAL | Status: DC | PRN
  Administered 2021-05-01 – 2021-05-02 (×3): 25 mg via ORAL
  Filled 2021-05-01 (×3): qty 1

## 2021-05-01 MED ORDER — ADULT MULTIVITAMIN W/MINERALS CH
1.0000 | ORAL_TABLET | Freq: Every day | ORAL | Status: DC
  Administered 2021-05-01 – 2021-05-06 (×6): 1 via ORAL
  Filled 2021-05-01 (×9): qty 1

## 2021-05-01 MED ORDER — MAGNESIUM HYDROXIDE 400 MG/5ML PO SUSP: 30.0000 mL | Freq: Every day | ORAL | Status: DC | PRN

## 2021-05-01 MED ORDER — THIAMINE HCL 100 MG PO TABS
100.0000 mg | ORAL_TABLET | Freq: Every day | ORAL | Status: DC
  Administered 2021-05-01 – 2021-05-06 (×6): 100 mg via ORAL
  Filled 2021-05-01 (×10): qty 1

## 2021-05-01 MED ORDER — GABAPENTIN 100 MG PO CAPS
100.0000 mg | ORAL_CAPSULE | Freq: Three times a day (TID) | ORAL | Status: DC
  Administered 2021-05-01 – 2021-05-03 (×6): 100 mg via ORAL
  Filled 2021-05-01 (×14): qty 1

## 2021-05-01 MED ORDER — BACITRACIN ZINC 500 UNIT/GM EX OINT
TOPICAL_OINTMENT | Freq: Two times a day (BID) | CUTANEOUS | Status: DC
  Administered 2021-05-01 – 2021-05-06 (×7): 31.5556 via TOPICAL
  Filled 2021-05-01 (×2): qty 28.35

## 2021-05-01 MED ORDER — ONDANSETRON 4 MG PO TBDP
4.0000 mg | ORAL_TABLET | Freq: Four times a day (QID) | ORAL | Status: AC | PRN
Start: 2021-05-01 — End: 2021-05-04

## 2021-05-01 MED ORDER — SERTRALINE HCL 25 MG PO TABS
25.0000 mg | ORAL_TABLET | Freq: Every day | ORAL | Status: DC
  Administered 2021-05-01 – 2021-05-02 (×2): 25 mg via ORAL
  Filled 2021-05-01 (×5): qty 1

## 2021-05-01 MED ORDER — TETANUS-DIPHTH-ACELL PERTUSSIS 5-2.5-18.5 LF-MCG/0.5 IM SUSY
0.5000 mL | PREFILLED_SYRINGE | Freq: Once | INTRAMUSCULAR | Status: AC
  Administered 2021-05-02: 0.5 mL via INTRAMUSCULAR
  Filled 2021-05-01 (×3): qty 0.5

## 2021-05-01 MED ORDER — LORAZEPAM 1 MG PO TABS
1.0000 mg | ORAL_TABLET | Freq: Four times a day (QID) | ORAL | Status: AC | PRN
  Administered 2021-05-01: 1 mg via ORAL
  Filled 2021-05-01: qty 1

## 2021-05-01 MED ORDER — LOPERAMIDE HCL 2 MG PO CAPS: 2.0000 mg | ORAL_CAPSULE | ORAL | Status: AC | PRN

## 2021-05-01 MED ORDER — NICOTINE 14 MG/24HR TD PT24
14.0000 mg | MEDICATED_PATCH | Freq: Every day | TRANSDERMAL | Status: DC
  Administered 2021-05-01 – 2021-05-03 (×3): 14 mg via TRANSDERMAL
  Filled 2021-05-01 (×9): qty 1
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
## 2021-05-01 NOTE — H&P (Addendum)
Behavioral Health Medical Screening Exam ? ?Visit Diagnoses:  ? -MDD (major depressive disorder), recurrent severe, without psychosis (HCC) ? -Suicide attempt by cutting of wrist (HCC)  ? ?HPI:  ? ?Monique AtesJanet D Lyons is a 36 y.o. female with past psychiatric history significant for depression and previous suicide attempt, documented past medical history significant for tobacco dependence, C-section, and chronic pelvic pain, and reported past medical history significant for "potential cirrhosis of the liver" who [facility] unaccompanied as a voluntary walk-in ([facility]) for suicide attempt and worsening depression (see details below).  When patient is asked [facility], patient states "I tried to kill myself".  When patient is asked to provide further details regarding this statement, patient states that she attempted suicide by cutting her left wrist sometime between 3:30 AM and 4:00 AM earlier this morning on 05/01/2021.  Patient denies cutting any additional parts of her body at that time.  Patient denies engaging in any other activities as part of this suicide attempt. Patient endorses slight pain of the area of her left wrist where she cut herself earlier this morning.  Patient denies any additional pain, additional injuries, fall, head injury, loss of consciousness, chest pain, shortness of breath, lightheadedness, dizziness, vision changes, abdominal pain, nausea, vomiting, or any additional physical symptoms.  Patient endorses history of 1 additional previous suicide attempt 8 years ago via overdosing on Tylenol and cutting her wrist.  Patient denies any additional history of intentionally cutting herself.  Patient states that she is not up-to-date on her tetanus vaccinations at this time.  Patient denies history of intentionally burning herself.  Patient endorses active SI currently on exam with intent, but without plan at this  time.  She denies HI.  She denies AVH or paranoia. ? ?Patient describes her sleep as "terrible", but does not provide details regarding number of hours of sleep per night.  Patient endorses anhedonia as well as feelings of guilt, hopelessness, and worthlessness over the past few months.  She endorses decline in energy and concentration over the past few months.  Patient reports that her appetite fluctuates between being increased and decreased.  Patient also endorses fluctuating weight loss and weight gain over the past 2 to 3 months, but does not provide details regarding how much weight she has specifically lost or gained. ? ?Patient denies having a psychiatrist or therapist at this time.  Patient reports that [facility], the patient states that she is unable to recall how long it has been since she saw the psychiatrist. Patient denies taking any psychotropic medications at this time.  Patient denies taking any home medications at this time aside from as needed Advil and multivitamin.  Patient reports that she took two 500 mg Advil tablets sometime yesterday on 04/30/2021 (patient is unsure of exactly what time she took these tablets yesterday on 04/30/2021) for low back pain.  Patient denies any additional prescription medication ingestions within the last 24 hours.  Patient reports that she was psychiatrically hospitalized on 1 occasion 8 years ago for her previous suicide attempt noted above (patient reports that [facility] but does not provide further details regarding this hospitalization).  ? ?Patient reports that she is currently homeless and states that she has been homeless since 04/30/2021.  Patient reports that she was kicked out of her home on 04/30/2021, but states that  she does not want to discuss who kicked her out of the home.  Patient denies access to firearms.  Patient reports that she drinks alcohol on rare occasion,  approximately once per year.  However, patient does admit that she drank 5 shots of liquor yesterday on 04/30/2021 (patient states that this alcohol ingestion took place between 5:00 PM and sometime last night on 04/30/2021).  Patient denies history of alcohol-related withdrawal symptoms, delirium tremens, or seizures.  Patient endorses smoking 0.5 packs/day of cigarettes.  When patient is asked about illicit substance use, patient initially denies illicit substance use but then states "I did a lot of things last night that I shouldn't have. I'm so ashamed of myself right now".  When patient is asked what this statement is concerning, patient states that this statement is in regards to her using illicit substances last night on 04/30/2021, but patient states that she does not want to discuss the details of what substances she used at this time.  She states that the substances used from the evening of 04/30/2021 noted above were not used with the intention of attempting suicide.  Patient states that she is unemployed at this time.  She states that her main source of support is her best friend. ? ?On exam, patient is sitting upright with disheveled appearance, but in no acute distress.  Patient does appear to be slightly intoxicated on exam and patient smells of alcohol on exam. Patient is extremely tearful on exam.  Eye contact is good.  Speech is clear and coherent with normal rate and volume.  Mood is depressed with mood congruent, tearful affect.  Thought process is coherent, goal directed, and linear.  Patient is alert and oriented to person, time, place, and situation.  Patient is cooperative and answers all questions appropriately throughout the evaluation.  Objectively, there is no indication that patient is responding to internal/external stimuli on exam at this time.  No delusional thought content elicited on exam. ? ?Total Time spent with patient: 30 minutes ? ?Psychiatric Specialty Exam: ? ?Presentation   ?General Appearance: Appropriate for Environment; Disheveled ? ?Eye Contact:Good ? ?Speech:Clear and Coherent; Normal Rate ? ?Speech Volume:Normal ? ?Handedness:No data recorded ? ?Mood and Affect  ?Mood:Depressed ? ?Affect:Congruent; Tearful ? ? ?Thought Process  ?Thought Processes:Coherent; Goal Directed; Linear ? ?Descriptions of Associations:Intact ? ?Orientation:Full (Time, Place and Person) ? ?Thought Content:Logical; WDL ? ?History of Schizophrenia/Schizoaffective disorder:No data recorded ?Duration of Psychotic Symptoms:No data recorded ?Hallucinations:Hallucinations: None ? ?Ideas of Reference:None ? ?Suicidal Thoughts:Suicidal Thoughts: -- (Patient reports active SI currently on exam with intent, but without plan at this time. Patient reports that she attempted suicide by cutting her left wrist with a razor blade some time between 3:30 AM and 4:00 AM this morning (see HPI for details).) ? ?Homicidal Thoughts:Homicidal Thoughts: No ? ? ?Sensorium  ?Memory:Immediate Fair; Recent Fair; Remote Fair ? ?Judgment:Poor ? ?Insight:Poor; Lacking ? ? ?Executive Functions  ?Concentration:Fair ? ?Attention Span:Fair ? ?Recall:Fair ? ?Fund of Knowledge:Fair ? ?Language:Good ? ? ?Psychomotor Activity  ?Psychomotor Activity:Psychomotor Activity: Normal ? ? ?Assets  ?Assets:Communication Skills; Desire for Improvement; Leisure Time; Physical Health; Resilience; Social Support ? ? ?Sleep  ?Sleep:Sleep: Poor ? ? ? ?Physical Exam: ?Physical Exam ?Vitals reviewed.  ?Constitutional:   ?   General: She is not in acute distress. ?   Appearance: She is not ill-appearing, toxic-appearing or diaphoretic.  ?HENT:  ?   Head: Normocephalic and atraumatic.  ?   Right Ear: External ear normal.  ?  Left Ear: External ear normal.  ?   Ears:  ?   Comments: Dried blood noted near tragus of patient's left ear.  ?   Nose: Nose normal.  ?Eyes:  ?   General: No scleral icterus.    ?   Right eye: No discharge.     ?   Left eye: No discharge.  ?    Conjunctiva/sclera: Conjunctivae normal.  ?   Comments: PERRLA bilaterally.  EOMs intact bilaterally.  No nystagmus noted.  ?Cardiovascular:  ?   Comments: Heart regular rate and rhythm.  No murmurs, rub

## 2021-05-01 NOTE — Progress Notes (Signed)
Pt instructed on need for urine for specimen. A labeled cup has been provided for her and instructed to alert nurse when specimen provided.  ?

## 2021-05-01 NOTE — [facility] Counselor (Signed)
CSW provided the Pt with a packet that contains information including shelter and housing resources, free and reduced price food information, clothing resources, crisis center information, a [employer] card, and suicide prevention information.  At the Pt's request CSW also included information about how to apply for Disability Benefits. ?

## 2021-05-01 NOTE — Group Note (Signed)
LCSW Group Therapy Note ? ? ?Group Date: 05/01/2021 ?Start Time: 1300 ?End Time: 1400 ? ?Topic: Sleep ? ?Participation: Active  ? ?Due to the illness on the unit, Infectious Disease has recommended no close contact at this time, therefore group was not held. Patient was provided therapeutic worksheets and asked to meet with CSW to discuss those worksheets as needed.   ? ?Aram Beecham, LCSWA ?05/01/2021  1:16 PM   ? ?
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
## 2021-05-01 NOTE — Tx Team (Signed)
Initial Treatment Plan ?05/01/2021 ?11:19 AM ?Monique Lyons ?ENI:[id number] ? ? ? ?PATIENT STRESSORS: ?Financial difficulties   ?Health problems   ?Medication change or noncompliance   ?Occupational concerns   ?Substance abuse   ? ? ?PATIENT STRENGTHS: ?Ability for insight  ?Capable of independent living  ?Communication skills  ?Supportive family/friends  ? ? ?PATIENT IDENTIFIED PROBLEMS: ?Risk of self harm "I cut my left wrist, I still have thoughts of hurting myself".  ?  ?Alterations in mood "I'm depressed, I'm worried where to go, I have no money".  ?  ?Financial constraints  ?  ?Substance Use "I used ecstacy, marijuana and crack yesterday" 04/30/21  ?  ?  ?  ? ?DISCHARGE CRITERIA:  ?Improved stabilization in mood, thinking, and/or behavior ?Verbal commitment to aftercare and medication compliance ? ?PRELIMINARY DISCHARGE PLAN: ?Outpatient therapy ?Placement in alternative living arrangements ? ?PATIENT/FAMILY INVOLVEMENT: ?This treatment plan has been presented to and reviewed with the patient, Monique Lyons.  The patient have been given the opportunity to ask questions and make suggestions. ? ?Sherryl Manges, RN ?05/01/2021, 11:19 AM ?

## 2021-05-01 NOTE — Plan of Care (Signed)
Pt progressing
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
## 2021-05-01 NOTE — Progress Notes (Addendum)
Patient ID: Monique Lyons, female   DOB: 11-28-1985, 36 y.o.   MRN: [id number] ?Pt admitted on the unit after walking in reporting a suicide attempt by cutting her wrist. Patient appears very irritable, agitated, stating '' this is like some [place] [facility] making me wait three hours in this room and I should be on suicide watch! '' Patient visibly agitated, crying  threw tissues that were scattered across the room she was waiting in and slammed tissue box down.  Allowed patient to ventilate and support provided. Patient was then admitted via admit RN. Upon the unit she stated she was feeling very anxious, agitated ,and noted to be crying and visibly upset. Pt given medications to address. MD present for above to assess. Pt remains safe, will con't to monitor.  ?Pt completed self inventory- rates her depression at a 5000 on scale of 0-10 ten being worst. Patient reports anxiety and hopelessness at 10/10 on scale with ten being worst. Pt states her goal today is to work on her inside and try to relax. Support and encouragement have been provided. Pt educated on how to cleanse wrist wound with soap and water before bacitracin applied. Pt declined to take TDAP with writer states ''it makes my arm so sore can I take it tonight.'' No further voiced concerns at this time. Will con't to monitor. ? ?
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
## 2021-05-01 NOTE — Group Note (Signed)
Recreation Therapy Group Note ? ? ?Group Topic:Leisure Education  ?Group Date: 05/01/2021 ?Start Time: 1010 ?End Time: 1140 ?Facilitators: Masin Shatto, LRT,CTRS ?Location: [address] Dayroom ? ? ?Goal Area(s) Addresses:  ?Patient will review and complete packet supporting identification of healthy leisure and recreation activities.  ? ?Activity:  Patients were given packets to dealing with what leisure and the benefits it provides to the individual.  The packet also included leisure activities to help patients identify different types of leisure (social, creative, relaxation, etc), what activities can be done as leisure and identifying their personal views of leisure. ? ? ?Affect/Mood: Appropriate ?  ?Participation Quality: Independent ?  ? ?Clinical Observations/Individualized Feedback:  LRT provided pt a packet reviewing leisure and its impact on emotional states and personal fulfillment.  Patients were to complete packets in their rooms at their own pace due to not being able to leave rooms because of illnesses on unit.  ? ? ?Plan: Continue to engage patient in RT group sessions 2-3x/week. ? ? ?Crescentia Boutwell, LRT,CTRS ?05/01/2021 1:32 PM ?
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
## 2021-05-02 LAB — URINALYSIS, COMPLETE (UACMP) WITH MICROSCOPIC
Bilirubin Urine: NEGATIVE
Glucose, UA: NEGATIVE mg/dL
Hgb urine dipstick: NEGATIVE
Ketones, ur: NEGATIVE mg/dL
Leukocytes,Ua: NEGATIVE
Nitrite: NEGATIVE
Protein, ur: NEGATIVE mg/dL
Specific Gravity, Urine: 1.017 (ref 1.005–1.030)
pH: 7 (ref 5.0–8.0)

## 2021-05-02 MED ORDER — SERTRALINE HCL 50 MG PO TABS
50.0000 mg | ORAL_TABLET | Freq: Every day | ORAL | Status: DC
  Administered 2021-05-03 – 2021-05-04 (×2): 50 mg via ORAL
  Filled 2021-05-02 (×4): qty 1

## 2021-05-02 MED ORDER — TRAZODONE HCL 50 MG PO TABS
50.0000 mg | ORAL_TABLET | Freq: Every evening | ORAL | Status: DC | PRN
  Administered 2021-05-02: 50 mg via ORAL
  Filled 2021-05-02: qty 1

## 2021-05-02 NOTE — B[facility] Counselor (Signed)
CSW spoke with the Pt about substance use treatment options.  CSW asked the Pt about inpatient and outpatient treatment options and reviewed details of each with the Pt.  The Pt states that she is not interested in either residential or outpatient options.  She states that she only wants therapy and medication management and does not feel that she needs assistance with her alcohol use at this time..  The Pt also declined lists of NA and AA meetings.  CSW asked the Pt to inform her if she changes her mind.  CSW will continue to discuss discharge planning with the Pt.  ?

## 2021-05-02 NOTE — Progress Notes (Signed)
Patient signed the 72 hours request for discharge form at  1818 of 05-02-21. ?

## 2021-05-02 NOTE — [facility] Group Notes (Signed)
Pt goal today is to work on herself worth and try to be happy.  Nutrition handouts were given to the pat from group. ?

## 2021-05-02 NOTE — Progress Notes (Signed)
?   05/02/21 0800  ?Psych Admission Type (Psych Patients Only)  ?Admission Status Voluntary  ?Psychosocial Assessment  ?Patient Complaints None  ?Eye Contact Fair  ?Facial Expression Sad  ?Affect Depressed;Flat  ?Speech Logical/coherent  ?Interaction Assertive  ?Motor Activity Slow  ?Appearance/Hygiene In hospital gown  ?Behavior Characteristics Cooperative  ?Mood Depressed  ?Thought Process  ?Coherency Blocking  ?Content WDL  ?Delusions None reported or observed  ?Perception WDL  ?Hallucination None reported or observed  ?Judgment Poor  ?Confusion None  ?Danger to Self  ?Current suicidal ideation? Denies  ?Self-Injurious Behavior Some self-injurious ideation observed or expressed.  No lethal plan expressed   ?Agreement Not to Harm Self Yes  ?Danger to Others  ?Danger to Others None reported or observed  ? ?Patient is alert and oriented to place , person and situation during shift assessment, patient is pleasant on polite on approach  Patient denies SI/HI/AVH. Patient is compliant with routine medication, tolerated them well with no side effect noted. Patient denies pain and discomfort. Patient is seen in the milieu  with no distress noted, patient affect is bright, appearance is appropriate. No distress noted at this time,  Q 15 minutes safety observation in place. Staff will continue to support monitor patient. ? ?

## 2021-05-02 NOTE — Group Note (Signed)
Recreation Therapy Group Note ? ? ?Group Topic:Communication  ?Group Date: 05/02/2021 ?Start Time: 1030 ?End Time: 1035 ?Facilitators: Caroll Rancher, LRT,CTRS ?Location: [address] Day[address] ? ? ?Goal Area(s) Addresses:  ?Patient will effectively listen to complete activity.  ?Patient will identify communication skills used to make activity successful.  ?Patient will identify how skills used during activity can be used to reach post d/c goals.  ?  ?Group Description: Geometric Drawings.  Patients were to sit in the doorways of their rooms to complete the activity.  LRT described abstract pictures of a particular arrangement of geometrical shapes.  LRT completed the first two rounds before letting the patients do the last two.  Each round, the 'speaker' will describe the pattern, as accurately as possible without revealing the image to the group.  The remaining group members will listen and draw the picture to reflect how it is described to them. Patients with the role of 'listener' cannot ask clarifying questions but, may request that the speaker repeat a direction. Once the drawings are complete, the presenter will show the rest of the group the picture and compare how close each person came to drawing the picture. LRT will facilitate a post-activity discussion regarding effective communication and the importance of planning, listening, and asking for clarification in daily interactions with others. ? ? ? ?Affect/Mood: N/A ?  ?Participation Level: Did not attend ?  ? ?Clinical Observations/Individualized Feedback:   ? ? ?Plan: Continue to engage patient in RT group sessions 2-3x/week. ? ? ?Caroll Rancher, LRT,CTRS ?05/02/2021 12:57 PM ?

## 2021-05-02 NOTE — Progress Notes (Addendum)
[facility] MD Progress Note ? ?05/02/2021 3:15 PM ?Monique Lyons  ?MRN:  [id number] ? ?Subjective:  Monique Lyons reports: "I'm sleepy, but I am okay." ? ?Today's assessment (05/02/2021): Pt's chart reviewed, case discussed with her treatment team. Pt with flat affect and depressed mood, attention to personal hygiene and grooming is fair, eye contact is good, speech is clear & coherent. Thought contents are organized and logical, and pt currently denies SI/HI/AVH or paranoia. There is no evidence of delusional thoughts.   ? ?Pt reports a good sleep quality last night, reports a good appetite, denies being in any physical distress/pain, and denies any medication related side effects. Wound to left wrist is open to area, area is clean, dry, intact with no edema/drainage. Bacitracin ointment being applied to area BID. Will continue current medications as listed below.  ? ?Labs reviewed: CMP, CBC, lipid profile, Hemoglobin A1C, & TSH all WNL. Urine pregnancy test negative. Baseline EKG & UA ordered.  ? ?HPI: Monique Lyons. Monique Lyons is a 36 y.o Caucasian female who walked into the [facility] with complaints of worsening depression and suicide attempt via cutting self on left wrist. Pt was admitted voluntarily for treatment and stabilization of her mood. ? ?Principal Problem: MDD (major depressive disorder), recurrent severe, without psychosis (HCC) ?Diagnosis: Principal Problem: ?  MDD (major depressive disorder), recurrent severe, without psychosis (HCC) ?Active Problems: ?  GAD (generalized anxiety disorder) ?  Alcohol use ? ?Total Time spent with patient: 20 minutes ? ?Past Psychiatric History: As above ? ?Past Medical History:  ?Past Medical History:  ?Diagnosis Date  ? Bilateral posterior subcapsular polar cataract   ? Fibromyalgia   ? Hypertension   ? notified of elevated BP in Jluly 2018  ? No pertinent past medical history   ? Ovarian cyst   ?  ?Past Surgical History:  ?Procedure Laterality Date  ? CESAREAN SECTION    ? first was  c-section; second vbac  ? CESAREAN SECTION  December 28, 2003  ? TUBAL LIGATION Bilateral 10/17/2012  ? Procedure: POST PARTUM TUBAL LIGATION bilateral;  Surgeon: Lazaro Arms, MD;  Location: [facility];  Service: Gynecology;  Laterality: Bilateral;  ? ?Family History:  ?Family History  ?Problem Relation Age of Onset  ? Endometriosis Mother   ? Heart murmur Mother   ? ?Family Psychiatric  History: None reported ?Social History:  ?Social History  ? ?Substance and Sexual Activity  ?Alcohol Use Yes  ? Alcohol/week: 3.0 standard drinks  ? Types: 2 Cans of beer, 1 Standard drinks or equivalent per week  ? Comment: occas  ?   ?Social History  ? ?Substance and Sexual Activity  ?Drug Use Yes  ? Types: "Crack" cocaine, Marijuana, MDMA (Ecstacy)  ? Comment: "Burgess Estelle was my first time, I just wanted to die" 04/30/21  ?  ?Social History  ? ?Socioeconomic History  ? Marital status: Single  ?  Spouse name: Not on file  ? Number of children: Not on file  ? Years of education: Not on file  ? Highest education level: Not on file  ?Occupational History  ? Not on file  ?Tobacco Use  ? Smoking status: Every Day  ?  Packs/day: 0.50  ?  Types: Cigarettes  ? Smokeless tobacco: Never  ?Vaping Use  ? Vaping Use: Never used  ?Substance and Sexual Activity  ? Alcohol use: Yes  ?  Alcohol/week: 3.0 standard drinks  ?  Types: 2 Cans of beer, 1 Standard drinks or equivalent per week  ?  Comment: occas  ? Drug use: Yes  ?  Types: "Crack" cocaine, Marijuana, MDMA (Ecstacy)  ?  Comment: "Burgess EstelleYesterday was my first time, I just wanted to die" 04/30/21  ? Sexual activity: Yes  ?  Birth control/protection: Surgical  ?Other Topics Concern  ? Not on file  ?Social History Narrative  ? Not on file  ? ?Social Determinants of Health  ? ?Financial Resource Strain: Not on file  ?Food Insecurity: Not on file  ?Transportation Needs: Not on file  ?Physical Activity: Not on file  ?Stress: Not on file  ?Social Connections: Not on file  ?  ?Sleep: Good ? ?Appetite:   Good ? ?Current Medications: ?Current Facility-Administered Medications  ?Medication Dose Route Frequency Provider Last Rate Last Admin  ? alum & mag hydroxide-simeth (MAALOX/MYLANTA) 200-200-20 MG/5ML suspension 30 mL  30 mL Oral Q4H PRN Melbourne Abtsaylor, Cody W, PA-C      ? bacitracin ointment   Topical BID Comer LocketSingleton, Amy E, MD   [phone] application. at 05/01/21 1627  ? gabapentin (NEURONTIN) capsule 100 mg  100 mg Oral TID Karsten Rooda, Vandana, MD   100 mg at 05/02/21 1334  ? hydrOXYzine (ATARAX) tablet 25 mg  25 mg Oral Q6H PRN Jaclyn Shaggyaylor, Cody W, PA-C   25 mg at 05/01/21 2113  ? loperamide (IMODIUM) capsule 2-4 mg  2-4 mg Oral PRN Jaclyn Shaggyaylor, Cody W, PA-C      ? LORazepam (ATIVAN) tablet 1 mg  1 mg Oral Q6H PRN Jaclyn Shaggyaylor, Cody W, PA-C   1 mg at 05/01/21 [id number]  ? magnesium hydroxide (MILK OF MAGNESIA) suspension 30 mL  30 mL Oral Daily PRN Jaclyn Shaggyaylor, Cody W, PA-C      ? multivitamin with minerals tablet 1 tablet  1 tablet Oral Daily Jaclyn Shaggyaylor, Cody W, PA-C   1 tablet at 05/02/21 [id number]  ? nicotine (NICODERM CQ - dosed in mg/24 hours) patch 14 mg  14 mg Transdermal Daily Jaclyn Shaggyaylor, Cody W, PA-C   14 mg at 05/02/21 0754  ? ondansetron (ZOFRAN-ODT) disintegrating tablet 4 mg  4 mg Oral Q6H PRN Jaclyn Shaggyaylor, Cody W, PA-C      ? [START ON 05/03/2021] sertraline (ZOLOFT) tablet 50 mg  50 mg Oral Daily Dimarco Minkin, NP      ? thiamine tablet 100 mg  100 mg Oral Daily Melbourne Abtsaylor, Cody W, PA-C   100 mg at 05/02/21 0754  ? traZODone (DESYREL) tablet 50 mg  50 mg Oral QHS PRN Phineas InchesMassengill, Nathan, MD      ? ?Lab Results:  ?Results for orders placed or performed during the hospital encounter of 05/01/21 (from the past 48 hour(s))  ?Resp Panel by RT-PCR (Flu A&B, Covid) Nasopharyngeal Swab     Status: None  ? Collection Time: 05/01/21  6:09 AM  ? Specimen: Nasopharyngeal Swab; Nasopharyngeal(NP) swabs in vial transport medium  ?Result Value Ref Range  ? SARS Coronavirus 2 by RT PCR NEGATIVE NEGATIVE  ?  Comment: (NOTE) ?SARS-CoV-2 target nucleic acids are NOT  DETECTED. ? ?The SARS-CoV-2 RNA is generally detectable in upper respiratory ?specimens during the acute phase of infection. The lowest ?concentration of SARS-CoV-2 viral copies this assay can detect is ?138 copies/mL. A negative result does not preclude SARS-Cov-2 ?infection and should not be used as the sole basis for treatment or ?other patient management decisions. A negative result may occur with  ?improper specimen collection/handling, submission of specimen other ?than nasopharyngeal swab, presence of viral mutation(s) within the ?areas targeted by this assay, and inadequate number of viral ?  copies(<138 copies/mL). A negative result must be combined with ?clinical observations, patient history, and epidemiological ?information. The expected result is Negative. ? ?Fact Sheet for Patients:  ?BloggerCourse.com ? ?Fact Sheet for Healthcare Providers:  ?SeriousBroker.it ? ?This test is no t yet approved or cleared by the [place] FDA and  ?has been authorized for detection and/or diagnosis of SARS-CoV-2 by ?FDA under an Emergency Use Authorization (EUA). This EUA will remain  ?in effect (meaning this test can be used) for the duration of the ?COVID-19 declaration under Section 564(b)(1) of the Act, 21 ?U.S.C.section 360bbb-3(b)(1), unless the authorization is terminated  ?or revoked sooner.  ? ? ?  ? Influenza A by PCR NEGATIVE NEGATIVE  ? Influenza B by PCR NEGATIVE NEGATIVE  ?  Comment: (NOTE) ?The Xpert Xpress SARS-CoV-2/FLU/RSV plus assay is intended as an aid ?in the diagnosis of influenza from Nasopharyngeal swab specimens and ?should not be used as a sole basis for treatment. Nasal washings and ?aspirates are unacceptable for Xpert Xpress SARS-CoV-2/FLU/RSV ?testing. ? ?Fact Sheet for Patients: ?BloggerCourse.com ? ?Fact Sheet for Healthcare Providers: ?SeriousBroker.it ? ?This test is not yet approved or  cleared by the [place] FDA and ?has been authorized for detection and/or diagnosis of SARS-CoV-2 by ?FDA under an Emergency Use Authorization (EUA). This EUA will remain ?in effect (meaning this test can be used) for the d

## 2021-05-02 NOTE — Plan of Care (Signed)
?  Problem: Coping: ?Goal: Coping ability will improve ?Outcome: Progressing ?Goal: Will verbalize feelings ?Outcome: Progressing ?  ?Problem: Safety: ?Goal: Ability to disclose and discuss suicidal ideas will improve ?Outcome: Progressing ?Goal: Ability to identify and utilize support systems that promote safety will improve ?Outcome: Progressing ?  ?Problem: Role Relationship: ?Goal: Will demonstrate positive changes in social behaviors and relationships ?Outcome: Progressing ?  ?

## 2021-05-02 NOTE — Progress Notes (Signed)
? ? ?   05/02/21 [id number]  ?Vital Signs  ?Pulse Rate (!) 137  ?Pulse Rate Source Monitor  ?BP (!) 114/94  ?BP Location Right Arm  ?BP Method Automatic  ?Patient Position (if appropriate) Standing  ?Oxygen Therapy  ?SpO2 99 %  ? ? ?Pt tremulous.  Manual HR obtained by writer = HR 74 lying. ?

## 2021-05-02 NOTE — Progress Notes (Signed)
? ? ?   05/01/21 2113  ?Psych Admission Type (Psych Patients Only)  ?Admission Status Voluntary  ?Psychosocial Assessment  ?Patient Complaints Anxiety;Depression  ?Eye Contact Fair  ?Facial Expression Worried;Sad  ?Affect Depressed  ?Speech Logical/coherent  ?Interaction Assertive  ?Motor Activity Fidgety  ?Appearance/Hygiene In hospital gown  ?Behavior Characteristics Cooperative  ?Mood Depressed;Anxious  ?Thought Process  ?Coherency Circumstantial  ?Content WDL  ?Delusions None reported or observed  ?Perception WDL  ?Hallucination None reported or observed  ?Judgment Poor  ?Confusion None  ?Danger to Self  ?Current suicidal ideation? Denies  ?Self-Injurious Behavior Some self-injurious ideation observed or expressed.  No lethal plan expressed   ?Agreement Not to Harm Self Yes  ?Description of Agreement Verbal contract for safety  ?Danger to Others  ?Danger to Others None reported or observed  ? ? ?

## 2021-05-03 ENCOUNTER — Encounter (HOSPITAL_COMMUNITY): Payer: Self-pay | Admitting: Student

## 2021-05-03 ENCOUNTER — Encounter (HOSPITAL_COMMUNITY): Payer: Self-pay

## 2021-05-03 MED ORDER — PHENYLEPHRINE-MINERAL OIL-PET 0.25-14-74.9 % RE OINT
1.0000 "application " | TOPICAL_OINTMENT | Freq: Two times a day (BID) | RECTAL | Status: DC | PRN
  Filled 2021-05-03: qty 57

## 2021-05-03 MED ORDER — GABAPENTIN 300 MG PO CAPS: 300.0000 mg | ORAL_CAPSULE | Freq: Three times a day (TID) | ORAL | Status: DC

## 2021-05-03 MED ORDER — HYDROXYZINE HCL 50 MG PO TABS
50.0000 mg | ORAL_TABLET | Freq: Every day | ORAL | Status: DC
  Administered 2021-05-03: 50 mg via ORAL
  Filled 2021-05-03 (×2): qty 1

## 2021-05-03 MED ORDER — WHITE PETROLATUM EX OINT
TOPICAL_OINTMENT | CUTANEOUS | Status: AC
  Filled 2021-05-03: qty 10

## 2021-05-03 MED ORDER — GABAPENTIN 100 MG PO CAPS
200.0000 mg | ORAL_CAPSULE | Freq: Three times a day (TID) | ORAL | Status: DC
  Administered 2021-05-03 – 2021-05-04 (×4): 200 mg via ORAL
  Filled 2021-05-03 (×9): qty 2

## 2021-05-03 NOTE — Progress Notes (Signed)
?   05/03/21 0400  ?Psych Admission Type (Psych Patients Only)  ?Admission Status Voluntary  ?Psychosocial Assessment  ?Patient Complaints Anxiety  ?Eye Contact Fair  ?Facial Expression Flat  ?Affect Depressed  ?Speech Logical/coherent  ?Interaction Assertive  ?Motor Activity Slow  ?Appearance/Hygiene Improved  ?Behavior Characteristics Cooperative;Appropriate to situation  ?Mood Depressed  ?Thought Process  ?Coherency WDL  ?Content WDL  ?Delusions None reported or observed  ?Perception WDL  ?Hallucination None reported or observed  ?Judgment Poor  ?Confusion None  ?Danger to Self  ?Current suicidal ideation? Denies  ?Self-Injurious Behavior No self-injurious ideation or behavior indicators observed or expressed   ?Agreement Not to Harm Self Yes  ?Description of Agreement Verbal  ?Danger to Others  ?Danger to Others None reported or observed  ? ? ?

## 2021-05-03 NOTE — Progress Notes (Addendum)
[facility] MD Progress Note ? ?05/03/2021 12:44 PM ?[name]  ?MRN:  [id number] ? ?Subjective:  Monique Lyons reports: "I feel like I was at the wrong place at the wrong time. It was a one time thing. I do not want to go to rehab any more." ? ?Today's assessment (05/03/2021): For this encounter, pt is seen in her room by attending Psychiatrist and this Probation officer. Pt's chart reviewed, case discussed with her treatment team. Pt with flat affect and depressed mood, attention to personal hygiene and grooming is fair, eye contact is good, speech is clear & coherent. Thought contents are organized and logical, and pt currently denies SI/HI/AVH or paranoia. There is no evidence of delusional thoughts.   ? ?Pt reports not wanting to go to rehab any more, states that she was at the wrong place at the wrong time, and it was a one time occurrence when she was drinking. Pt reports that she slept well last night on the Trazodone, but states that Trazodone causes day time drowsiness as well. She reports feeling sleepy during the day because of this. Attending Psychiatrist discussed discontinuing the Trazodone and scheduling Hydroxyzine at night for insomnia since it is less potent, and  pt agreeable to this. Pt reports trying a few different things for anxiety in the past, but is unable to remember what worked best for her in the past. Pt reports that her depressive symptoms as well as anxiety are still problematic for her. Pt educated that the Zoloft 50 mg is for her depressive symptoms. Neurontin increased from 100 mg to 200 mg TID for management of her anxiety. She reports a good appetite. Left wrist scab is scabbing over, area is clean/dry/intact with no edema/drainage noted.  Will implement the changes above as well as continue the other medications listed below. ? ?Labs reviewed: CMP, CBC, lipid profile, Hemoglobin A1C, & TSH all WNL. Urine pregnancy test negative. Baseline EKG WNL, with QTC of 430. UA ordered, and with rare bacteria,  but negative for leukocytes. Pt is asymptomatic.  ? ?HPI: Monique Lyons is a 36 y.o Caucasian female who walked into the [facility] with complaints of worsening depression and suicide attempt via cutting self on left wrist. Pt was admitted voluntarily for treatment and stabilization of her mood. ? ?Principal Problem: MDD (major depressive disorder), recurrent severe, without psychosis ([place]) ?Diagnosis: Principal Problem: ?  MDD (major depressive disorder), recurrent severe, without psychosis ([place]) ?Active Problems: ?  GAD (generalized anxiety disorder) ?  Alcohol use ? ?Total Time spent with patient: 20 minutes ? ?Past Psychiatric History: As above ? ?Past Medical History:  ?Past Medical History:  ?Diagnosis Date  ? Bilateral posterior subcapsular polar cataract   ? Fibromyalgia   ? Hypertension   ? notified of elevated BP in Jluly 2018  ? No pertinent past medical history   ? Ovarian cyst   ?  ?Past Surgical History:  ?Procedure Laterality Date  ? CESAREAN SECTION    ? first was c-section; second vbac  ? CESAREAN SECTION  December 28, 2003  ? TUBAL LIGATION Bilateral 10/17/2012  ? Procedure: POST PARTUM TUBAL LIGATION bilateral;  Surgeon: Florian Buff, MD;  Location: [facility];  Service: Gynecology;  Laterality: Bilateral;  ? ?Family History:  ?Family History  ?Problem Relation Age of Onset  ? Endometriosis Mother   ? Heart murmur Mother   ? ?Family Psychiatric  History: None reported ?Social History:  ?Social History  ? ?Substance and Sexual Activity  ?Alcohol Use  Yes  ? Alcohol/week: 3.0 standard drinks  ? Types: 2 Cans of beer, 1 Standard drinks or equivalent per week  ? Comment: occas  ?   ?Social History  ? ?Substance and Sexual Activity  ?Drug Use Yes  ? Types: "Crack" cocaine, Marijuana, MDMA (Ecstacy)  ? Comment: "Wilburn Mylar was my first time, I just wanted to die" 04/30/21  ?  ?Social History  ? ?Socioeconomic History  ? Marital status: Single  ?  Spouse name: Not on file  ? Number of children: Not on file  ?  Years of education: Not on file  ? Highest education level: Not on file  ?Occupational History  ? Not on file  ?Tobacco Use  ? Smoking status: Every Day  ?  Packs/day: 0.50  ?  Types: Cigarettes  ? Smokeless tobacco: Never  ?Vaping Use  ? Vaping Use: Never used  ?Substance and Sexual Activity  ? Alcohol use: Yes  ?  Alcohol/week: 3.0 standard drinks  ?  Types: 2 Cans of beer, 1 Standard drinks or equivalent per week  ?  Comment: occas  ? Drug use: Yes  ?  Types: "Crack" cocaine, Marijuana, MDMA (Ecstacy)  ?  Comment: "Wilburn Mylar was my first time, I just wanted to die" 04/30/21  ? Sexual activity: Yes  ?  Birth control/protection: Surgical  ?Other Topics Concern  ? Not on file  ?Social History Narrative  ? Not on file  ? ?Social Determinants of Health  ? ?Financial Resource Strain: Not on file  ?Food Insecurity: Not on file  ?Transportation Needs: Not on file  ?Physical Activity: Not on file  ?Stress: Not on file  ?Social Connections: Not on file  ?  ?Sleep: Good ? ?Appetite:  Good ? ?Current Medications: ?Current Facility-Administered Medications  ?Medication Dose Route Frequency Provider Last Rate Last Admin  ? alum & mag hydroxide-simeth (MAALOX/MYLANTA) 200-200-20 MG/5ML suspension 30 mL  30 mL Oral Q4H PRN Margorie John W, PA-C      ? bacitracin ointment   Topical BID Harlow Asa, MD   [id number] application. at 05/02/21 1718  ? gabapentin (NEURONTIN) capsule 200 mg  200 mg Oral TID Nicholes Rough, NP   200 mg at 05/03/21 1210  ? hydrOXYzine (ATARAX) tablet 50 mg  50 mg Oral QHS Nkwenti, Doris, NP      ? loperamide (IMODIUM) capsule 2-4 mg  2-4 mg Oral PRN Prescilla Sours, PA-C      ? LORazepam (ATIVAN) tablet 1 mg  1 mg Oral Q6H PRN Prescilla Sours, PA-C   1 mg at 05/01/21 [id number]  ? magnesium hydroxide (MILK OF MAGNESIA) suspension 30 mL  30 mL Oral Daily PRN Prescilla Sours, PA-C      ? multivitamin with minerals tablet 1 tablet  1 tablet Oral Daily Prescilla Sours, PA-C   1 tablet at 05/03/21 [id number]  ? nicotine  (NICODERM CQ - dosed in mg/24 hours) patch 14 mg  14 mg Transdermal Daily Prescilla Sours, PA-C   14 mg at 05/03/21 [id number]  ? ondansetron (ZOFRAN-ODT) disintegrating tablet 4 mg  4 mg Oral Q6H PRN Prescilla Sours, PA-C      ? phenylephrine-shark liver oil-mineral oil-petrolatum (PREPARATION H) rectal ointment 1 application.  1 application. Rectal BID PRN Terrell Ostrand, Ovid Curd, MD      ? sertraline (ZOLOFT) tablet 50 mg  50 mg Oral Daily Nicholes Rough, NP   50 mg at 05/03/21 0817  ? thiamine tablet 100 mg  100 mg  Oral Daily Prescilla Sours, PA-C   100 mg at 05/03/21 [id number]  ? white petrolatum (VASELINE) gel           ? ?Lab Results:  ?Results for orders placed or performed during the hospital encounter of 05/01/21 (from the past 48 hour(s))  ?CBC     Status: None  ? Collection Time: 05/01/21  6:46 PM  ?Result Value Ref Range  ? WBC 7.2 4.0 - 10.5 K/uL  ? RBC 4.07 3.87 - 5.11 MIL/uL  ? Hemoglobin 13.2 12.0 - 15.0 g/dL  ? HCT 39.7 36.0 - 46.0 %  ? MCV 97.5 80.0 - 100.0 fL  ? MCH 32.4 26.0 - 34.0 pg  ? MCHC 33.2 30.0 - 36.0 g/dL  ? RDW 12.5 11.5 - 15.5 %  ? Platelets 262 150 - 400 K/uL  ? nRBC 0.0 0.0 - 0.2 %  ?  Comment: Performed at [facility], [place] [address] [place], [place] [address]  ?Comprehensive metabolic panel     Status: Abnormal  ? Collection Time: 05/01/21  6:46 PM  ?Result Value Ref Range  ? Sodium 138 135 - 145 mmol/L  ? Potassium 4.3 3.5 - 5.1 mmol/L  ? Chloride 106 98 - 111 mmol/L  ? CO2 26 22 - 32 mmol/L  ? Glucose, Bld 107 (H) 70 - 99 mg/dL  ?  Comment: Glucose reference range applies only to samples taken after fasting for at least 8 hours.  ? BUN 12 6 - 20 mg/dL  ? Creatinine, Ser 0.75 0.44 - 1.00 mg/dL  ? Calcium 9.0 8.9 - 10.3 mg/dL  ? Total Protein 7.0 6.5 - 8.1 g/dL  ? Albumin 4.0 3.5 - 5.0 g/dL  ? AST 20 15 - 41 U/L  ? ALT 22 0 - 44 U/L  ? Alkaline Phosphatase 62 38 - 126 U/L  ? Total Bilirubin 0.4 0.3 - 1.2 mg/dL  ? GFR, Estimated >60 >60 mL/min  ?  Comment: (NOTE) ?Calculated  using the CKD-EPI Creatinine Equation (2021) ?  ? Anion gap 6 5 - 15  ?  Comment: Performed at [facility], [place] [address] [place], [place] [address]  ?Hemoglobin A1c     Status: Abnormal  ?

## 2021-05-03 NOTE — Progress Notes (Signed)
?   05/03/21 1100  ?Psych Admission Type (Psych Patients Only)  ?Admission Status Voluntary  ?Psychosocial Assessment  ?Patient Complaints Anxiety  ?Eye Contact Fair  ?Facial Expression Flat  ?Affect Depressed  ?Speech Logical/coherent  ?Interaction Assertive  ?Motor Activity Slow  ?Appearance/Hygiene Improved  ?Behavior Characteristics Cooperative  ?Mood Depressed  ?Thought Process  ?Coherency WDL  ?Content WDL  ?Delusions None reported or observed  ?Perception WDL  ?Hallucination None reported or observed  ?Judgment Poor  ?Confusion None  ?Danger to Self  ?Current suicidal ideation? Denies  ?Danger to Others  ?Danger to Others None reported or observed  ? ? ?

## 2021-05-03 NOTE — Progress Notes (Signed)
Monique Lyons was pleasant and cooperative.  She denied SI/HI or AVH.  She denied any pain or discomfort and appeared to be in no physical distress.  She reported that she feels like she is ready for discharge because she was "drinking and stupid."  She also reported being bored having to stay in her room which is making her restless.  She talked about making sure that she takes care of herself and is planning on talking with her friend about finding other options for child care.  Monique Lyons has been taking care of her friends children and "I need a break and I don't get money for watching them.  I know I'm just too nice."  She is currently resting with her eyes closed and appears to be asleep.  Q 15 minute checks maintained for safety.  She remains safe on the unit.   ? ? 05/03/21 2000  ?Psych Admission Type (Psych Patients Only)  ?Admission Status Voluntary  ?Psychosocial Assessment  ?Patient Complaints Other (Comment) ?("tired")  ?Eye Contact Fair  ?Facial Expression Flat  ?Affect Depressed  ?Speech Logical/coherent  ?Interaction Assertive  ?Motor Activity Other (Comment) ?(unremarkable)  ?Appearance/Hygiene Unremarkable  ?Behavior Characteristics Cooperative;Appropriate to situation  ?Mood Depressed  ?Thought Process  ?Coherency WDL  ?Content WDL  ?Delusions None reported or observed  ?Perception WDL  ?Hallucination None reported or observed  ?Judgment Poor  ?Confusion None  ?Danger to Self  ?Current suicidal ideation? Denies  ?Danger to Others  ?Danger to Others None reported or observed  ? ? ?

## 2021-05-03 NOTE — Group Note (Signed)
LCSW Group Therapy Note ? ? ?Group Date: 05/03/2021 ?Start Time: 1300 ?End Time: 1400 ? ?Topic: Problem Solving  ?  ?Participation: Active  ?  ?Due to the illness on the unit, Infectious Disease has recommended no close contact at this time, therefore group was not held. Patient was provided therapeutic worksheets and asked to meet with CSW to discuss those worksheets as needed.  ? ?Aram Beecham, LCSWA ?05/03/2021  1:17 PM   ? ?

## 2021-05-03 NOTE — BH IP Treatment Plan (Signed)
Interdisciplinary Treatment and Diagnostic Plan Update ? ?05/03/2021 ?Time of Session: 9:30am ?Caralee AtesJanet D Hinnenkamp ?MRN: [id number] ? ?Principal Diagnosis: MDD (major depressive disorder), recurrent severe, without psychosis (HCC) ? ?Secondary Diagnoses: Principal Problem: ?  MDD (major depressive disorder), recurrent severe, without psychosis (HCC) ?Active Problems: ?  GAD (generalized anxiety disorder) ?  Alcohol use ? ? ?Current Medications:  ?Current Facility-Administered Medications  ?Medication Dose Route Frequency Provider Last Rate Last Admin  ? alum & mag hydroxide-simeth (MAALOX/MYLANTA) 200-200-20 MG/5ML suspension 30 mL  30 mL Oral Q4H PRN Melbourne Abtsaylor, Cody W, PA-C      ? bacitracin ointment   Topical BID Comer LocketSingleton, Amy E, MD   [phone] application. at 05/02/21 1718  ? gabapentin (NEURONTIN) capsule 100 mg  100 mg Oral TID Karsten Rooda, Vandana, MD   100 mg at 05/03/21 [id number]  ? hydrOXYzine (ATARAX) tablet 25 mg  25 mg Oral Q6H PRN Jaclyn Shaggyaylor, Cody W, PA-C   25 mg at 05/02/21 2107  ? loperamide (IMODIUM) capsule 2-4 mg  2-4 mg Oral PRN Jaclyn Shaggyaylor, Cody W, PA-C      ? LORazepam (ATIVAN) tablet 1 mg  1 mg Oral Q6H PRN Jaclyn Shaggyaylor, Cody W, PA-C   1 mg at 05/01/21 [id number]  ? magnesium hydroxide (MILK OF MAGNESIA) suspension 30 mL  30 mL Oral Daily PRN Jaclyn Shaggyaylor, Cody W, PA-C      ? multivitamin with minerals tablet 1 tablet  1 tablet Oral Daily Jaclyn Shaggyaylor, Cody W, PA-C   1 tablet at 05/03/21 [id number]  ? nicotine (NICODERM CQ - dosed in mg/24 hours) patch 14 mg  14 mg Transdermal Daily Jaclyn Shaggyaylor, Cody W, PA-C   14 mg at 05/03/21 [id number]  ? ondansetron (ZOFRAN-ODT) disintegrating tablet 4 mg  4 mg Oral Q6H PRN Jaclyn Shaggyaylor, Cody W, PA-C      ? sertraline (ZOLOFT) tablet 50 mg  50 mg Oral Daily Starleen BlueNkwenti, Doris, NP   50 mg at 05/03/21 0817  ? thiamine tablet 100 mg  100 mg Oral Daily Melbourne Abtsaylor, Cody W, PA-C   100 mg at 05/03/21 [id number]  ? traZODone (DESYREL) tablet 50 mg  50 mg Oral QHS PRN Phineas InchesMassengill, Nathan, MD   50 mg at 05/02/21 2107  ? white petrolatum (VASELINE) gel            ? ?PTA Medications: ?Medications Prior to Admission  ?Medication Sig Dispense Refill Last Dose  ? ASHWAGANDHA PO Take 1 capsule by mouth daily.   Past Month  ? naproxen sodium (ALEVE) 220 MG tablet Take 220 mg by mouth 2 (two) times daily as needed.   Past Month  ? ? ?Patient Stressors: Financial difficulties   ?Health problems   ?Medication change or noncompliance   ?Occupational concerns   ?Substance abuse   ? ?Patient Strengths: Ability for insight  ?Capable of independent living  ?Communication skills  ?Supportive family/friends  ? ?Treatment Modalities: Medication Management, Group therapy, Case management,  ?1 to 1 session with clinician, Psychoeducation, Recreational therapy. ? ? ?Physician Treatment Plan for Primary Diagnosis: MDD (major depressive disorder), recurrent severe, without psychosis (HCC) ?Long Term Goal(s): Improvement in symptoms so as ready for discharge  ? ?Short Term Goals: Ability to identify changes in lifestyle to reduce recurrence of condition will improve ?Ability to verbalize feelings will improve ?Ability to disclose and discuss suicidal ideas ?Ability to demonstrate self-control will improve ?Ability to identify and develop effective coping behaviors will improve ?Ability to maintain clinical measurements within normal limits will improve ?Compliance with prescribed medications will improve ?Ability  to identify triggers associated with substance abuse/mental health issues will improve ? ?Medication Management: Evaluate patient's response, side effects, and tolerance of medication regimen. ? ?Therapeutic Interventions: 1 to 1 sessions, Unit Group sessions and Medication administration. ? ?Evaluation of Outcomes: Progressing ? ?Physician Treatment Plan for Secondary Diagnosis: Principal Problem: ?  MDD (major depressive disorder), recurrent severe, without psychosis (HCC) ?Active Problems: ?  GAD (generalized anxiety disorder) ?  Alcohol use ? ?Long Term Goal(s): Improvement in  symptoms so as ready for discharge  ? ?Short Term Goals: Ability to identify changes in lifestyle to reduce recurrence of condition will improve ?Ability to verbalize feelings will improve ?Ability to disclose and discuss suicidal ideas ?Ability to demonstrate self-control will improve ?Ability to identify and develop effective coping behaviors will improve ?Ability to maintain clinical measurements within normal limits will improve ?Compliance with prescribed medications will improve ?Ability to identify triggers associated with substance abuse/mental health issues will improve    ? ?Medication Management: Evaluate patient's response, side effects, and tolerance of medication regimen. ? ?Therapeutic Interventions: 1 to 1 sessions, Unit Group sessions and Medication administration. ? ?Evaluation of Outcomes: Progressing, due to sickness on the unit patients are not meeting in group however they are meeting individually with care time and participating in discussion and activities.  ? ? ?RN Treatment Plan for Primary Diagnosis: MDD (major depressive disorder), recurrent severe, without psychosis (HCC) ?Long Term Goal(s): Knowledge of disease and therapeutic regimen to maintain health will improve ? ?Short Term Goals: Ability to remain free from injury will improve, Ability to verbalize frustration and anger appropriately will improve, Ability to demonstrate self-control, Ability to participate in decision making will improve, Ability to verbalize feelings will improve, Ability to disclose and discuss suicidal ideas, Ability to identify and develop effective coping behaviors will improve, and Compliance with prescribed medications will improve ? ?Medication Management: RN will administer medications as ordered by provider, will assess and evaluate patient's response and provide education to patient for prescribed medication. RN will report any adverse and/or side effects to prescribing provider. ? ?Therapeutic  Interventions: 1 on 1 counseling sessions, Psychoeducation, Medication administration, Evaluate responses to treatment, Monitor vital signs and CBGs as ordered, Perform/monitor CIWA, COWS, AIMS and Fall Risk screenings as ordered, Perform wound care treatments as ordered. ? ?Evaluation of Outcomes: Progressing ? ? ?LCSW Treatment Plan for Primary Diagnosis: MDD (major depressive disorder), recurrent severe, without psychosis (HCC) ?Long Term Goal(s): Safe transition to appropriate next level of care at discharge, Engage patient in therapeutic group addressing interpersonal concerns. ? ?Short Term Goals: Engage patient in aftercare planning with referrals and resources, Increase social support, Increase ability to appropriately verbalize feelings, Increase emotional regulation, Facilitate acceptance of mental health diagnosis and concerns, Facilitate patient progression through stages of change regarding substance use diagnoses and concerns, Identify triggers associated with mental health/substance abuse issues, and Increase skills for wellness and recovery ? ?Therapeutic Interventions: Assess for all discharge needs, 1 to 1 time with Child psychotherapist, Explore available resources and support systems, Assess for adequacy in community support network, Educate family and significant other(s) on suicide prevention, Complete Psychosocial Assessment, Interpersonal group therapy. ? ?Evaluation of Outcomes: Progressing ? ? ?Progress in Treatment: ?Attending groups: No. Sickness on unit, meeting individually with staff members ?Participating in groups: No. Sickness on unit, meeting individually with staff members ?Taking medication as prescribed: Yes. ?Toleration medication: Yes. ?Family/Significant other contact made: Yes, individual(s) contacted:  [place] ?Patient understands diagnosis: Yes. ?Discussing patient identified problems/goals with staff:  Yes. ?Medical problems stabilized or resolved: Yes. ?Denies  suicidal/homicidal ideation: Yes. ?Issues/concerns per patient self-inventory: No. ?Other: none reported ? ?New problem(s) identified: No, Describe:  none reported ? ?New Short Term/Long Term Goal(s):  ? ?medication stab

## 2021-05-04 MED ORDER — GABAPENTIN 100 MG PO CAPS
200.0000 mg | ORAL_CAPSULE | Freq: Three times a day (TID) | ORAL | Status: DC | PRN
  Administered 2021-05-04 – 2021-05-05 (×2): 200 mg via ORAL
  Filled 2021-05-04: qty 42
  Filled 2021-05-04: qty 2

## 2021-05-04 MED ORDER — SERTRALINE HCL 100 MG PO TABS
100.0000 mg | ORAL_TABLET | Freq: Every day | ORAL | Status: DC
  Administered 2021-05-05 – 2021-05-06 (×2): 100 mg via ORAL
  Filled 2021-05-04 (×2): qty 1
  Filled 2021-05-04 (×2): qty 7
  Filled 2021-05-04: qty 1

## 2021-05-04 NOTE — Progress Notes (Addendum)
Pt is A&OX4, calm, denies suicidal ideations, homicidal ideations, auditory hallucinations and visual hallucinations. Pt reports, "It was a stupid thing. It won't ever happen again. I promise you that!" Pt denies experiencing nightmares. Mood and affect are congruent. Pt appetite is ok. No complaints of distress, pain and/or discomfort at this time. Pt's memory appears to be grossly intact, and Pt hasn?t displayed any injurious behaviors. Pt is medication compliant. There?s no evidence of suicidal intent. Psychomotor activity was WNL. No s/s of Parkinson, Dystonia, Akathisia and/or Tardive Dyskinesia noted.  ? ?Wound to Left Wrist: No drainage noted. Erythema present. No inflammation noted. Pt continues to apply antibiotic ointment to wound. ?

## 2021-05-04 NOTE — [facility] Group Notes (Signed)
Group Date: 05/04/2021 ?Start Time: 10:00am ?End Time: 11:00am ?  ?  ?Type of Therapy and Topic:  Group Therapy: Anger ?  ?Participation Level:  Active ?  ?Description of Group:   Due to the illness on the unit, Infectious Disease has recommended no close contact at this time so group was not held.  Patient was provided with written information and worksheets about anger.  The patient was on the telephone, so CSW could not review the paperwork with her.  She wanted to know if we can check and see if her Medicaid is active yet. ?  ?  ?  ?Selmer Dominion, LCSW ?05/04/2021 ?

## 2021-05-04 NOTE — Progress Notes (Signed)
?   05/04/21 2353  ?Psych Admission Type (Psych Patients Only)  ?Admission Status Voluntary  ?Psychosocial Assessment  ?Patient Complaints None  ?Eye Contact Fair  ?Facial Expression Flat  ?Affect Appropriate to circumstance  ?Speech Logical/coherent  ?Interaction Assertive  ?Motor Activity Other (Comment) ?(WDL)  ?Appearance/Hygiene Unremarkable  ?Behavior Characteristics Appropriate to situation  ?Mood Depressed  ?Thought Process  ?Coherency WDL  ?Content WDL  ?Delusions None reported or observed  ?Perception WDL  ?Hallucination None reported or observed  ?Confusion None  ?Danger to Self  ?Current suicidal ideation? Denies  ?Self-Injurious Behavior No self-injurious ideation or behavior indicators observed or expressed   ?Agreement Not to Harm Self Yes  ?Description of Agreement verbal  ?Danger to Others  ?Danger to Others None reported or observed  ? ? ?

## 2021-05-04 NOTE — Progress Notes (Signed)
The evening group did not occur due to the illness on the unit.  ?

## 2021-05-04 NOTE — Progress Notes (Signed)
[facility] MD Progress Note ? ?05/04/2021 3:44 PM ?Monique Lyons  ?MRN:  [id number] ? ?Subjective:  [name] reports: "I am sleepy, but doing much better. I feel like there is a medication that is making me sleep at night and during the day time. I don't know what it is though." ? ?Today's assessment (05/04/2021): For this encounter, pt is seen in her room on the 400 hall. Pt's chart reviewed, case discussed with her treatment team. Pt with a euthymic mood, & affect is appropriate and congruent. Pt's attention to personal hygiene and grooming is fair, eye contact is good, speech is clear & coherent. Thought contents are organized and logical, and pt currently denies SI/HI/AVH or paranoia. There is no evidence of delusional thoughts.   ? ?Pt seems to have poor insight regarding the events leading up to her current admission. She continues to report that alcohol and drug use are not a problem for her, and continues to state that it was a one time occurrence where she used drugs and alcohol out of anger regarding something that was happening in her life. As per chart review, she was + for Amphetamines, Benzos, Cocaine, and marijuana. She was also + for cocaine and marijuana in July of 2018. Pt states that she lives with a friend, and is going back to that living arrangement. She states that she takes care of the friend's twin children who are 88 yrs old. She is unable to consent for collateral information to be obtained from this friend, and states that the friend will not want to be involved. She is unable to provide consent for the friend to be contacted for discharge planning as well. Pt has signed a 72 hr notice for discharge, which became effective on 05/03/21 at 18:18 pm. Pt does not have a solid & safe discharge plan at this time, and involuntary commitment is being pursued by the attending Psychiatrist. ? ?Pt reports that she is tolerating Zoloft 50 mg daily for her depressive symptoms, and has no side effects. Neurontin  was increased from 100 mg to 200 mg TID yesterday for management of her anxiety. Due to reports of daytime sedation, will change Neurontin to PRN. Hydroxyzine 50 mg at bedtime discontinue due to reports of sedation. She reports a good appetite. Left wrist scab is scabbing over, area is clean/dry/intact with no edema/drainage noted.  Will implement the changes above as well as continue the other medications listed below. ? ?Labs reviewed: CMP, CBC, lipid profile, Hemoglobin A1C, & TSH all WNL. Urine pregnancy test negative. Baseline EKG WNL, with QTC of 430. UA ordered, and with rare bacteria, but negative for leukocytes. Pt is asymptomatic. No treatment necessary at this time. ? ?HPI: Monique Lyons is a 36 y.o Caucasian female who walked into the [facility] with complaints of worsening depression and suicide attempt via cutting self on left wrist. Pt was admitted voluntarily for treatment and stabilization of her mood. ? ?Principal Problem: MDD (major depressive disorder), recurrent severe, without psychosis (HCC) ?Diagnosis: Principal Problem: ?  MDD (major depressive disorder), recurrent severe, without psychosis (HCC) ?Active Problems: ?  GAD (generalized anxiety disorder) ?  Alcohol use ? ?Total Time spent with patient: 20 minutes ? ?Past Psychiatric History: As above ? ?Past Medical History:  ?Past Medical History:  ?Diagnosis Date  ? Bilateral posterior subcapsular polar cataract   ? Fibromyalgia   ? Hypertension   ? notified of elevated BP in Jluly 2018  ? No pertinent past medical history   ?  Ovarian cyst   ?  ?Past Surgical History:  ?Procedure Laterality Date  ? CESAREAN SECTION    ? first was c-section; second vbac  ? CESAREAN SECTION  December 28, 2003  ? TUBAL LIGATION Bilateral 10/17/2012  ? Procedure: POST PARTUM TUBAL LIGATION bilateral;  Surgeon: Lazaro ArmsLuther H Eure, MD;  Location: [facility];  Service: Gynecology;  Laterality: Bilateral;  ? ?Family History:  ?Family History  ?Problem Relation Age of Onset  ?  Endometriosis Mother   ? Heart murmur Mother   ? ?Family Psychiatric  History: None reported ?Social History:  ?Social History  ? ?Substance and Sexual Activity  ?Alcohol Use Yes  ? Alcohol/week: 3.0 standard drinks  ? Types: 2 Cans of beer, 1 Standard drinks or equivalent per week  ? Comment: occas  ?   ?Social History  ? ?Substance and Sexual Activity  ?Drug Use Yes  ? Types: "Crack" cocaine, Marijuana, MDMA (Ecstacy)  ? Comment: "Burgess EstelleYesterday was my first time, I just wanted to die" 04/30/21  ?  ?Social History  ? ?Socioeconomic History  ? Marital status: Single  ?  Spouse name: Not on file  ? Number of children: Not on file  ? Years of education: Not on file  ? Highest education level: Not on file  ?Occupational History  ? Not on file  ?Tobacco Use  ? Smoking status: Every Day  ?  Packs/day: 0.50  ?  Types: Cigarettes  ? Smokeless tobacco: Never  ?Vaping Use  ? Vaping Use: Never used  ?Substance and Sexual Activity  ? Alcohol use: Yes  ?  Alcohol/week: 3.0 standard drinks  ?  Types: 2 Cans of beer, 1 Standard drinks or equivalent per week  ?  Comment: occas  ? Drug use: Yes  ?  Types: "Crack" cocaine, Marijuana, MDMA (Ecstacy)  ?  Comment: "Burgess EstelleYesterday was my first time, I just wanted to die" 04/30/21  ? Sexual activity: Yes  ?  Birth control/protection: Surgical  ?Other Topics Concern  ? Not on file  ?Social History Narrative  ? Not on file  ? ?Social Determinants of Health  ? ?Financial Resource Strain: Not on file  ?Food Insecurity: Not on file  ?Transportation Needs: Not on file  ?Physical Activity: Not on file  ?Stress: Not on file  ?Social Connections: Not on file  ?  ?Sleep: Good ? ?Appetite:  Good ? ?Current Medications: ?Current Facility-Administered Medications  ?Medication Dose Route Frequency Provider Last Rate Last Admin  ? alum & mag hydroxide-simeth (MAALOX/MYLANTA) 200-200-20 MG/5ML suspension 30 mL  30 mL Oral Q4H PRN Melbourne Abtsaylor, Cody W, PA-C      ? bacitracin ointment   Topical BID Comer LocketSingleton, Amy E, MD    [phone] application. at 05/04/21 0810  ? gabapentin (NEURONTIN) capsule 200 mg  200 mg Oral TID PRN Starleen BlueNkwenti, Ladell Lea, NP      ? magnesium hydroxide (MILK OF MAGNESIA) suspension 30 mL  30 mL Oral Daily PRN Jaclyn Shaggyaylor, Cody W, PA-C      ? multivitamin with minerals tablet 1 tablet  1 tablet Oral Daily Jaclyn Shaggyaylor, Cody W, PA-C   1 tablet at 05/04/21 0810  ? nicotine (NICODERM CQ - dosed in mg/24 hours) patch 14 mg  14 mg Transdermal Daily Jaclyn Shaggyaylor, Cody W, PA-C   14 mg at 05/03/21 [id number]  ? phenylephrine-shark liver oil-mineral oil-petrolatum (PREPARATION H) rectal ointment 1 application.  1 application. Rectal BID PRN Massengill, Harrold DonathNathan, MD      ? sertraline (ZOLOFT) tablet 50 mg  50 mg Oral Daily Starleen Blue, NP   50 mg at 05/04/21 3570  ? thiamine tablet 100 mg  100 mg Oral Daily Jaclyn Shaggy, PA-C   100 mg at 05/04/21 1779  ? ?Lab Results:  ?No results found for this or any previous visit (from the past 48 hour(s)). ? ?Blood Alcohol level:  ?Lab Results  ?Component Value Date  ? ETH 179 (H) 10/28/2014  ? ETH <5 09/04/2014  ? ?Metabolic Disorder Labs: ?Lab Results  ?Component Value Date  ? HGBA1C 4.7 (L) 05/01/2021  ? MPG 88.19 05/01/2021  ? ?No results found for: PROLACTIN ?Lab Results  ?Component Value Date  ? CHOL 126 05/01/2021  ? TRIG 52 05/01/2021  ? HDL 65 05/01/2021  ? CHOLHDL 1.9 05/01/2021  ? VLDL 10 05/01/2021  ? LDLCALC 51 05/01/2021  ? ?Physical Findings: ?AIMS: Facial and Oral Movements ?Muscles of Facial Expression: None, normal ?Lips and Perioral Area: None, normal ?Jaw: None, normal ?Tongue: None, normal,Extremity Movements ?Upper (arms, wrists, hands, fingers): None, normal ?Lower (legs, knees, ankles, toes): None, normal, Trunk Movements ?Neck, shoulders, hips: None, normal, Overall Severity ?Severity of abnormal movements (highest score from questions above): None, normal ?Incapacitation due to abnormal movements: None, normal ?Patient's awareness of abnormal movements (rate only patient's report): No  Awareness, Dental Status ?Current problems with teeth and/or dentures?: No ?Does patient usually wear dentures?: No  ?CIWA:  CIWA-Ar Total: 0 ?COWS:    ? ?Musculoskeletal: ?Strength & Muscle Tone: within

## 2021-05-05 NOTE — Group Note (Signed)
LCSW Group Therapy ? ? ?CSW group not facilitated due to unit limitations on patient proximity/interactions due to infection prevention measures. ? ?Tamecca Artiga T Jahari Wiginton LCSWA  ?10:36 AM  ?

## 2021-05-05 NOTE — Progress Notes (Signed)
[facility] MD Progress Note ? ?05/05/2021 12:37 PM ?Caralee Ates  ?MRN:  [id number] ? ?Subjective:  Khristina reports: "I am good. I don't know why you would tell the Doctor when to discharge me. You have only seen me for two days. If someone would listen to me, then I should have been getting out of here today. Now I have to wait until tomorrow." ? ?Today's assessment (05/05/2021): For this encounter, pt is seen in her room on the 400 hall. Pt's chart reviewed, case discussed with her treatment team. Pt with an angry mood, & affect is and affect congruent. Pt's attention to personal hygiene and grooming is fair, eye contact is good, speech is clear & coherent. Thought contents are organized and logical, and pt currently denies SI/HI/AVH or paranoia. There is no evidence of delusional thoughts.   ? ?Pt continue to have poor insight regarding the reasons leading up to this hospitalization. She accuses of Clinical research associate of asking the Physician to keep her hospitalized longer that she should have been. Pt had signed a 72 hr notice for discharge, which became effective on 05/03/21 at 18:18 pm. Pt did not have a solid & safe discharge plan at that time, and also did not have one yesterday when she was assessed by this Clinical research associate. Pt stated yesterday that she lives with a friend, and is going back to that living arrangement. She stated that she takes care of the friend's twin children who are 70 yrs old. Pt was + for Amphetamines, Benzos, Cocaine, and marijuana on admission. She was also + for cocaine and marijuana in July of 2018. She refused to consent for collateral information to be obtained from this friend, and stated that the friend would not want to be involved with her hospitalization. She refused to provide consent for the friend to be contacted for discharge planning as well. As a result of pt's impulsivity and the fact that she self injured prior to this hospitalization, involuntary commitment was sought by attending Psychiatrist  yesterday. Today, pt has provided the name of a friend called Dominga Ferry. She wants for her to be contacted via phone at [phone] for discharge and safety planning. This information will be relayed to the social worker so that consent can be obtained for this contact to be contacted for discharge & safety planning. ? ?Pt reports that she is continuing to tolerate Zoloft 50 mg daily for her depressive symptoms, and has no side effects. Neurontin was increased recently from 100 mg to 200 mg TID for management of her anxiety. Due to reports of daytime sedation, it was changed to a PRN medication. Hydroxyzine 50 mg at bedtime was discontinued due to reports of sedation during the day. She reports a good appetite. Left wrist wound with dressing which is clean/dry/intact with no drainage noted to area.  Will continue Zoloft 50mg  the other medications listed below. ? ?Labs reviewed: CMP, CBC, lipid profile, Hemoglobin A1C, & TSH all WNL. Urine pregnancy test negative. Baseline EKG WNL, with QTC of 430. UA ordered, and with rare bacteria, but negative for leukocytes. Pt is asymptomatic. No treatment necessary at this time. ? ?HPI: Effa Yarrow. Hruska is a 36 y.o Caucasian female who walked into the [facility] with complaints of worsening depression and suicide attempt via cutting self on left wrist. Pt was admitted voluntarily for treatment and stabilization of her mood. ? ?Principal Problem: MDD (major depressive disorder), recurrent severe, without psychosis (HCC) ?Diagnosis: Principal Problem: ?  MDD (  major depressive disorder), recurrent severe, without psychosis (HCC) ?Active Problems: ?  GAD (generalized anxiety disorder) ?  Alcohol use ? ?Total Time spent with patient: 20 minutes ? ?Past Psychiatric History: As above ? ?Past Medical History:  ?Past Medical History:  ?Diagnosis Date  ? Bilateral posterior subcapsular polar cataract   ? Fibromyalgia   ? Hypertension   ? notified of elevated BP in Jluly 2018  ? No  pertinent past medical history   ? Ovarian cyst   ?  ?Past Surgical History:  ?Procedure Laterality Date  ? CESAREAN SECTION    ? first was c-section; second vbac  ? CESAREAN SECTION  December 28, 2003  ? TUBAL LIGATION Bilateral 10/17/2012  ? Procedure: POST PARTUM TUBAL LIGATION bilateral;  Surgeon: Lazaro Arms, MD;  Location: [facility];  Service: Gynecology;  Laterality: Bilateral;  ? ?Family History:  ?Family History  ?Problem Relation Age of Onset  ? Endometriosis Mother   ? Heart murmur Mother   ? ?Family Psychiatric  History: None reported ?Social History:  ?Social History  ? ?Substance and Sexual Activity  ?Alcohol Use Yes  ? Alcohol/week: 3.0 standard drinks  ? Types: 2 Cans of beer, 1 Standard drinks or equivalent per week  ? Comment: occas  ?   ?Social History  ? ?Substance and Sexual Activity  ?Drug Use Yes  ? Types: "Crack" cocaine, Marijuana, MDMA (Ecstacy)  ? Comment: "Burgess Estelle was my first time, I just wanted to die" 04/30/21  ?  ?Social History  ? ?Socioeconomic History  ? Marital status: Single  ?  Spouse name: Not on file  ? Number of children: Not on file  ? Years of education: Not on file  ? Highest education level: Not on file  ?Occupational History  ? Not on file  ?Tobacco Use  ? Smoking status: Every Day  ?  Packs/day: 0.50  ?  Types: Cigarettes  ? Smokeless tobacco: Never  ?Vaping Use  ? Vaping Use: Never used  ?Substance and Sexual Activity  ? Alcohol use: Yes  ?  Alcohol/week: 3.0 standard drinks  ?  Types: 2 Cans of beer, 1 Standard drinks or equivalent per week  ?  Comment: occas  ? Drug use: Yes  ?  Types: "Crack" cocaine, Marijuana, MDMA (Ecstacy)  ?  Comment: "Burgess Estelle was my first time, I just wanted to die" 04/30/21  ? Sexual activity: Yes  ?  Birth control/protection: Surgical  ?Other Topics Concern  ? Not on file  ?Social History Narrative  ? Not on file  ? ?Social Determinants of Health  ? ?Financial Resource Strain: Not on file  ?Food Insecurity: Not on file  ?Transportation  Needs: Not on file  ?Physical Activity: Not on file  ?Stress: Not on file  ?Social Connections: Not on file  ?  ?Sleep: Good ? ?Appetite:  Good ? ?Current Medications: ?Current Facility-Administered Medications  ?Medication Dose Route Frequency Provider Last Rate Last Admin  ? alum & mag hydroxide-simeth (MAALOX/MYLANTA) 200-200-20 MG/5ML suspension 30 mL  30 mL Oral Q4H PRN Melbourne Abts W, PA-C      ? bacitracin ointment   Topical BID Comer Locket, MD   [phone] application. at 05/04/21 1617  ? gabapentin (NEURONTIN) capsule 200 mg  200 mg Oral TID PRN Starleen Blue, NP   200 mg at 05/04/21 2132  ? magnesium hydroxide (MILK OF MAGNESIA) suspension 30 mL  30 mL Oral Daily PRN Jaclyn Shaggy, PA-C      ? multivitamin  with minerals tablet 1 tablet  1 tablet Oral Daily Jaclyn Shaggyaylor, Cody W, PA-C   1 tablet at 05/05/21 [id number]  ? nicotine (NICODERM CQ - dosed in mg/24 hours) patch 14 mg  14 mg Transdermal Daily Jaclyn Shaggyaylor, Cody W, PA-C   14 mg at 05/03/21 [id number]  ? phenylephrine-shark liver oil-mineral oil-petrolatum (PREPARATION H) rectal ointment 1 application.  1 application. Rectal BID PRN Massengill, Harrold DonathNathan, MD      ? sertraline (ZOLOFT) tablet 100 mg  100 mg Oral Daily Mariel CraftMaurer, Sheila M, MD   100 mg at 05/05/21 0749  ? thiamine tablet 100 mg  100 mg Oral Daily Melbourne Abtsaylor, Cody W, PA-C   100 mg at 05/05/21 [id number]  ? ?Lab Results:  ?No results found for this or any previous visit (from the past 48 hour(s)). ? ?Blood Alcohol level:  ?Lab Results  ?Component Value Date  ? ETH 179 (H) 10/28/2014  ? ETH <5 09/04/2014  ? ?Metabolic Disorder Labs: ?Lab Results  ?Component Value Date  ? HGBA1C 4.7 (L) 05/01/2021  ? MPG 88.19 05/01/2021  ? ?No results found for: PROLACTIN ?Lab Results  ?Component Value Date  ? CHOL 126 05/01/2021  ? TRIG 52 05/01/2021  ? HDL 65 05/01/2021  ? CHOLHDL 1.9 05/01/2021  ? VLDL 10 05/01/2021  ? LDLCALC 51 05/01/2021  ? ?Physical Findings: ?AIMS: Facial and Oral Movements ?Muscles of Facial Expression: None,  normal ?Lips and Perioral Area: None, normal ?Jaw: None, normal ?Tongue: None, normal,Extremity Movements ?Upper (arms, wrists, hands, fingers): None, normal ?Lower (legs, knees, ankles, toes): None, normal, Trunk Mov

## 2021-05-05 NOTE — Progress Notes (Signed)
Wound Care: ?Writer assessed the wound bed. Wound bed shows no drainage. Erythema remains. No inflammation noted. Writer applied prescribed Bacitracin ointment and applied gauze and tape to cover. Pt denies any pain.  ?

## 2021-05-05 NOTE — Progress Notes (Signed)
Pt c/o anxiety 8/10. "I can tell I'm about to come on my period and I'm getting irritable and anxious. Can I have something for it?" Writer offered PRN for anxiety. Pt agreed. Pt denies any further concerns and/or requests at this time.  ?

## 2021-05-05 NOTE — [facility] Suicide Risk Assessment (Signed)
Clinical Social Work Note ? ?Weekend CSW called to speak with pt's friend Maura Crandall at the Dr.'s request to determine if pt regularly drinks, as the pt states they only drank prior to admission. The goal of the call was also to determine if the pt would be staying with Docia Barrier or if Docia Barrier would be the one to pick her up at discharge. Docia Barrier stated that pt used to drink daily, but now drinks a few times a week. She also reports that the pt does not drink while watching her children, and that she has camera in the home to confirm this. She stated she would be the one to pick up pt, and is discussed if it is possible for the pt to live with her or not. ?Darrick Meigs [place], [place] ?05/05/2021 1:47 PM  ? ?

## 2021-05-05 NOTE — Progress Notes (Signed)
Pt is A&OX4, calm, denies suicidal ideations, denies homicidal ideations, denies auditory hallucinations and denies visual hallucinations. Pt reports sleeping well and denies experiencing nightmares. Mood and affect are congruent. Pt appetite is ok. No complaints of distress, pain and/or discomfort at this time. Pt's memory appears to be grossly intact, and Pt hasn?t displayed any injurious behaviors. Pt is medication compliant. There?s no evidence of suicidal intent. Psychomotor activity was WNL. No s/s of Parkinson, Dystonia, Akathisia and/or Tardive Dyskinesia noted. ?

## 2021-05-06 DIAGNOSIS — F332 Major depressive disorder, recurrent severe without psychotic features: Secondary | ICD-10-CM

## 2021-05-06 MED ORDER — SERTRALINE HCL 100 MG PO TABS: 100.0000 mg | ORAL_TABLET | Freq: Every day | ORAL | 0 refills | Status: DC

## 2021-05-06 MED ORDER — GABAPENTIN 100 MG PO CAPS
100.0000 mg | ORAL_CAPSULE | Freq: Three times a day (TID) | ORAL | Status: DC
  Filled 2021-05-06 (×2): qty 21
  Filled 2021-05-06: qty 1
  Filled 2021-05-06: qty 21
  Filled 2021-05-06: qty 1

## 2021-05-06 MED ORDER — NICOTINE 14 MG/24HR TD PT24: 14.0000 mg | MEDICATED_PATCH | Freq: Every day | TRANSDERMAL | 0 refills | Status: AC

## 2021-05-06 MED ORDER — GABAPENTIN 100 MG PO CAPS: 100.0000 mg | ORAL_CAPSULE | Freq: Three times a day (TID) | ORAL | 0 refills | Status: AC

## 2021-05-06 NOTE — Progress Notes (Signed)
?  [facility] Adult Case Management Discharge Plan : ? ?Will you be returning to the same living situation after discharge:  No. Will be staying with friend ?At discharge, do you have transportation home?: Yes,  friend to pick this patient up ?Do you have the ability to pay for your medications: No. Samples to be provided at discharge ? ?Release of information consent forms completed and in the chart;  Patient's signature needed at discharge. ? ?Patient to Follow up at: ? Follow-up Information   ? ? [place] Go to.   ?Specialty: Professional Counselor ?Why: Please go to this provider for therapy and medication management services during walk in hours for new patients:  Monday through Friday, 9:00 am to 1:00 pm. ?Contact information: ?[address] ?[place] [address] ?[phone] ? ? ?  ?  ? ?  ?  ? ?  ? ? ?Next level of care provider has access to [facility] Link:no ? ?Safety Planning and Suicide Prevention discussed: Yes,  with friend ? ?  ? ?Has patient been referred to the Quitline?: Yes, faxed on 05/06/2021 ? ?Patient has been referred for addiction treatment: Pt. refused referral ? ?Otelia Santee, LCSW ?05/06/2021, 10:56 AM ?

## 2021-05-06 NOTE — Discharge Summary (Addendum)
Physician Discharge Summary Note ? ?Patient:  Monique Lyons is an 36 y.o., female ?MRN:  [id number] ?DOB:  [birth year] ?Patient phone:  [phone] (home)  ?Patient address:   ?[address] [place] [facility] [address],  ?Total Time spent with patient: 30 minutes ? ?Date of Admission:  05/01/2021 ?Date of Discharge: 05/06/2021 ? ?Reason for Admission: Monique Lyons is a 36 y.o Caucasian female who walked into the [facility] with complaints of worsening depression and suicide attempt via cutting self on left wrist. Pt was admitted voluntarily for treatment and stabilization of her mood. ? ?Principal Problem: MDD (major depressive disorder), recurrent severe, without psychosis (HCC) ?Discharge Diagnoses: Principal Problem: ?  MDD (major depressive disorder), recurrent severe, without psychosis (HCC) ?Active Problems: ?  GAD (generalized anxiety disorder) ?  Alcohol use ? ?Past Psychiatric History: As above ? ?Past Medical History:  ?Past Medical History:  ?Diagnosis Date  ? Bilateral posterior subcapsular polar cataract   ? Fibromyalgia   ? Hypertension   ? notified of elevated BP in Jluly 2018  ? No pertinent past medical history   ? Ovarian cyst   ?  ?Past Surgical History:  ?Procedure Laterality Date  ? CESAREAN SECTION    ? first was c-section; second vbac  ? CESAREAN SECTION  December 28, 2003  ? TUBAL LIGATION Bilateral 10/17/2012  ? Procedure: POST PARTUM TUBAL LIGATION bilateral;  Surgeon: Lazaro Arms, MD;  Location: [facility];  Service: Gynecology;  Laterality: Bilateral;  ? ?Family History:  ?Family History  ?Problem Relation Age of Onset  ? Endometriosis Mother   ? Heart murmur Mother   ? ?Family Psychiatric  History: None reported ?Social History:  ?Social History  ? ?Substance and Sexual Activity  ?Alcohol Use Yes  ? Alcohol/week: 3.0 standard drinks  ? Types: 2 Cans of beer, 1 Standard drinks or equivalent per week  ? Comment: occas  ?   ?Social History  ? ?Substance and Sexual Activity  ?Drug Use Yes  ?  Types: "Crack" cocaine, Marijuana, MDMA (Ecstacy)  ? Comment: "Burgess Estelle was my first time, I just wanted to die" 04/30/21  ?  ?Social History  ? ?Socioeconomic History  ? Marital status: Single  ?  Spouse name: Not on file  ? Number of children: Not on file  ? Years of education: Not on file  ? Highest education level: Not on file  ?Occupational History  ? Not on file  ?Tobacco Use  ? Smoking status: Every Day  ?  Packs/day: 0.50  ?  Types: Cigarettes  ? Smokeless tobacco: Never  ?Vaping Use  ? Vaping Use: Never used  ?Substance and Sexual Activity  ? Alcohol use: Yes  ?  Alcohol/week: 3.0 standard drinks  ?  Types: 2 Cans of beer, 1 Standard drinks or equivalent per week  ?  Comment: occas  ? Drug use: Yes  ?  Types: "Crack" cocaine, Marijuana, MDMA (Ecstacy)  ?  Comment: "Burgess Estelle was my first time, I just wanted to die" 04/30/21  ? Sexual activity: Yes  ?  Birth control/protection: Surgical  ?Other Topics Concern  ? Not on file  ?Social History Narrative  ? Not on file  ? ?Social Determinants of Health  ? ?Financial Resource Strain: Not on file  ?Food Insecurity: Not on file  ?Transportation Needs: Not on file  ?Physical Activity: Not on file  ?Stress: Not on file  ?Social Connections: Not on file  ? ?  Hospital Course   ?During the patient's hospitalization, patient had extensive initial psychiatric evaluation, and follow-up psychiatric evaluations every day.  Psychiatric diagnoses provided & medications started upon initial assessment were as follows: ?  ?MDD, recurrent, severe without psychotic features ?R/o substance induced mood disorder ?R/o bipolar disorder ?Generalized anxiety disorder ?Social anxiety disorder ?-Start Zoloft 25 mg daily. (R/b/se/a discussed and patient agrees with medication trial) ?-Start Neurontin 100 mg 3 times daily.(R/b/se/a discussed and patient agrees with medication trial) ?  ?Alcohol Withdrawal ?-CIWA with Ativan as needed for CIWA greater than  10 ?-Continue thiamine 100 mg daily. ?-Continue multivitamin with minerals daily. ?-Continue Zofran 4 mg every 6 hours as needed for nausea or vomiting. ?-Continue Imodium 2 to 4 mg as needed for diarrhea or loose stools for 72 hours.  ?Nicotine dependence ?Nicotine patch 14 mg daily. ?  ?During the hospitalization, other adjustments were made to the patient's psychiatric medication regimen with the final discharge medications being as follows: ?  ?MDD, recurrent, severe without psychotic features ?R/o substance induced mood disorder ?R/o bipolar disorder ?Generalized anxiety disorder ?Social anxiety disorder ?-Continue Zoloft 100 mg daily for MDD.  (R/b/se/a discussed and patient agrees with medication trial) ?-Continue Neurontin to 100 mg 3 times  (R/b/se/a discussed and patient agrees with medication trial) ?  ?Alcohol Withdrawal ?PRN Ativan protocol completed. Pt denied any signs/symptoms of alcohol withdrawal through entire hospitalization. She refused alcohol treatment/rehab referrals, and stated that the alcohol use was "a one time thing. I was at the wrong place at the wrong time). ?  ?Nicotine dependence ?Nicotine patch 14 mg daily. ?  ?Patient's care was discussed during the interdisciplinary team meeting every day during the hospitalization. The patient denies having side effects to prescribed psychiatric medication. ?The patient was evaluated each day by a clinical provider to ascertain response to treatment. Improvement was noted by the patient's report of decreasing symptoms, improved sleep and appetite, affect, medication tolerance, behavior, and participation in unit programming.  Patient was asked each day to complete a self inventory noting mood, mental status, pain, new symptoms, anxiety and concerns.   ?Symptoms were reported as significantly decreased or resolved completely by discharge.  ?The patient reports that their mood is stable.  ?The patient denied having suicidal thoughts for more than  48 hours prior to discharge.  Patient denies having homicidal thoughts.  Patient denies having auditory hallucinations.  Patient denies any visual hallucinations or other symptoms of psychosis.  ?The patient was motivated to continue taking medication with a goal of continued improvement in mental health.  ?  ?The patient reports their target psychiatric symptoms of depression responded well to the psychiatric medications, and the patient reports overall benefit from this psychiatric hospitalization. Supportive psychotherapy was provided to the patient. The patient also participated in regular group therapy while hospitalized. Coping skills, problem solving as well as relaxation therapies were also part of the unit programming. ?  ?Labs were reviewed with the patient, and abnormal results were discussed with the patient. Urinalysis came back with cloudy urine and rare bacteria. Pt denies any signs/symptoms of a urinary tract infection. She has been educated to follow up with her PCP should she start having symptoms of a UTI. Treatment is not necessary at this time. ? ?Physical Findings: ?AIMS: Facial and Oral Movements ?Muscles of Facial Expression: None, normal ?Lips and Perioral Area: None, normal ?Jaw: None, normal ?Tongue: None, normal,Extremity Movements ?Upper (arms, wrists, hands, fingers): None, normal ?Lower (legs, knees, ankles, toes): None, normal, Trunk  Movements ?Neck, shoulders, hips: None, normal, Overall Severity ?Severity of abnormal movements (highest score from questions above): None, normal ?Incapacitation due to abnormal movements: None, normal ?Patient's awareness of abnormal movements (rate only patient's report): No Awareness, Dental Status ?Current problems with teeth and/or dentures?: No ?Does patient usually wear dentures?: No  ?CIWA:  CIWA-Ar Total: 1 ?COWS:  n/a ? ?Musculoskeletal: ?Strength & Muscle Tone: within normal limits ?Gait & Station: normal ?Patient leans: N/A ? ?Psychiatric  Specialty Exam: ? ?Presentation  ?General Appearance: Appropriate for Environment ? ?Eye Contact:Fair ? ?Speech:Clear and Coherent ? ?Speech Volume:Normal ? ?Handedness:Right ? ?Mood and Affect  ?Mood:Euthymi

## 2021-05-06 NOTE — Progress Notes (Signed)
RN met with pt and reviewed pt's discharge instructions.  Pt verbalized understanding of discharge instructions and pt did not have any questions. RN reviewed and provided pt with a copy of SRA, AVS and Transition Record.  RN returned pt's belongings to pt.  Paper prescriptions and medication samples were given to pt.  Pt denied SI/HI/AVH and voiced no concerns.  Pt was appreciative of the care pt received at [facility].  Patient discharged to the lobby without incident.  

## 2021-05-06 NOTE — [facility] Suicide Risk Assessment (Addendum)
Suicide Risk Assessment ? ?Discharge Assessment    ?[facility] ? ? ?Principal Problem: MDD (major depressive disorder), recurrent severe, without psychosis (HCC) ?Discharge Diagnoses: Principal Problem: ?  MDD (major depressive disorder), recurrent severe, without psychosis (HCC) ?Active Problems: ?  GAD (generalized anxiety disorder) ?  Alcohol use ? ?Reason for Admission: Monique Lyons BatonBuckner is a 36 y.o Caucasian female [facility] of worsening depression and suicide attempt via cutting self on left wrist. Pt was admitted voluntarily for treatment and stabilization of her mood. ? ?HOSPITAL COURSE: ?During the patient's hospitalization, patient had extensive initial psychiatric evaluation, and follow-up psychiatric evaluations every day.  Psychiatric diagnoses provided & medications started upon initial assessment were as follows: ? ?MDD, recurrent, severe without psychotic features ?R/o substance induced mood disorder ?R/o bipolar disorder ?Generalized anxiety disorder ?Social anxiety disorder ?-Start Zoloft 25 mg daily. (R/b/se/a discussed and patient agrees with medication trial) ?-Start Neurontin 100 mg 3 times daily.(R/b/se/a discussed and patient agrees with medication trial) ?  ?Alcohol Withdrawal ?-CIWA with Ativan as needed for CIWA greater than 10 ?-Continue thiamine 100 mg daily. ?-Continue multivitamin with minerals daily. ?-Continue Zofran 4 mg every 6 hours as needed for nausea or vomiting. ?-Continue Imodium 2 to 4 mg as needed for diarrhea or loose stools for 72 hours.  ?Nicotine dependence ?Nicotine patch 14 mg daily. ? ?During the hospitalization, other adjustments were made to the patient's psychiatric medication regimen with the final discharge medications being as follows: ? ?MDD, recurrent, severe without psychotic features ?R/o substance induced mood disorder ?R/o bipolar disorder ?Generalized anxiety disorder ?Social anxiety  disorder ?-Continue Zoloft 100 mg daily for MDD.  (R/b/se/a discussed and patient agrees with medication trial) ?-Continue Neurontin to 100 mg 3 times  (R/b/se/a discussed and patient agrees with medication trial) ?  ?Alcohol Withdrawal ?PRN Ativan protocol completed. Pt denied any signs/symptoms of alcohol withdrawal through entire hospitalization. She refused alcohol treatment/rehab referrals, and stated that the alcohol use was "a one time thing. I was at the wrong place at the wrong time). ?  ?Nicotine dependence ?Nicotine patch 14 mg daily. ? ?Patient's care was discussed during the interdisciplinary team meeting every day during the hospitalization. The patient denies having side effects to prescribed psychiatric medication. ?The patient was evaluated each day by a clinical provider to ascertain response to treatment. Improvement was noted by the patient's report of decreasing symptoms, improved sleep and appetite, affect, medication tolerance, behavior, and participation in unit programming.  Patient was asked each day to complete a self inventory noting mood, mental status, pain, new symptoms, anxiety and concerns.   ?Symptoms were reported as significantly decreased or resolved completely by discharge.  ?The patient reports that their mood is stable.  ?The patient denied having suicidal thoughts for more than 48 hours prior to discharge.  Patient denies having homicidal thoughts.  Patient denies having auditory hallucinations.  Patient denies any visual hallucinations or other symptoms of psychosis.  ?The patient was motivated to continue taking medication with a goal of continued improvement in mental health.  ? ?The patient reports their target psychiatric symptoms of depression responded well to the psychiatric medications, and the patient reports overall benefit from this psychiatric hospitalization. Supportive psychotherapy was provided to the patient. The patient also participated in regular group  therapy while hospitalized. Coping skills, problem solving as well as relaxation therapies were also part of the unit programming. ? ?Labs were reviewed with the patient, and abnormal  results were discussed with the patient. Urinalysis came back with cloudy urine and rare bacteria. Pt denies any signs/symptoms of a urinary tract infection. She has been educated to follow up with her PCP should she start having symptoms of a UTI. Treatment is not necessary at this time. ? ?Total Time spent with patient: 30 minutes ? ?Musculoskeletal: ?Strength & Muscle Tone: within normal limits ?Gait & Station: normal ?Patient leans: N/A ? ?Psychiatric Specialty Exam ? ?Presentation  ?General Appearance: Appropriate for Environment ? ?Eye Contact:Fair ? ?Speech:Clear and Coherent ? ?Speech Volume:Normal ? ?Handedness:Right ? ? ?Mood and Affect  ?Mood:Euthymic ? ?Duration of Depression Symptoms: No data recorded ?Affect:Appropriate ? ? ?Thought Process  ?Thought Processes:Coherent ? ?Descriptions of Associations:Intact ? ?Orientation:Full (Time, Place and Person) ? ?Thought Content:Logical ? ?History of Schizophrenia/Schizoaffective disorder:No data recorded ?Duration of Psychotic Symptoms:No data recorded ?Hallucinations:Hallucinations: None ? ?Ideas of Reference:None ? ?Suicidal Thoughts:Suicidal Thoughts: No ? ?Homicidal Thoughts:Homicidal Thoughts: No ? ? ?Sensorium  ?Memory:Immediate Good ? ?Judgment:Good ? ?Insight:Good ? ? ?Executive Functions  ?Concentration:Good ? ?Attention Span:Good ? ?Recall:Good ? ?Fund of Knowledge:Good ? ?Language:Good ? ? ?Psychomotor Activity  ?Psychomotor Activity:Psychomotor Activity: Normal ? ? ?Assets  ?Assets:Communication Skills ? ? ?Sleep  ?Sleep:Sleep: Good ? ? ?Physical Exam: ?Physical Exam ?Constitutional:   ?   Appearance: Normal appearance.  ?HENT:  ?   Head: Normocephalic.  ?   Nose: No congestion or rhinorrhea.  ?Eyes:  ?   Pupils: Pupils are equal, round, and reactive to light.   ?Cardiovascular:  ?   Rate and Rhythm: Normal rate.  ?Pulmonary:  ?   Effort: Pulmonary effort is normal.  ?Musculoskeletal:     ?   General: Normal range of motion.  ?   Cervical back: Normal range of motion.  ?Neurological:  ?   General: No focal deficit present.  ?   Mental Status: She is alert and oriented to person, place, and time.  ?Psychiatric:     ?   Behavior: Behavior normal.     ?   Thought Content: Thought content normal.  ? ?Review of Systems  ?Constitutional: Negative.  Negative for fever.  ?HENT: Negative.  Negative for sore throat.   ?Eyes: Negative.   ?Respiratory: Negative.    ?Cardiovascular: Negative.  Negative for chest pain.  ?Gastrointestinal: Negative.  Negative for heartburn.  ?Genitourinary: Negative.   ?Musculoskeletal: Negative.   ?Skin: Negative.   ?Neurological: Negative.   ?Endo/Heme/Allergies:   ?     Allergies ?Amoxicillin & PCN  ?Psychiatric/Behavioral:  Positive for depression (Depressive symptoms are improving, pt denies SI/HI/AVH and verbally contracts for safety outside of the hospital.) and substance abuse. Negative for hallucinations and suicidal ideas. The patient is not nervous/anxious and does not have insomnia.   ?Blood pressure (!) 121/92, pulse 86, temperature 98.2 ?F (36.8 ?C), temperature source Oral, resp. rate 16, height 5\' 9"  (1.753 m), weight 68.9 kg, SpO2 98 %. Body mass index is 22.45 kg/m?. ? ?Mental Status Per Nursing Assessment::   ?On Admission:  Suicidal ideation indicated by patient ? ?Demographic Factors:  ?Caucasian and Unemployed ? ?Loss Factors: ?Financial problems/change in socioeconomic status ? ?Historical Factors: ?Impulsivity ? ?Risk Reduction Factors:   ?NA ? ?Continued Clinical Symptoms:  ?Pt reports resolution of depressive symptoms, and currently denies SI/HI/AVH, and verbally contracts for safety outside the hospital. ? ?Cognitive Features That Contribute To Risk:  ?None   ? ?Suicide Risk:  ?Mild: No identifiable suicidal ideation, intent or  plan at this time but has  h/o previous suicide attempt and previous SIB and had substance use on admission.  ? ? Follow-up Information   ? ? [place] Go to.   ?Specialty: Professional Counselor ?

## 2021-05-06 NOTE — Group Note (Signed)
Recreation Therapy Group Note ? ? ?Group Topic:Communication  ?Group Date: 05/06/2021 ?Start Time: 1055 ?End Time: 1125 ?Facilitators: Caroll Rancher, LRT,CTRS ?Location: [address] Dayroom ? ? ?Goal Area(s) Addresses:  ?Patient will effectively listen to complete activity.  ?Patient will identify communication skills used to make activity successful.  ?Patient will identify how skills used during activity can be used to reach post d/c goals.  ?  ?Group Description: Geometric Drawings.  Due to patients being confined to rooms, group was held in the hallway.  Three volunteers from the peer group will be shown an abstract picture with a particular arrangement of geometrical shapes.  Each round, one 'speaker' will describe the pattern, as accurately as possible without revealing the image to the group.  The remaining group members will listen and draw the picture to reflect how it is described to them. Patients with the role of 'listener' cannot ask clarifying questions but, may request that the speaker repeat a direction. Once the drawings are complete, the presenter will show the rest of the group the picture and compare how close each person came to drawing the picture. LRT will facilitate a post-activity discussion regarding effective communication and the importance of planning, listening, and asking for clarification in daily interactions with others. ? ? ?Affect/Mood: Happy ?  ?Participation Level: Engaged ?  ?Participation Quality: Independent ?  ?Behavior: Appropriate ?  ?Speech/Thought Process: Focused ?  ?Insight: Good ?  ?Judgement: Good ?  ?Modes of Intervention: Activity ?  ?Patient Response to Interventions:  Engaged ?  ?Education Outcome: ? Acknowledges education and In group clarification offered   ? ?Clinical Observations/Individualized Feedback: Pt was engaged and attentive.  Pt seemed to get flustered at times while describing the picture.  Pt wasn't really specific in directions to peers and had to  be told to slow down while giving instructions to give peers the opportunity to draw what was previously said.  Pt felt she could have given better direction on where things were to go.  Pt also learned you have to pay attention to what is being said.  Pt went on to say of herself that she reads things without thinking.    ? ? ?Plan: Continue to engage patient in RT group sessions 2-3x/week. ? ? ?Caroll Rancher, LRT,CTRS ?05/06/2021 12:47 PM ?

## 2021-05-06 NOTE — Progress Notes (Signed)
?   05/05/21 2200  ?Psych Admission Type (Psych Patients Only)  ?Admission Status Voluntary  ?Psychosocial Assessment  ?Patient Complaints None  ?Eye Contact Fair  ?Facial Expression Flat  ?Affect Appropriate to circumstance  ?Speech Logical/coherent  ?Interaction Assertive  ?Motor Activity Slow  ?Appearance/Hygiene Improved  ?Behavior Characteristics Cooperative;Appropriate to situation  ?Mood Pleasant  ?Thought Process  ?Coherency WDL  ?Content WDL  ?Delusions None reported or observed  ?Perception WDL  ?Hallucination None reported or observed  ?Judgment WDL  ?Confusion None  ?Danger to Self  ?Current suicidal ideation? Denies  ?Self-Injurious Behavior No self-injurious ideation or behavior indicators observed or expressed   ?Agreement Not to Harm Self Yes  ?Description of Agreement verbal  ?Danger to Others  ?Danger to Others None reported or observed  ? ? ?

## 2021-05-12 ENCOUNTER — Other Ambulatory Visit: Payer: Self-pay

## 2021-05-12 ENCOUNTER — Encounter (HOSPITAL_COMMUNITY): Payer: Self-pay | Admitting: Emergency Medicine

## 2021-05-12 ENCOUNTER — Emergency Department (HOSPITAL_COMMUNITY)
Admission: EM | Admit: 2021-05-12 | Discharge: 2021-05-13 | Disposition: A | Discharge status: Home / Self Care | Payer: Self-pay | Attending: Emergency Medicine | Admitting: Emergency Medicine

## 2021-05-12 DIAGNOSIS — M79645 Pain in left finger(s): Secondary | ICD-10-CM | POA: Insufficient documentation

## 2021-05-12 DIAGNOSIS — F1510 Other stimulant abuse, uncomplicated: Secondary | ICD-10-CM | POA: Insufficient documentation

## 2021-05-12 DIAGNOSIS — F1010 Alcohol abuse, uncomplicated: Secondary | ICD-10-CM | POA: Insufficient documentation

## 2021-05-12 DIAGNOSIS — X789XXA Intentional self-harm by unspecified sharp object, initial encounter: Secondary | ICD-10-CM | POA: Insufficient documentation

## 2021-05-12 DIAGNOSIS — Y906 Blood alcohol level of 120-199 mg/100 ml: Secondary | ICD-10-CM | POA: Insufficient documentation

## 2021-05-12 DIAGNOSIS — R45851 Suicidal ideations: Secondary | ICD-10-CM | POA: Insufficient documentation

## 2021-05-12 DIAGNOSIS — Z20822 Contact with and (suspected) exposure to covid-19: Secondary | ICD-10-CM | POA: Insufficient documentation

## 2021-05-12 DIAGNOSIS — F322 Major depressive disorder, single episode, severe without psychotic features: Secondary | ICD-10-CM | POA: Insufficient documentation

## 2021-05-12 DIAGNOSIS — S60042A Contusion of left ring finger without damage to nail, initial encounter: Secondary | ICD-10-CM | POA: Insufficient documentation

## 2021-05-12 DIAGNOSIS — S61512A Laceration without foreign body of left wrist, initial encounter: Secondary | ICD-10-CM | POA: Insufficient documentation

## 2021-05-12 LAB — COMPREHENSIVE METABOLIC PANEL
ALT: 23 U/L (ref 0–44)
AST: 23 U/L (ref 15–41)
Albumin: 4.1 g/dL (ref 3.5–5.0)
Alkaline Phosphatase: 53 U/L (ref 38–126)
Anion gap: 9 (ref 5–15)
BUN: 5 mg/dL — ABNORMAL LOW (ref 6–20)
CO2: 24 mmol/L (ref 22–32)
Calcium: 8.5 mg/dL — ABNORMAL LOW (ref 8.9–10.3)
Chloride: 110 mmol/L (ref 98–111)
Creatinine, Ser: 0.53 mg/dL (ref 0.44–1.00)
GFR, Estimated: >60 mL/min (ref >60)
Glucose, Bld: 91 mg/dL (ref 70–99)
Potassium: 3.7 mmol/L (ref 3.5–5.1)
Sodium: 143 mmol/L (ref 135–145)
Total Bilirubin: 0.3 mg/dL (ref 0.3–1.2)
Total Protein: 7.1 g/dL (ref 6.5–8.1)

## 2021-05-12 LAB — CBC
HCT: 43.6 % (ref 36.0–46.0)
Hemoglobin: 14.3 g/dL (ref 12.0–15.0)
MCH: 31.8 pg (ref 26.0–34.0)
MCHC: 32.8 g/dL (ref 30.0–36.0)
MCV: 96.9 fL (ref 80.0–100.0)
Platelets: 274 10*3/uL (ref 150–400)
RBC: 4.5 MIL/uL (ref 3.87–5.11)
RDW: 12.5 % (ref 11.5–15.5)
WBC: 6.4 10*3/uL (ref 4.0–10.5)
nRBC: 0 % (ref 0.0–0.2)

## 2021-05-12 LAB — RAPID URINE DRUG SCREEN, HOSP PERFORMED
Amphetamines: POSITIVE — AB
Barbiturates: NOT DETECTED
Benzodiazepines: NOT DETECTED
Cocaine: NOT DETECTED
Opiates: NOT DETECTED
Tetrahydrocannabinol: NOT DETECTED

## 2021-05-12 LAB — ACETAMINOPHEN LEVEL: Acetaminophen (Tylenol), Serum: <10 ug/mL — ABNORMAL LOW (ref 10–30)

## 2021-05-12 LAB — SALICYLATE LEVEL: Salicylate Lvl: <7 mg/dL — ABNORMAL LOW (ref 7.0–30.0)

## 2021-05-12 LAB — I-STAT BETA HCG BLOOD, ED (MC, WL, AP ONLY): I-stat hCG, quantitative: <5 m[IU]/mL (ref <5)

## 2021-05-12 LAB — ETHANOL: Alcohol, Ethyl (B): 196 mg/dL — ABNORMAL HIGH (ref <10)

## 2021-05-12 MED ORDER — ONDANSETRON HCL 4 MG PO TABS: 4.0000 mg | ORAL_TABLET | Freq: Three times a day (TID) | ORAL | Status: DC | PRN

## 2021-05-12 MED ORDER — ALUM & MAG HYDROXIDE-SIMETH 200-200-20 MG/5ML PO SUSP: 30.0000 mL | Freq: Four times a day (QID) | ORAL | Status: DC | PRN

## 2021-05-12 MED ORDER — HALOPERIDOL 5 MG PO TABS: 5.0000 mg | ORAL_TABLET | Freq: Four times a day (QID) | ORAL | Status: DC | PRN

## 2021-05-12 MED ORDER — BENZTROPINE MESYLATE 1 MG PO TABS
1.0000 mg | ORAL_TABLET | Freq: Four times a day (QID) | ORAL | Status: DC | PRN
  Filled 2021-05-12: qty 1

## 2021-05-12 MED ORDER — ACETAMINOPHEN 325 MG PO TABS: 650.0000 mg | ORAL_TABLET | ORAL | Status: DC | PRN

## 2021-05-12 MED ORDER — SERTRALINE HCL 100 MG PO TABS
100.0000 mg | ORAL_TABLET | Freq: Every day | ORAL | Status: DC
  Administered 2021-05-13: 100 mg via ORAL
  Filled 2021-05-12: qty 1

## 2021-05-12 MED ORDER — NICOTINE 21 MG/24HR TD PT24
21.0000 mg | MEDICATED_PATCH | Freq: Every day | TRANSDERMAL | Status: DC
  Administered 2021-05-13: 21 mg via TRANSDERMAL
  Filled 2021-05-12: qty 1

## 2021-05-12 MED ORDER — ZOLPIDEM TARTRATE 5 MG PO TABS: 5.0000 mg | ORAL_TABLET | Freq: Every evening | ORAL | Status: DC | PRN

## 2021-05-12 MED ORDER — GABAPENTIN 100 MG PO CAPS
100.0000 mg | ORAL_CAPSULE | Freq: Three times a day (TID) | ORAL | Status: DC
  Administered 2021-05-12 – 2021-05-13 (×2): 100 mg via ORAL
  Filled 2021-05-12 (×2): qty 1
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
## 2021-05-13 ENCOUNTER — Emergency Department (HOSPITAL_COMMUNITY): Payer: Self-pay

## 2021-05-13 LAB — RESP PANEL BY RT-PCR (FLU A&B, COVID) ARPGX2
Influenza A by PCR: NEGATIVE
Influenza B by PCR: NEGATIVE
SARS Coronavirus 2 by RT PCR: NEGATIVE

## 2021-05-13 MED ORDER — SERTRALINE HCL 100 MG PO TABS: 100.0000 mg | ORAL_TABLET | Freq: Every day | ORAL | 0 refills | Status: AC

## 2021-05-13 NOTE — ED Notes (Signed)
Pt belongings inventoried and placed in [facility] large locker #11. Copy of inventory sheet placed in [facility] folder at nurse's station.  ?

## 2021-05-13 NOTE — ED Notes (Signed)
IVC paperwork completed, Paperwork with Gwenlyn Perking, faxed to [facility] ?

## 2021-05-13 NOTE — ED Notes (Signed)
Pt has had her breakfast tray since around 7:10 this morning, this tech has tried multiple times to get her to eat, she continues to repeat "she'll eat it later". Pt resting comfortably at this time.  ?

## 2021-05-13 NOTE — ED Notes (Signed)
Ice water provided to pt per request.

## 2021-05-13 NOTE — ED Notes (Signed)
Report received. Pt sleeping. Pt placed at nurses station in h/w.  ?

## 2021-05-13 NOTE — ED Notes (Signed)
Pt was brought a lunch tray, she is sitting up in the bed eating at this time. Pt is pleasant and cooperative.  ?

## 2021-05-13 NOTE — ED Provider Notes (Signed)
36 year old female who was evaluated by behavioral health.  She has been cleared for discharge.  Her IVC has been rescinded ?She was complaining of pain in her hand x-rays obtained and noted with fracture noted. ?Sertraline prescription refilled ?  ?Margarita Grizzle, MD ?05/13/21 1503 ? ?

## 2021-05-13 NOTE — BH Assessment (Signed)
[facility] Assessment Progress Note ?  ?Per Shuvon Rankin, NP, this pt does not require psychiatric hospitalization at this time.  Pt is psychiatrically cleared.  Discharge instructions include referral information for [facility] for her behavioral health needs, and for [facility] and [facility] for primary care.  Pt's nurse, Christiane Ha, has been notified. ? ?Doylene Canning, MA ?Triage Specialist ?[phone] ? ?

## 2021-05-13 NOTE — ED Notes (Signed)
Pt alert, NAD, calm, interactive, soft spoken, denies pain, sob, nausea, dizziness, anxiousness, SI, HI, ETOH. Smoker, nicotine patch given. Took meds agreeably. Declined eating breakfast. Admits to going through ED/BH process before. Denies questions or needs.  ?

## 2021-05-13 NOTE — ED Notes (Addendum)
Pt alert, NAD, calm, interactive, resps e/u, speaking in clear complete sentences, steady gait to b/r, sitter present. Pending TTS.  ?

## 2021-05-13 NOTE — ED Notes (Signed)
Pt was discharged but the primary nurse had not administered her gabapentin that was due at 1600. Pt A&OX4 ambulatory at d/c with independent steady gait, NAD. Pt verbalized understanding of d/c instructions, prescriptions and follow up care. Pts belongings given back to patient (cell phone and clothing). ?

## 2021-05-13 NOTE — ED Notes (Signed)
D/c in process
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
## 2021-05-13 NOTE — ED Notes (Signed)
Pt awake and interactive, NAD, calm, sitter present, moved by stretcher to room 47 to begin TTS. ?

## 2021-05-13 NOTE — Discharge Instructions (Signed)
For your behavioral health needs you are advised to follow up with [facility].  They offer psychiatry, therapy and substance use disorder counseling.  New patients are seen at their walk-in clinic.  Walk-in hours are Monday - Friday from 8:30 am - 12:00 pm, and from 1:00 pm - 2:30 pm.  Walk-in patients are seen on a first come, first served basis, so try to arrive as early as possible for the best chance of being seen the same day: ? ?     [facility] ?     [address] ?     [place], [place] [address] ?     [phone]  ? ?For your primary care needs contact [facility]: ? ?     [facility] ?     [address] ?     [place], [place] [address]  ?     [phone]  ?

## 2021-05-13 NOTE — ED Notes (Signed)
Sandwich bag provided to pt ?

## 2021-05-13 NOTE — ED Notes (Signed)
Breakfast order placed ?

## 2021-10-06 ENCOUNTER — Emergency Department (HOSPITAL_COMMUNITY): Payer: Medicaid Other

## 2021-10-06 ENCOUNTER — Other Ambulatory Visit: Payer: Self-pay

## 2021-10-06 ENCOUNTER — Emergency Department (HOSPITAL_COMMUNITY)
Admission: EM | Admit: 2021-10-06 | Discharge: 2021-10-07 | Disposition: A | Discharge status: Home / Self Care | Payer: Medicaid Other | Attending: Emergency Medicine | Admitting: Emergency Medicine

## 2021-10-06 DIAGNOSIS — F1510 Other stimulant abuse, uncomplicated: Secondary | ICD-10-CM | POA: Insufficient documentation

## 2021-10-06 DIAGNOSIS — R7401 Elevation of levels of liver transaminase levels: Secondary | ICD-10-CM | POA: Insufficient documentation

## 2021-10-06 DIAGNOSIS — K029 Dental caries, unspecified: Secondary | ICD-10-CM | POA: Insufficient documentation

## 2021-10-06 DIAGNOSIS — Z79899 Other long term (current) drug therapy: Secondary | ICD-10-CM | POA: Insufficient documentation

## 2021-10-06 DIAGNOSIS — F1010 Alcohol abuse, uncomplicated: Secondary | ICD-10-CM | POA: Insufficient documentation

## 2021-10-06 DIAGNOSIS — F1910 Other psychoactive substance abuse, uncomplicated: Secondary | ICD-10-CM

## 2021-10-06 LAB — COMPREHENSIVE METABOLIC PANEL
ALT: 53 U/L — ABNORMAL HIGH (ref 0–44)
AST: 53 U/L — ABNORMAL HIGH (ref 15–41)
Albumin: 4.1 g/dL (ref 3.5–5.0)
Alkaline Phosphatase: 59 U/L (ref 38–126)
Anion gap: 11 (ref 5–15)
BUN: 6 mg/dL (ref 6–20)
CO2: 24 mmol/L (ref 22–32)
Calcium: 8.6 mg/dL — ABNORMAL LOW (ref 8.9–10.3)
Chloride: 104 mmol/L (ref 98–111)
Creatinine, Ser: 0.78 mg/dL (ref 0.44–1.00)
GFR, Estimated: >60 mL/min (ref >60)
Glucose, Bld: 75 mg/dL (ref 70–99)
Potassium: 3.7 mmol/L (ref 3.5–5.1)
Sodium: 139 mmol/L (ref 135–145)
Total Bilirubin: 0.4 mg/dL (ref 0.3–1.2)
Total Protein: 7.1 g/dL (ref 6.5–8.1)

## 2021-10-06 LAB — CBC
HCT: 42.4 % (ref 36.0–46.0)
Hemoglobin: 14.2 g/dL (ref 12.0–15.0)
MCH: 32.1 pg (ref 26.0–34.0)
MCHC: 33.5 g/dL (ref 30.0–36.0)
MCV: 95.7 fL (ref 80.0–100.0)
Platelets: 233 10*3/uL (ref 150–400)
RBC: 4.43 MIL/uL (ref 3.87–5.11)
RDW: 14.1 % (ref 11.5–15.5)
WBC: 7.1 10*3/uL (ref 4.0–10.5)
nRBC: 0 % (ref 0.0–0.2)

## 2021-10-06 LAB — RAPID URINE DRUG SCREEN, HOSP PERFORMED
Amphetamines: POSITIVE — AB
Barbiturates: NOT DETECTED
Benzodiazepines: NOT DETECTED
Cocaine: POSITIVE — AB
Opiates: NOT DETECTED
Tetrahydrocannabinol: NOT DETECTED

## 2021-10-06 LAB — I-STAT BETA HCG BLOOD, ED (MC, WL, AP ONLY): I-stat hCG, quantitative: <5 m[IU]/mL (ref <5)

## 2021-10-06 MED ORDER — THIAMINE HCL 100 MG PO TABS
100.0000 mg | ORAL_TABLET | Freq: Once | ORAL | Status: AC
  Administered 2021-10-06: 100 mg via ORAL
  Filled 2021-10-06: qty 1

## 2021-10-06 MED ORDER — LACTATED RINGERS IV BOLUS
1000.0000 mL | Freq: Once | INTRAVENOUS | Status: AC
  Administered 2021-10-06: 1000 mL via INTRAVENOUS

## 2021-10-06 NOTE — ED Triage Notes (Addendum)
This patient is arriving from home via EMS. Called for transport because she felt weak after using "ice" for the 1 st time today and consuming etOH (2 4Locos and "many" shots). Reports increased stress from family problems to EMS. Old, scabbed and healing lacerations to L neck and L wrist which pt notes as being from previous self harm. However, firmly denies being SI/HI or experiencing hallucinations at this time.  EMS vitals: Hr90bpm, CBG 86, 99% RA, 154/96, RR13  On exam, she is soft spoken with flat affect, difficult to gather information from at times d/t this

## 2021-10-06 NOTE — ED Triage Notes (Signed)
Pt able to ambulate to restroom ?

## 2021-10-06 NOTE — ED Provider Notes (Signed)
[facility] EMERGENCY DEPARTMENT Provider Note   CSN: [id number] Arrival date & time: 10/06/21  2227     History {Add pertinent medical, surgical, social history, OB history to HPI:1} Chief Complaint  Patient presents with   Drug / Alcohol Assessment    Monique Lyons is a 36 y.o. female who presents for EMS for reports of feeling weak and "weird" after using ice for the first time.  States she was always methamphetamine prior to today.  Spoke to today.  Does endorse extensive history of alcohol use in the past with current history of cirrhosis per patient.  States that her father has stage IV pancreatic cancer and she recently went through a break-up.  These social stressors have contributed to her recent bender with alcohol and current use of methamphetamine.  Patient does endorse recent thoughts of self-harm noted by abrasions to left wrist and neck but adamantly denies any suicidality or homicidality today.  Denies any AVH.  Patient very soft spoken on exam.  History of generalized anxiety disorder and reported suicide attempt in the past by cutting wrist.  He is not on any medications daily.  HPI     Home Medications Prior to Admission medications   Medication Sig Start Date End Date Taking? Authorizing Provider  gabapentin (NEURONTIN) 100 MG capsule Take 1 capsule (100 mg total) by mouth 3 (three) times daily. Patient taking differently: Take 100 mg by mouth 2 (two) times daily as needed (nerve pain). 05/06/21   Nkwenti, Tyler Aas, NP  nicotine (NICODERM CQ - DOSED IN MG/24 HOURS) 14 mg/24hr patch Place 1 patch (14 mg total) onto the skin daily. 05/07/21   Starleen Blue, NP  sertraline (ZOLOFT) 100 MG tablet Take 1 tablet (100 mg total) by mouth daily. 05/13/21   Margarita Grizzle, MD      Allergies    Amoxicillin and Penicillins    Review of Systems   Review of Systems  Constitutional:  Positive for fatigue.  HENT: Negative.    Respiratory:  Positive for chest  tightness and shortness of breath.   Cardiovascular: Negative.   Gastrointestinal: Negative.   Musculoskeletal: Negative.   Neurological:  Positive for weakness.  Psychiatric/Behavioral:  Positive for dysphoric mood. Negative for suicidal ideas.     Physical Exam Updated Vital Signs BP (!) 146/95 (BP Location: Right Arm)   Pulse 95   Temp 97.6 F (36.4 C) (Oral)   Resp 14   Wt 68 kg   SpO2 95%   BMI 22.14 kg/m  Physical Exam Vitals and nursing note reviewed.  Constitutional:      Appearance: She is not ill-appearing or toxic-appearing.     Comments: Very soft spoken, does not want to look this provider in the face.   HENT:     Head: Normocephalic and atraumatic.     Mouth/Throat:     Mouth: Mucous membranes are moist.     Dentition: Abnormal dentition. Dental caries present.     Pharynx: Oropharynx is clear. Uvula midline. No oropharyngeal exudate or posterior oropharyngeal erythema.  Eyes:     General: Lids are normal. Vision grossly intact.        Right eye: No discharge.        Left eye: No discharge.     Extraocular Movements: Extraocular movements intact.     Conjunctiva/sclera: Conjunctivae normal.     Pupils: Pupils are equal, round, and reactive to light.  Neck:     Trachea: Trachea and phonation normal.  Cardiovascular:     Rate and Rhythm: Normal rate and regular rhythm.     Pulses: Normal pulses.     Heart sounds: Normal heart sounds. No murmur heard. Pulmonary:     Effort: Pulmonary effort is normal. No tachypnea, bradypnea, accessory muscle usage, prolonged expiration or respiratory distress.     Breath sounds: Normal breath sounds. No wheezing or rales.  Chest:     Chest wall: No mass, lacerations, deformity, swelling, tenderness, crepitus or edema.  Abdominal:     General: Bowel sounds are normal. There is no distension.     Palpations: Abdomen is soft.     Tenderness: There is no abdominal tenderness. There is no right CVA tenderness, left CVA  tenderness, guarding or rebound.  Musculoskeletal:        General: No deformity.     Cervical back: Normal range of motion and neck supple.     Right lower leg: No edema.     Left lower leg: No edema.  Lymphadenopathy:     Cervical: No cervical adenopathy.  Skin:    General: Skin is warm and dry.     Capillary Refill: Capillary refill takes less than 2 seconds.       Neurological:     General: No focal deficit present.     Mental Status: She is alert and oriented to person, place, and time. Mental status is at baseline.     Sensory: Sensation is intact.     Motor: Motor function is intact.  Psychiatric:        Mood and Affect: Mood normal. Affect is flat.        Behavior: Behavior is withdrawn.        Thought Content: Thought content does not include homicidal or suicidal ideation.     Comments: Does not appear to be responding to internal stimuli at this time.      ED Results / Procedures / Treatments   Labs (all labs ordered are listed, but only abnormal results are displayed) Labs Reviewed  COMPREHENSIVE METABOLIC PANEL  ETHANOL  CBC  RAPID URINE DRUG SCREEN, HOSP PERFORMED  I-STAT BETA HCG BLOOD, ED ([facility], [facility], [facility] ONLY)    EKG None  Radiology No results found.  Procedures Procedures  {Document cardiac monitor, telemetry assessment procedure when appropriate:1}  Medications Ordered in ED Medications  lactated ringers bolus 1,000 mL (has no administration in time range)  thiamine (VITAMIN B1) tablet 100 mg (has no administration in time range)    ED Course/ Medical Decision Making/ A&P                           Medical Decision Making 36 year old female presents with acute intoxication of alcohol and methamphetamine.  Hypertensive intake, vital signs otherwise normal.  Cardiopulmonary abdominal exams are benign.  Patient neurovascular intact all extremities.  Withdrawn behavior and flat affect, very soft spoken.  Adamantly denies any suicidality today or  thoughts of self-harm.  States recent bender with substance use has been secondary to social stressors with his ill father.    Amount and/or Complexity of Data Reviewed Labs: ordered. Radiology: ordered.  Risk OTC drugs.   ***  {Document critical care time when appropriate:1} {Document review of labs and clinical decision tools ie heart score, Chads2Vasc2 etc:1}  {Document your independent review of radiology images, and any outside records:1} {Document your discussion with family members, caretakers, and with consultants:1} {Document social determinants of health affecting  pt's care:1} {Document your decision making why or why not admission, treatments were needed:1} Final Clinical Impression(s) / ED Diagnoses Final diagnoses:  None    Rx / DC Orders ED Discharge Orders     None

## 2021-10-07 MED ORDER — ALBUTEROL SULFATE HFA 108 (90 BASE) MCG/ACT IN AERS
2.0000 | INHALATION_SPRAY | Freq: Once | RESPIRATORY_TRACT | Status: AC
  Administered 2021-10-07: 2 via RESPIRATORY_TRACT
  Filled 2021-10-07: qty 6.7

## 2021-10-07 NOTE — Discharge Instructions (Addendum)
You were seen in the ER today after using methamphetamine. Your physical exam, blood work, and chest xray were reassuring. Please stop using recreational drugs, as these can be very dangerous for your. You may use the provided resources attached for assistance in getting clean and sober. Return to the ER with any new severe symptoms.

## 2021-10-07 NOTE — ED Notes (Signed)
Phone provided for patient to start looking for a ride. Successful PO challenge.
